# Patient Record
Sex: Female | Born: 1965
Health system: Southern US, Community
[De-identification: ages and names within clinical notes are randomized; demographics above are authoritative.]

## PROBLEM LIST (undated history)

## (undated) DIAGNOSIS — S83519A Sprain of anterior cruciate ligament of unspecified knee, initial encounter: Secondary | ICD-10-CM

## (undated) DIAGNOSIS — E785 Hyperlipidemia, unspecified: Secondary | ICD-10-CM

## (undated) DIAGNOSIS — F419 Anxiety disorder, unspecified: Secondary | ICD-10-CM

## (undated) DIAGNOSIS — Z98811 Dental restoration status: Secondary | ICD-10-CM

## (undated) HISTORY — PX: NASAL RECONSTRUCTION WITH SEPTAL REPAIR: SHX5665

## (undated) HISTORY — DX: Anxiety disorder, unspecified: F41.9

## (undated) HISTORY — PX: STRABISMUS SURGERY: SHX218

## (undated) HISTORY — PX: ABDOMINAL HYSTERECTOMY: SHX81

---

## 1998-04-13 ENCOUNTER — Emergency Department (HOSPITAL_COMMUNITY): Admission: EM | Admit: 1998-04-13 | Discharge: 1998-04-13 | Payer: Self-pay | Admitting: Emergency Medicine

## 1999-03-12 ENCOUNTER — Other Ambulatory Visit: Admission: RE | Admit: 1999-03-12 | Discharge: 1999-03-12 | Payer: Self-pay | Admitting: Urology

## 1999-07-07 ENCOUNTER — Ambulatory Visit (HOSPITAL_COMMUNITY): Admission: RE | Admit: 1999-07-07 | Discharge: 1999-07-07 | Payer: Self-pay | Admitting: Obstetrics and Gynecology

## 1999-07-07 ENCOUNTER — Encounter: Payer: Self-pay | Admitting: Obstetrics and Gynecology

## 1999-08-04 ENCOUNTER — Other Ambulatory Visit: Admission: RE | Admit: 1999-08-04 | Discharge: 1999-08-04 | Payer: Self-pay | Admitting: Obstetrics and Gynecology

## 2000-06-30 ENCOUNTER — Other Ambulatory Visit: Admission: RE | Admit: 2000-06-30 | Discharge: 2000-06-30 | Payer: Self-pay | Admitting: Obstetrics and Gynecology

## 2000-12-28 ENCOUNTER — Encounter (INDEPENDENT_AMBULATORY_CARE_PROVIDER_SITE_OTHER): Payer: Self-pay | Admitting: Specialist

## 2000-12-28 ENCOUNTER — Ambulatory Visit (HOSPITAL_COMMUNITY): Admission: RE | Admit: 2000-12-28 | Discharge: 2000-12-28 | Payer: Self-pay | Admitting: Urology

## 2001-07-07 ENCOUNTER — Other Ambulatory Visit: Admission: RE | Admit: 2001-07-07 | Discharge: 2001-07-07 | Payer: Self-pay | Admitting: Obstetrics and Gynecology

## 2002-06-01 ENCOUNTER — Ambulatory Visit (HOSPITAL_COMMUNITY): Admission: RE | Admit: 2002-06-01 | Discharge: 2002-06-01 | Payer: Self-pay | Admitting: Obstetrics and Gynecology

## 2002-06-01 ENCOUNTER — Encounter: Payer: Self-pay | Admitting: Obstetrics and Gynecology

## 2002-07-21 ENCOUNTER — Other Ambulatory Visit: Admission: RE | Admit: 2002-07-21 | Discharge: 2002-07-21 | Payer: Self-pay | Admitting: Obstetrics and Gynecology

## 2003-01-26 ENCOUNTER — Encounter: Admission: RE | Admit: 2003-01-26 | Discharge: 2003-03-02 | Payer: Self-pay | Admitting: Orthopedic Surgery

## 2003-02-11 ENCOUNTER — Encounter: Payer: Self-pay | Admitting: Orthopedic Surgery

## 2003-02-11 ENCOUNTER — Ambulatory Visit (HOSPITAL_COMMUNITY): Admission: RE | Admit: 2003-02-11 | Discharge: 2003-02-11 | Payer: Self-pay | Admitting: Orthopedic Surgery

## 2003-04-11 ENCOUNTER — Encounter: Payer: Self-pay | Admitting: Emergency Medicine

## 2003-04-11 ENCOUNTER — Emergency Department (HOSPITAL_COMMUNITY): Admission: EM | Admit: 2003-04-11 | Discharge: 2003-04-11 | Payer: Self-pay | Admitting: *Deleted

## 2003-06-25 ENCOUNTER — Encounter (INDEPENDENT_AMBULATORY_CARE_PROVIDER_SITE_OTHER): Payer: Self-pay | Admitting: Specialist

## 2003-06-25 ENCOUNTER — Inpatient Hospital Stay (HOSPITAL_COMMUNITY): Admission: RE | Admit: 2003-06-25 | Discharge: 2003-06-27 | Payer: Self-pay | Admitting: Obstetrics and Gynecology

## 2003-06-25 HISTORY — PX: TOTAL ABDOMINAL HYSTERECTOMY W/ BILATERAL SALPINGOOPHORECTOMY: SHX83

## 2003-08-28 ENCOUNTER — Encounter: Admission: RE | Admit: 2003-08-28 | Discharge: 2003-08-28 | Payer: Self-pay | Admitting: Obstetrics and Gynecology

## 2003-09-03 ENCOUNTER — Encounter (HOSPITAL_COMMUNITY): Admission: RE | Admit: 2003-09-03 | Discharge: 2003-09-03 | Payer: Self-pay | Admitting: Obstetrics and Gynecology

## 2004-01-13 ENCOUNTER — Emergency Department (HOSPITAL_COMMUNITY): Admission: EM | Admit: 2004-01-13 | Discharge: 2004-01-13 | Payer: Self-pay | Admitting: *Deleted

## 2004-10-13 ENCOUNTER — Emergency Department (HOSPITAL_COMMUNITY): Admission: EM | Admit: 2004-10-13 | Discharge: 2004-10-13 | Payer: Self-pay | Admitting: Emergency Medicine

## 2004-10-27 ENCOUNTER — Encounter: Admission: RE | Admit: 2004-10-27 | Discharge: 2004-10-27 | Payer: Self-pay | Admitting: Obstetrics and Gynecology

## 2004-11-04 ENCOUNTER — Other Ambulatory Visit: Admission: RE | Admit: 2004-11-04 | Discharge: 2004-11-04 | Payer: Self-pay | Admitting: Obstetrics and Gynecology

## 2005-10-28 ENCOUNTER — Encounter: Admission: RE | Admit: 2005-10-28 | Discharge: 2005-10-28 | Payer: Self-pay | Admitting: Occupational Medicine

## 2005-10-29 ENCOUNTER — Ambulatory Visit (HOSPITAL_COMMUNITY): Admission: RE | Admit: 2005-10-29 | Discharge: 2005-10-29 | Payer: Self-pay | Admitting: Obstetrics and Gynecology

## 2005-11-09 ENCOUNTER — Other Ambulatory Visit: Admission: RE | Admit: 2005-11-09 | Discharge: 2005-11-09 | Payer: Self-pay | Admitting: Obstetrics and Gynecology

## 2006-02-26 ENCOUNTER — Emergency Department (HOSPITAL_COMMUNITY): Admission: EM | Admit: 2006-02-26 | Discharge: 2006-02-26 | Payer: Self-pay | Admitting: Emergency Medicine

## 2006-09-08 ENCOUNTER — Encounter: Admission: RE | Admit: 2006-09-08 | Discharge: 2006-09-08 | Payer: Self-pay | Admitting: Obstetrics and Gynecology

## 2006-09-15 ENCOUNTER — Ambulatory Visit (HOSPITAL_COMMUNITY): Admission: RE | Admit: 2006-09-15 | Discharge: 2006-09-15 | Payer: Self-pay | Admitting: Obstetrics and Gynecology

## 2006-11-11 ENCOUNTER — Other Ambulatory Visit: Admission: RE | Admit: 2006-11-11 | Discharge: 2006-11-11 | Payer: Self-pay | Admitting: Obstetrics and Gynecology

## 2007-01-30 ENCOUNTER — Emergency Department (HOSPITAL_COMMUNITY): Admission: EM | Admit: 2007-01-30 | Discharge: 2007-01-30 | Payer: Self-pay | Admitting: Emergency Medicine

## 2007-05-13 ENCOUNTER — Ambulatory Visit (HOSPITAL_COMMUNITY): Admission: RE | Admit: 2007-05-13 | Discharge: 2007-05-13 | Payer: Self-pay | Admitting: Family Medicine

## 2007-06-03 ENCOUNTER — Ambulatory Visit (HOSPITAL_COMMUNITY): Admission: RE | Admit: 2007-06-03 | Discharge: 2007-06-03 | Payer: Self-pay | Admitting: Family Medicine

## 2007-06-06 ENCOUNTER — Emergency Department (HOSPITAL_COMMUNITY): Admission: EM | Admit: 2007-06-06 | Discharge: 2007-06-06 | Payer: Self-pay | Admitting: Family Medicine

## 2007-11-02 ENCOUNTER — Encounter: Admission: RE | Admit: 2007-11-02 | Discharge: 2007-11-02 | Payer: Self-pay | Admitting: Obstetrics and Gynecology

## 2007-11-11 ENCOUNTER — Encounter: Admission: RE | Admit: 2007-11-11 | Discharge: 2007-11-11 | Payer: Self-pay | Admitting: Obstetrics and Gynecology

## 2007-11-16 ENCOUNTER — Other Ambulatory Visit: Admission: RE | Admit: 2007-11-16 | Discharge: 2007-11-16 | Payer: Self-pay | Admitting: Obstetrics and Gynecology

## 2007-11-30 ENCOUNTER — Ambulatory Visit (HOSPITAL_COMMUNITY): Admission: RE | Admit: 2007-11-30 | Discharge: 2007-11-30 | Payer: Self-pay | Admitting: Obstetrics and Gynecology

## 2008-12-03 ENCOUNTER — Ambulatory Visit: Payer: Self-pay | Admitting: Obstetrics and Gynecology

## 2008-12-03 ENCOUNTER — Encounter: Payer: Self-pay | Admitting: Obstetrics and Gynecology

## 2008-12-03 ENCOUNTER — Other Ambulatory Visit: Admission: RE | Admit: 2008-12-03 | Discharge: 2008-12-03 | Payer: Self-pay | Admitting: Obstetrics and Gynecology

## 2008-12-03 ENCOUNTER — Encounter: Admission: RE | Admit: 2008-12-03 | Discharge: 2008-12-03 | Payer: Self-pay | Admitting: Obstetrics and Gynecology

## 2008-12-07 ENCOUNTER — Encounter: Admission: RE | Admit: 2008-12-07 | Discharge: 2008-12-07 | Payer: Self-pay | Admitting: Obstetrics and Gynecology

## 2009-04-02 ENCOUNTER — Encounter (INDEPENDENT_AMBULATORY_CARE_PROVIDER_SITE_OTHER): Payer: Self-pay | Admitting: Gastroenterology

## 2009-04-02 ENCOUNTER — Ambulatory Visit (HOSPITAL_COMMUNITY): Admission: RE | Admit: 2009-04-02 | Discharge: 2009-04-02 | Payer: Self-pay | Admitting: Gastroenterology

## 2009-06-02 ENCOUNTER — Emergency Department (HOSPITAL_COMMUNITY): Admission: EM | Admit: 2009-06-02 | Discharge: 2009-06-02 | Payer: Self-pay | Admitting: Family Medicine

## 2009-10-09 ENCOUNTER — Encounter: Admission: RE | Admit: 2009-10-09 | Discharge: 2009-10-09 | Payer: Self-pay | Admitting: Obstetrics and Gynecology

## 2009-10-09 ENCOUNTER — Emergency Department (HOSPITAL_COMMUNITY): Admission: EM | Admit: 2009-10-09 | Discharge: 2009-10-09 | Payer: Self-pay | Admitting: Family Medicine

## 2009-12-04 ENCOUNTER — Ambulatory Visit: Payer: Self-pay | Admitting: Obstetrics and Gynecology

## 2009-12-04 ENCOUNTER — Other Ambulatory Visit: Admission: RE | Admit: 2009-12-04 | Discharge: 2009-12-04 | Payer: Self-pay | Admitting: Obstetrics and Gynecology

## 2009-12-06 ENCOUNTER — Ambulatory Visit: Payer: Self-pay | Admitting: Obstetrics and Gynecology

## 2010-01-15 ENCOUNTER — Ambulatory Visit: Payer: Self-pay | Admitting: Obstetrics and Gynecology

## 2010-07-13 ENCOUNTER — Encounter: Payer: Self-pay | Admitting: Obstetrics and Gynecology

## 2010-07-14 ENCOUNTER — Encounter: Payer: Self-pay | Admitting: Obstetrics and Gynecology

## 2010-11-07 NOTE — Discharge Summary (Signed)
Nicole Oliver, Nicole Oliver                          ACCOUNT NO.:  [id number]   MEDICAL RECORD NO.:  [id number]                   PATIENT TYPE:  INP   LOCATION:  9136                                 FACILITY:  [facility]   PHYSICIAN:  Daniel L. Eda Paschal, M.D.           DATE OF BIRTH:  01-22-1966   DATE OF ADMISSION:  06/25/2003  DATE OF DISCHARGE:  06/27/2003                                 DISCHARGE SUMMARY   DISCHARGE DIAGNOSES:  1. Fibroids.  2. Menorrhagia.  3. Right adnexal mass.  4. Endometriosis.  5. Status post total abdominal hysterectomy with bilateral salpingo-     oophorectomy with Dr. Edyth Gunnels on June 25, 2003.   HISTORY:  This is a 37-years-of-age female gravida 0 who presented to the  office in November 2004 with a history of abdominal bloating pain, extremely  heavy period.  Examination revealed she had fibroids which had appeared to  be enlarged on previous exam and a complex right adnexal mass of 2.9 x 2.4 x  2.2 cm that was cystic and solid.  CA125 was noted to be normal.  Because of  her complex mass and her family history of ovarian cancer as pain and  menorrhagia with fibroids she entered the hospital for definitive surgery.  In addition, because of her family history of ovarian cancer she elected to  have a bilateral salpingo-oophorectomy rather than just an ovarian  cystectomy.  Her family history note was significant for that her mother,  sister, and maternal aunt have all had breast cancer and her mother had also  had ovarian cancer.   HOSPITAL COURSE:  On June 25, 2003 the patient underwent a total abdominal  hysterectomy and bilateral salpingo-oophorectomy and it was noted that the  patient's uterus was enlarged with multiple fibroids.  One of them extended  into the area of the adnexa which may have been confused for an adnexal mass  on ultrasound.  It was noted there was endometriosis on the peritoneum.  Postoperatively the patient remained afebrile,  voiding, in stable condition.  She was discharged to home on June 27, 2003 and given discharge  instructions.   ACCESSORY CLINICAL FINDINGS/LABORATORY DATA:  On admission on June 21, 2003 hemoglobin was 13.6.   DISPOSITION:  1. The patient is discharged to home.  2. Given Darvocet p.r.n. pain.  3. Given Climara patch.  4. She was to follow up in the office on June 29, 2003 for staple removal.     Susa Loffler, P.A.                    Daniel L. Eda Paschal, M.D.    Ardath Sax  D:  07/23/2003  T:  07/23/2003  Job:  [id number]

## 2010-11-07 NOTE — H&P (Signed)
Nicole Oliver, Nicole Oliver                          ACCOUNT NO.:  [id number]   MEDICAL RECORD NO.:  [id number]                   PATIENT TYPE:  INP   LOCATION:                                       [facility]  [facility]   PHYSICIAN:  Daniel L. Eda Paschal, M.D.           DATE OF BIRTH:  [birth year]   DATE OF ADMISSION:  DATE OF DISCHARGE:                                HISTORY & PHYSICAL   CHIEF COMPLAINT:  Symptomatic fibroids with right adnexal mass.   HISTORY OF PRESENT ILLNESS:  The patient is a 45 year old nulla gravida who  presented to the office in November 2004, with a history of  abdominal  bloating, pelvic pain and extremely heavy periods. On examination  her  fibroids, which had been present previously, appeared  to be enlarged, and  in addition she had a complex right adnexal mass of about 2.9 x 2.4 x 2.2  cm. It is both cystic and solid. A CA-125 was done which was normal.  However, because of this complex mass and her family history of ovarian  cancer as well as pain and menorrhagia from her fibroids, she now enters the  hospital  for surgery. The patient has decided she does not want to have any  children at this point and would like to proceed with definitive surgery  because of the above.   In addition because of her family history of ovarian cancer, she has elected  to have a bilateral salpingo-oophorectomy rather than just having the  ovarian cyst removed. She will therefore undergo total abdominal  hysterectomy and bilateral salpingo-oophorectomy. She appreciates the issues  and need for hormone replacement therapy postoperatively.   CURRENT MEDICATIONS:  Birth control pills and Lexapro.   ALLERGIES:  CODEINE AND SULFA.   PAST SURGICAL HISTORY:  She has had no surgery except for a nasal septum  reconstruction and eye surgery.   FAMILY HISTORY:  Her maternal grandmother has hypertension. Her mother,  sister and maternal aunt have all had breast cancer. Her mother has had  ovarian cancer.   SOCIAL HISTORY:  She used to smoke but has given it up. She does not drink.   REVIEW OF SYSTEMS:  HEENT:  Negative.  CARDIAC:  Negative. RESPIRATORY:  Negative. GI:  Negative. GU:  Negative. NEUROMUSCULAR:  Negative. ENDOCRINE:  Negative.   PHYSICAL EXAMINATION:  GENERAL:  The patient is a well developed, well  nourished female in no acute distress.  VITAL SIGNS:  Blood pressure 130/70, pulse 80 and regular, respirations 16  and unlabored, afebrile.  HEENT:  All within normal limits.  NECK:  Supple, trachea was midline, thyroid not enlarged.  LUNGS:  Clear to auscultation and percussion.  HEART:  No thrills, heaves or murmurs.  BREASTS:  No masses.  ABDOMEN:  Soft without rebound or guarding or masses.  PELVIC:  External is normal. BUS is normal. Vaginal is normal. Cervix is  clean.  Pap smear shows no atypia. The uterus is enlarged by fibroids to 10  to 11 week size. Adnexa reveals a right adnexal enlargement of 4+ cm. The  left adnexa is negative. Rectovaginal is confirmatory.   IMPRESSION:  1. Symptomatic fibroids.  2. Complex right ovarian mass.  3. Strong family history of both breast and ovarian cancer.   PLAN:  Total abdominal hysterectomy and bilateral salpingo-oophorectomy for  above.                                               Daniel L. Eda Paschal, M.D.    Tonette Bihari  D:  06/24/2003  T:  06/24/2003  Job:  [id number]

## 2010-11-07 NOTE — Op Note (Signed)
Nicole Oliver, Nicole Oliver                          ACCOUNT NO.:  [id number]   MEDICAL RECORD NO.:  [id number]                   PATIENT TYPE:  INP   LOCATION:  9136                                 FACILITY:  [facility]   PHYSICIAN:  Daniel L. Eda Paschal, M.D.           DATE OF BIRTH:  30-Jul-1965   DATE OF PROCEDURE:  06/25/2003  DATE OF DISCHARGE:                                 OPERATIVE REPORT   PREOPERATIVE DIAGNOSES:  1. Fibroids.  2. Menorrhagia.  3. Right adnexal mass.   POSTOPERATIVE DIAGNOSES:  1. Fibroids.  2. Menorrhagia.  3. Endometriosis.   PROCEDURE:  Total abdominal hysterectomy and bilateral salpingo-  oophorectomy.   SURGEON:  Daniel L. Eda Paschal, M.D.   FIRST ASSISTANT:  Timothy P. Fontaine, M.D.   FINDINGS:  At the time of surgery, the patient's uterus was enlarged by  multiple fibroids.  One of them extended into the area of the right adnexa  and may have been confused for an adnexal mass on ultrasound.  Ovaries and  fallopian tubes themselves were normal.  Pelvic peritoneum was free of  disease, except the vesicouterine fold.  The peritoneum had some fairly  significant endometriosis.  Ileocecal junction was identified, appendix was  normal.  Exploration of the upper abdomen was normal.   DESCRIPTION OF PROCEDURE:  After adequate general endotracheal anesthesia,  the patient was placed in the supine position, prepped and draped in usual  sterile manner.  Foley catheter was returned to the patient's bladder.  A  Pfannenstiel incision was made.  The fascia was opened transversely.  The  perineum was entered vertically. Subcutaneous bleeders were clamped and  bovied as encountered.  When the peritoneal cavity was opened, the above  findings were noted.  The first perineal washings were obtained because of  her mother's history of ovarian cancer.  Then the adnexa were inspected and  what was felt to be a preoperative adnexal mass was gone.  It either  disappeared  since the ultrasound or it was confused with a fibroid that  extended into the right adnexa.  The decision was still to remove both  ovaries because of her mother's history of ovarian cancer plus an extremely  strong history of breast cancer. The round ligaments were bovied and cut.  The ureters were identified.  The IP ligaments were then clamped, cut, and  doubly suture ligated with #1 chromic catgut.  Posterior peritoneum and  vesicouterine fold of peritoneum were sharply dissected free.  The uterine  arteries were clamped, cut, and doubly suture ligated with #1 chromic  catgut.  The parametrium was taken down successive bites with clamping,  cutting and suture ligating with #1 chromic catgut.  Cervicovaginal junction  was identified, entered with sharp dissection.  Uterus, ovaries and tubes  were sent to pathology for tissue diagnosis with special instructions to  pathologist to serial cut her ovaries.  Angle sutures were placed in the  angles of the vagina incorporating the parametrium and uterosacral ligaments  with good vault support and then the cuff was closed with figure-of-eights  of 0 Vicryl.  Two Sponge and needle counts were correct.  The peritoneal  cavity was irrigated with copious amount of Ringers lactate which was then  removed.  The peritoneum was closed with a running 0 Vicryl. Subfascial and  superfascial spaces were copiously irrigated with  sterile saline and then the fascia was closed with two running 0 Vicryl.  The skin was closed with staples.  Estimated blood loss for the entire  procedure was 100 mL with none replaced.  The patient tolerated the  procedure well and left the operating room in satisfactory condition  draining clear urine from her Foley catheter.                                               Daniel L. Eda Paschal, M.D.    Tonette Bihari  D:  06/25/2003  T:  06/25/2003  Job:  [id number]

## 2011-01-05 ENCOUNTER — Other Ambulatory Visit: Payer: Self-pay | Admitting: Obstetrics and Gynecology

## 2011-01-05 DIAGNOSIS — Z1231 Encounter for screening mammogram for malignant neoplasm of breast: Secondary | ICD-10-CM

## 2011-01-12 ENCOUNTER — Ambulatory Visit: Payer: Self-pay

## 2011-01-19 ENCOUNTER — Ambulatory Visit
Admission: RE | Admit: 2011-01-19 | Discharge: 2011-01-19 | Disposition: A | Discharge status: Home / Self Care | Payer: Commercial Managed Care - PPO | Source: Ambulatory Visit | Attending: Obstetrics and Gynecology | Admitting: Obstetrics and Gynecology

## 2011-01-19 DIAGNOSIS — Z1231 Encounter for screening mammogram for malignant neoplasm of breast: Secondary | ICD-10-CM

## 2011-01-21 ENCOUNTER — Encounter: Payer: Self-pay | Admitting: Gynecology

## 2011-01-28 ENCOUNTER — Encounter: Payer: Self-pay | Admitting: Obstetrics and Gynecology

## 2011-01-28 ENCOUNTER — Other Ambulatory Visit (HOSPITAL_COMMUNITY)
Admission: RE | Admit: 2011-01-28 | Discharge: 2011-01-28 | Disposition: A | Discharge status: Home / Self Care | Payer: 59 | Source: Ambulatory Visit | Attending: Obstetrics and Gynecology | Admitting: Obstetrics and Gynecology

## 2011-01-28 ENCOUNTER — Ambulatory Visit (INDEPENDENT_AMBULATORY_CARE_PROVIDER_SITE_OTHER): Payer: Commercial Managed Care - PPO | Admitting: Obstetrics and Gynecology

## 2011-01-28 VITALS — BP 120/74 | Ht 64.0 in | Wt 175.0 lb

## 2011-01-28 DIAGNOSIS — Z780 Asymptomatic menopausal state: Secondary | ICD-10-CM

## 2011-01-28 DIAGNOSIS — B3731 Acute candidiasis of vulva and vagina: Secondary | ICD-10-CM

## 2011-01-28 DIAGNOSIS — N951 Menopausal and female climacteric states: Secondary | ICD-10-CM

## 2011-01-28 DIAGNOSIS — Z01419 Encounter for gynecological examination (general) (routine) without abnormal findings: Secondary | ICD-10-CM | POA: Insufficient documentation

## 2011-01-28 DIAGNOSIS — B373 Candidiasis of vulva and vagina: Secondary | ICD-10-CM

## 2011-01-28 DIAGNOSIS — N898 Other specified noninflammatory disorders of vagina: Secondary | ICD-10-CM

## 2011-01-28 MED ORDER — FLUCONAZOLE 200 MG PO TABS: 200.0000 mg | ORAL_TABLET | Freq: Every day | ORAL | Status: DC

## 2011-01-28 MED ORDER — ESTRADIOL 0.1 MG/24HR TD PTTW: 1.0000 | MEDICATED_PATCH | TRANSDERMAL | Status: DC

## 2011-01-28 NOTE — Progress Notes (Signed)
The patient came to see me today for her annual gynecological exam. She remains on hormone replacement with excellent results. She is up-to-date on mammograms and bone densities. She does her lab work from her PCP. She is aware of a vaginal discharge.  Physical examination: HEENT within normal limits. Neck: Thyroid not large. No masses. Supraclavicular nodes: not enlarged. Breasts: Examined in both sitting midline position. No skin changes and no masses. Abdomen: Soft no guarding rebound or masses or hernia. Pelvic: External: Within normal limits. BUS: Within normal limits. Vaginal:within normal limits. Positive wet prep for yeast. Good estrogen effect. No evidence of cystocele rectocele or enterocele. Cervixand uterus absent.. Adnexa: No masses. Rectovaginal exam: Confirmatory and negative. Extremities: Within normal limits.  Assessment: 1. Menopausal symptoms 2. Yeast vaginitis  Plan: 1. To reduce risk of DVT we switched her to Vivelle dot patch 0.1 mg BIW  2. We treated her with Diflucan 200 mg tablets daily for 4 days.

## 2011-02-06 ENCOUNTER — Ambulatory Visit: Payer: PRIVATE HEALTH INSURANCE | Attending: Family Medicine | Admitting: Occupational Therapy

## 2011-02-06 DIAGNOSIS — M2550 Pain in unspecified joint: Secondary | ICD-10-CM | POA: Insufficient documentation

## 2011-02-06 DIAGNOSIS — R209 Unspecified disturbances of skin sensation: Secondary | ICD-10-CM | POA: Insufficient documentation

## 2011-02-06 DIAGNOSIS — IMO0001 Reserved for inherently not codable concepts without codable children: Secondary | ICD-10-CM | POA: Insufficient documentation

## 2011-02-06 DIAGNOSIS — M6281 Muscle weakness (generalized): Secondary | ICD-10-CM | POA: Insufficient documentation

## 2011-02-06 DIAGNOSIS — M25649 Stiffness of unspecified hand, not elsewhere classified: Secondary | ICD-10-CM | POA: Insufficient documentation

## 2011-02-06 DIAGNOSIS — G5600 Carpal tunnel syndrome, unspecified upper limb: Secondary | ICD-10-CM | POA: Insufficient documentation

## 2011-02-06 DIAGNOSIS — M25549 Pain in joints of unspecified hand: Secondary | ICD-10-CM | POA: Insufficient documentation

## 2011-02-09 ENCOUNTER — Encounter: Payer: Self-pay | Admitting: Occupational Therapy

## 2011-02-13 ENCOUNTER — Telehealth: Payer: Self-pay | Admitting: *Deleted

## 2011-02-13 ENCOUNTER — Ambulatory Visit: Payer: PRIVATE HEALTH INSURANCE | Attending: Family Medicine | Admitting: Occupational Therapy

## 2011-02-13 DIAGNOSIS — M25549 Pain in joints of unspecified hand: Secondary | ICD-10-CM | POA: Insufficient documentation

## 2011-02-13 DIAGNOSIS — M6281 Muscle weakness (generalized): Secondary | ICD-10-CM | POA: Insufficient documentation

## 2011-02-13 DIAGNOSIS — IMO0001 Reserved for inherently not codable concepts without codable children: Secondary | ICD-10-CM | POA: Insufficient documentation

## 2011-02-13 DIAGNOSIS — G5600 Carpal tunnel syndrome, unspecified upper limb: Secondary | ICD-10-CM | POA: Insufficient documentation

## 2011-02-13 DIAGNOSIS — M2550 Pain in unspecified joint: Secondary | ICD-10-CM | POA: Insufficient documentation

## 2011-02-13 DIAGNOSIS — M25649 Stiffness of unspecified hand, not elsewhere classified: Secondary | ICD-10-CM | POA: Insufficient documentation

## 2011-02-13 DIAGNOSIS — R209 Unspecified disturbances of skin sensation: Secondary | ICD-10-CM | POA: Insufficient documentation

## 2011-02-13 MED ORDER — ESTRADIOL 0.05 MG/24HR TD PTTW: 1.0000 | MEDICATED_PATCH | TRANSDERMAL | Status: DC

## 2011-02-13 NOTE — Telephone Encounter (Signed)
Patient said she would like to try the 0.05 strength.  Needs a 90 day rx if that's ok. [place]

## 2011-02-13 NOTE — Telephone Encounter (Signed)
Patient called to say that the Vivelle patch was too strong for her, and she has stopped it and gone back on the oral meds she was previously on.  She said if you would prefer for her to go back on a patch, she would need a lot lower dose. She would prefer to stay on pill, but will do what you advise.

## 2011-02-13 NOTE — Telephone Encounter (Signed)
I think it her age is except of would go back on the oral estrogen. The dose was estradiol 1.5 mg daily. There are lower doses of the patch however. And I would be willing to lower her dose if she would like to try the patch again.

## 2011-02-13 NOTE — Telephone Encounter (Signed)
Lm for patient Per DG ok to change.  Escribed rx.

## 2011-02-16 NOTE — Progress Notes (Signed)
  PER FAX FROM [facility]. IS SIG ON VIVELLE DOT RX SUPPOSED TO BE TWICE WEEKLY. I VERIFIED WITH AMY HAZELWOOD IT IS 2 TIMES WEEKLY. CALLED TO STEVE AT THE PHARMACY.

## 2011-02-25 ENCOUNTER — Encounter: Payer: Self-pay | Admitting: Occupational Therapy

## 2011-02-27 ENCOUNTER — Encounter: Payer: Self-pay | Admitting: Occupational Therapy

## 2011-03-04 ENCOUNTER — Encounter: Payer: Self-pay | Admitting: Occupational Therapy

## 2011-03-06 ENCOUNTER — Encounter: Payer: Self-pay | Admitting: Occupational Therapy

## 2011-03-11 ENCOUNTER — Encounter: Payer: Self-pay | Admitting: Occupational Therapy

## 2011-03-13 ENCOUNTER — Encounter: Payer: Self-pay | Admitting: Occupational Therapy

## 2011-03-27 LAB — POCT URINALYSIS DIP (DEVICE)
Glucose, UA: NEGATIVE
Operator id: 235561

## 2011-07-13 ENCOUNTER — Other Ambulatory Visit: Payer: Self-pay | Admitting: Obstetrics and Gynecology

## 2012-02-09 ENCOUNTER — Other Ambulatory Visit: Payer: Self-pay | Admitting: Obstetrics and Gynecology

## 2012-02-09 DIAGNOSIS — Z1231 Encounter for screening mammogram for malignant neoplasm of breast: Secondary | ICD-10-CM

## 2012-03-03 ENCOUNTER — Ambulatory Visit
Admission: RE | Admit: 2012-03-03 | Discharge: 2012-03-03 | Disposition: A | Discharge status: Home / Self Care | Payer: PRIVATE HEALTH INSURANCE | Source: Ambulatory Visit | Attending: Obstetrics and Gynecology | Admitting: Obstetrics and Gynecology

## 2012-03-03 DIAGNOSIS — Z1231 Encounter for screening mammogram for malignant neoplasm of breast: Secondary | ICD-10-CM

## 2012-03-08 ENCOUNTER — Encounter: Payer: Self-pay | Admitting: Obstetrics and Gynecology

## 2012-03-08 ENCOUNTER — Ambulatory Visit (INDEPENDENT_AMBULATORY_CARE_PROVIDER_SITE_OTHER): Payer: 59 | Admitting: Obstetrics and Gynecology

## 2012-03-08 VITALS — BP 138/78 | Ht 64.0 in | Wt 175.0 lb

## 2012-03-08 DIAGNOSIS — Z01419 Encounter for gynecological examination (general) (routine) without abnormal findings: Secondary | ICD-10-CM

## 2012-03-08 MED ORDER — ESTRADIOL 0.075 MG/24HR TD PTTW: 1.0000 | MEDICATED_PATCH | TRANSDERMAL | Status: DC

## 2012-03-08 MED ORDER — NITROFURANTOIN MACROCRYSTAL 50 MG PO CAPS: 50.0000 mg | ORAL_CAPSULE | ORAL | Status: DC | PRN

## 2012-03-08 MED ORDER — FLUCONAZOLE 200 MG PO TABS: 200.0000 mg | ORAL_TABLET | Freq: Every day | ORAL | Status: DC

## 2012-03-08 NOTE — Progress Notes (Signed)
Patient came to see me today for her annual GYN exam. In 2005 she had a total abdominal hysterectomy, bilateral salpingo-oophorectomy for endometriosis and fibroids. She has a very strong family history of early onset breast cancer( see Family history). Prior to her hysterectomy we had scheduled BRCA1 and BRCA2 testing. Patient declined for a variety of reasons and just want her ovaries out. She does not have any children so that was nonissue either. She is currently on a Vivelle dot patch. Initially she used to 0.1 mg patch but found she was very aggressive on it. She is now on a 0.05 mg patch and actually wears 2 patches at the same time for 2 weeks and that seems to work well for her. She does have some vaginal dryness and irritation after intercourse. She uses postcoital Macrodantin to prevent UTIs. She uses Diflucan occasionally for yeast vaginitis. She just had her mammogram at the breast Center. They advised her about periodic MRIs of her breasts. She does her lab through her PCP. She had a normal bone density in 2011.  HEENT: Within normal limits.Leonard Schwartz present. Neck: No masses. Supraclavicular lymph nodes: Not enlarged. Breasts: Examined in both sitting and lying position. Symmetrical without skin changes or masses. Abdomen: Soft no masses guarding or rebound. No hernias. Pelvic: External within normal limits. BUS within normal limits. Vaginal examination shows good estrogen effect, no cystocele enterocele or rectocele. Cervix and uterus absent. Adnexa within normal limits. Rectovaginal confirmatory. Extremities within normal limits.  Assessment: Menopausal symptoms. UTIs. Yeast vaginitis. Strong family history of early onset breast cancer.  Plan: We switched her to a Vivelle dot patch 0.075 mg that she will use twice a week. Discussed vaginal estrogen. Refilled Macrodantin and Diflucan. We discussed BRCA1 and BRCA2 testing. Without her ovaries or  children indications are certainly  less certain. Patient declined. Patient has never had an abnormal Pap smear. Last Pap smear was 2012. Discussed new guidelines. No Pap done.

## 2012-03-08 NOTE — Patient Instructions (Signed)
Continue yearly mammograms. Followup MRI of her breasts as per breast  center recommendations.

## 2012-03-09 LAB — URINALYSIS W MICROSCOPIC + REFLEX CULTURE
Bacteria, UA: NONE SEEN
Casts: NONE SEEN
Glucose, UA: NEGATIVE mg/dL
Hgb urine dipstick: NEGATIVE
Ketones, ur: NEGATIVE mg/dL
Leukocytes, UA: NEGATIVE
Nitrite: NEGATIVE
Specific Gravity, Urine: 1.015 (ref 1.005–1.030)

## 2012-03-11 ENCOUNTER — Encounter: Payer: Self-pay | Admitting: Obstetrics and Gynecology

## 2012-04-13 ENCOUNTER — Ambulatory Visit (INDEPENDENT_AMBULATORY_CARE_PROVIDER_SITE_OTHER): Payer: 59 | Admitting: Family Medicine

## 2012-04-13 VITALS — BP 116/64 | Ht 65.0 in | Wt 165.0 lb

## 2012-04-13 DIAGNOSIS — M79646 Pain in unspecified finger(s): Secondary | ICD-10-CM

## 2012-04-13 DIAGNOSIS — M79609 Pain in unspecified limb: Secondary | ICD-10-CM

## 2012-04-13 NOTE — Patient Instructions (Addendum)
Thank you for coming in today. I think your pain is from typing.  Try avoiding using your pinkey with typing.  2 aleve twice a day for pain as needed.  Try aspercream over the pinkey joint.  See me in 4 weeks so.

## 2012-04-14 NOTE — Assessment & Plan Note (Signed)
I believe this is occupational finger pain.  She does a great deal of typing and is likely causing irritation of the PIP joints of the fingers bilaterally. Her forearm rotation is likely related to extensor digiti minimi muscle or tendon pain which is extension of the same problem. Plan: Oral NSAIDs as needed, topical NSAIDs, compression garment to the right forearm (will order the left if she likes the right), trial of avoiding using her pinky with typing for 2 weeks  Additionally we'll obtain x-rays of both hands to evaluate for erosive arthritis or other issues.  Followup in 4 weeks.

## 2012-04-14 NOTE — Progress Notes (Signed)
Nicole Oliver is a 46 y.o. female who presents to [facility] today for bilateral fifth finger pain associated with bilateral ulnar forearm pain.  Right worse the left present for about 6 months. No neck pain radiating pain weakness or numbness.  No injury. Patient additionally notes a bump on the PIP joint of the fifth finger bilaterally.  This is mildly tender.  Ibuprofen is mildly helpful.  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  She does a great deal of typing.  She feels well otherwise without any radiating pain weakness or numbness.     PMH reviewed. Otherwise healthy History  Substance Use Topics  . Smoking status: Former Games developer  . Smokeless tobacco: Never Used  . Alcohol Use: No   ROS as above otherwise neg   Exam:  BP 116/64  Ht 5\' 5"  (1.651 m)  Wt 165 lb (74.844 kg)  BMI 27.46 kg/m2  LMP 06/23/2003 Gen: Well NAD MSK: Right hand: Prominence of the fifth PIP with mild radial deviation of the intermediate and distal phalanx.  Nontender normal strength. Normal sensation and capillary refill distally.  Left hand:  Prominence of the fifth PIP with mild radial deviation of the intermediate and distal phalanx.  Nontender normal strength. Normal sensation and capillary refill distally. Bilateral forearms: Normal-appearing nontender normal motion.   Neck: Nontender over spinal midline normal back motion negative Spurling's test bilaterally Shoulders bilaterally: Normal motion and strength.

## 2012-05-05 ENCOUNTER — Ambulatory Visit (HOSPITAL_COMMUNITY)
Admission: RE | Admit: 2012-05-05 | Discharge: 2012-05-05 | Disposition: A | Discharge status: Home / Self Care | Payer: PRIVATE HEALTH INSURANCE | Source: Ambulatory Visit | Attending: Sports Medicine | Admitting: Sports Medicine

## 2012-05-05 DIAGNOSIS — M79609 Pain in unspecified limb: Secondary | ICD-10-CM | POA: Insufficient documentation

## 2012-05-05 DIAGNOSIS — M79646 Pain in unspecified finger(s): Secondary | ICD-10-CM

## 2012-06-03 ENCOUNTER — Ambulatory Visit (INDEPENDENT_AMBULATORY_CARE_PROVIDER_SITE_OTHER): Payer: PRIVATE HEALTH INSURANCE | Admitting: Family Medicine

## 2012-06-03 ENCOUNTER — Encounter: Payer: Self-pay | Admitting: Family Medicine

## 2012-06-03 VITALS — BP 135/85 | HR 79 | Ht 65.0 in | Wt 165.0 lb

## 2012-06-03 DIAGNOSIS — M79609 Pain in unspecified limb: Secondary | ICD-10-CM

## 2012-06-03 DIAGNOSIS — M79646 Pain in unspecified finger(s): Secondary | ICD-10-CM

## 2012-06-03 NOTE — Progress Notes (Signed)
Nicole Oliver is a 46 y.o. female who presents to [facility] today for bilateral fifth PIP pain and lateral forearm pain.  Present for over one year now.  Patient underwent at the sports medicine clinic after failing conservative therapy with occupational therapy at occupational medicine clinic through [facility].  She has had a trial of compressive arm sleeves, bracing, topical NSAIDs, and oral NSAIDs, in addition to occupational therapy.  She continues to have pain in her bilateral PIP joints of the fifth fingers worse with typing better with rest.  She denies any injury neck pain radiating pain weakness or numbness.     PMH reviewed. Otherwise healthy History  Substance Use Topics  . Smoking status: Former Games developer  . Smokeless tobacco: Never Used  . Alcohol Use: No   ROS as above otherwise neg   Exam:  BP 135/85  Pulse 79  Ht 5\' 5"  (1.651 m)  Wt 165 lb (74.844 kg)  BMI 27.46 kg/m2  LMP 06/23/2003 Gen: Well NAD MSK: Right hand: Prominence of the fifth PIP with mild radial deviation of the intermediate and distal phalanx. Nontender normal strength. Normal sensation and capillary refill distally.  Left hand: Prominence of the fifth PIP with mild radial deviation of the intermediate and distal phalanx. Nontender normal strength. Normal sensation and capillary refill distally.  Bilateral forearms: Normal-appearing nontender normal motion.  Neck: Nontender over spinal midline normal back motion negative Spurling's test bilaterally  Shoulders bilaterally: Normal motion and strength.   Musculoskeletal ultrasound of the bilateral fifth digits: Volar bilaterally shows intact flexor tendons with normal-appearing PIP joints Dorsal bilaterally shows intact extensor tendons with normal-appearing PIP joints   Dg Hand Complete Left  05/05/2012  *RADIOLOGY REPORT*  Clinical Data: Left fifth PIP pain  LEFT HAND - COMPLETE 3+ VIEW  Comparison: Right hand radiographs 05/05/2012  Findings: Normal bony  mineralization and alignment.  No acute or healing fracture, joint space narrowing, osteophyte formation, or bony erosions are identified.  No focal soft tissue swelling or soft tissue calcification.  IMPRESSION: Negative.   Original Report Authenticated By: Britta Mccreedy, M.D.    Dg Hand Complete Right  05/05/2012  *RADIOLOGY REPORT*  Clinical Data: Left fifth PIP pain  RIGHT HAND - COMPLETE 3+ VIEW  Comparison: Left hand radiographs 05/05/2012  Findings:  Normal bony mineralization and alignment.  No acute or healing fracture, joint space narrowing, osteophyte formation, or bony erosions are identified.  No focal soft tissue swelling or soft tissue calcification.  IMPRESSION: Negative   Original Report Authenticated By: Britta Mccreedy, M.D.

## 2012-06-03 NOTE — Assessment & Plan Note (Signed)
Plan: Has failed conservative therapy.  I believe this is more occupational than anything else.  Plan: Like to refer to hand surgery for further evaluation and management. She may benefit from specialized splinting or possibly surgical fixation of her PIP joint.   F/u with [place] prn

## 2012-06-03 NOTE — Patient Instructions (Addendum)
Thank you for coming in today. I have run out of good ideas.  It is reasonable at this point to get you to the specialist.  In the mean time, continue the oral meds for pain.  Also try to type without using your pinkies.  Come back as needed.

## 2012-09-06 ENCOUNTER — Ambulatory Visit: Payer: 59 | Attending: Rheumatology | Admitting: Physical Therapy

## 2012-09-06 DIAGNOSIS — R293 Abnormal posture: Secondary | ICD-10-CM | POA: Insufficient documentation

## 2012-09-06 DIAGNOSIS — M25619 Stiffness of unspecified shoulder, not elsewhere classified: Secondary | ICD-10-CM | POA: Insufficient documentation

## 2012-09-06 DIAGNOSIS — M25519 Pain in unspecified shoulder: Secondary | ICD-10-CM | POA: Insufficient documentation

## 2012-09-12 ENCOUNTER — Ambulatory Visit: Payer: 59 | Admitting: Rehabilitation

## 2012-09-14 ENCOUNTER — Ambulatory Visit: Payer: 59 | Admitting: Rehabilitation

## 2012-09-19 ENCOUNTER — Ambulatory Visit: Payer: 59 | Admitting: Physical Therapy

## 2012-09-20 ENCOUNTER — Encounter: Payer: Self-pay | Admitting: Physical Therapy

## 2012-09-21 ENCOUNTER — Ambulatory Visit: Payer: 59 | Attending: Rheumatology | Admitting: Physical Therapy

## 2012-09-21 DIAGNOSIS — R293 Abnormal posture: Secondary | ICD-10-CM | POA: Insufficient documentation

## 2012-09-21 DIAGNOSIS — M25619 Stiffness of unspecified shoulder, not elsewhere classified: Secondary | ICD-10-CM | POA: Insufficient documentation

## 2012-09-21 DIAGNOSIS — M25519 Pain in unspecified shoulder: Secondary | ICD-10-CM | POA: Insufficient documentation

## 2012-09-26 ENCOUNTER — Ambulatory Visit: Payer: 59 | Admitting: Physical Therapy

## 2012-09-28 ENCOUNTER — Encounter: Payer: Self-pay | Admitting: Physical Therapy

## 2012-09-28 ENCOUNTER — Ambulatory Visit: Payer: 59 | Admitting: Physical Therapy

## 2012-10-10 ENCOUNTER — Encounter: Payer: Self-pay | Admitting: Physical Therapy

## 2012-10-10 ENCOUNTER — Ambulatory Visit: Payer: 59 | Admitting: Physical Therapy

## 2012-10-12 ENCOUNTER — Ambulatory Visit: Payer: 59 | Admitting: Physical Therapy

## 2012-10-17 ENCOUNTER — Ambulatory Visit: Payer: 59 | Admitting: Physical Therapy

## 2012-10-19 ENCOUNTER — Ambulatory Visit: Payer: 59 | Admitting: Physical Therapy

## 2012-10-24 ENCOUNTER — Encounter: Payer: Self-pay | Admitting: Physical Therapy

## 2013-03-02 ENCOUNTER — Other Ambulatory Visit: Payer: Self-pay

## 2013-03-02 DIAGNOSIS — Z1231 Encounter for screening mammogram for malignant neoplasm of breast: Secondary | ICD-10-CM

## 2013-03-06 ENCOUNTER — Ambulatory Visit
Admission: RE | Admit: 2013-03-06 | Discharge: 2013-03-06 | Disposition: A | Discharge status: Home / Self Care | Payer: 59 | Source: Ambulatory Visit

## 2013-03-06 DIAGNOSIS — Z1231 Encounter for screening mammogram for malignant neoplasm of breast: Secondary | ICD-10-CM

## 2013-03-09 ENCOUNTER — Encounter: Payer: Self-pay | Admitting: Gynecology

## 2013-03-09 ENCOUNTER — Ambulatory Visit (INDEPENDENT_AMBULATORY_CARE_PROVIDER_SITE_OTHER): Payer: 59 | Admitting: Gynecology

## 2013-03-09 VITALS — BP 120/78 | Ht 64.0 in | Wt 171.0 lb

## 2013-03-09 DIAGNOSIS — Z01419 Encounter for gynecological examination (general) (routine) without abnormal findings: Secondary | ICD-10-CM

## 2013-03-09 DIAGNOSIS — N898 Other specified noninflammatory disorders of vagina: Secondary | ICD-10-CM

## 2013-03-09 DIAGNOSIS — R5383 Other fatigue: Secondary | ICD-10-CM

## 2013-03-09 DIAGNOSIS — R5381 Other malaise: Secondary | ICD-10-CM

## 2013-03-09 DIAGNOSIS — Z79890 Hormone replacement therapy (postmenopausal): Secondary | ICD-10-CM

## 2013-03-09 DIAGNOSIS — L293 Anogenital pruritus, unspecified: Secondary | ICD-10-CM

## 2013-03-09 LAB — WET PREP FOR TRICH, YEAST, CLUE
Clue Cells Wet Prep HPF POC: NONE SEEN
Trich, Wet Prep: NONE SEEN
WBC, Wet Prep HPF POC: NONE SEEN

## 2013-03-09 LAB — COMPREHENSIVE METABOLIC PANEL
ALT: 17 U/L (ref 0–35)
Albumin: 4.4 g/dL (ref 3.5–5.2)
Alkaline Phosphatase: 35 U/L — ABNORMAL LOW (ref 39–117)
CO2: 26 mEq/L (ref 19–32)
Calcium: 9.9 mg/dL (ref 8.4–10.5)
Sodium: 136 mEq/L (ref 135–145)
Total Bilirubin: 0.4 mg/dL (ref 0.3–1.2)
Total Protein: 6.7 g/dL (ref 6.0–8.3)

## 2013-03-09 LAB — CBC WITH DIFFERENTIAL/PLATELET
Basophils Absolute: 0 10*3/uL (ref 0.0–0.1)
Basophils Relative: 1 % (ref 0–1)
Eosinophils Absolute: 0.2 10*3/uL (ref 0.0–0.7)
MCH: 31.1 pg (ref 26.0–34.0)
MCHC: 33.9 g/dL (ref 30.0–36.0)
Neutro Abs: 3 10*3/uL (ref 1.7–7.7)
Neutrophils Relative %: 47 % (ref 43–77)
Platelets: 274 10*3/uL (ref 150–400)
RDW: 13.9 % (ref 11.5–15.5)

## 2013-03-09 MED ORDER — ESTRADIOL 0.1 MG/24HR TD PTTW: 1.0000 | MEDICATED_PATCH | TRANSDERMAL | Status: DC

## 2013-03-09 MED ORDER — FLUCONAZOLE 150 MG PO TABS: 150.0000 mg | ORAL_TABLET | Freq: Once | ORAL | Status: DC

## 2013-03-09 NOTE — Patient Instructions (Signed)
Followup in one year for annual exam. Followup sooner if you change your mind about having the breast cancer gene blood test.

## 2013-03-09 NOTE — Progress Notes (Addendum)
Nicole Oliver 1965/12/13 [id number]        47 y.o.  G0P0 for annual exam.  Former patient of Dr. Eda Paschal. Several issues noted below.  Past medical history,surgical history, medications, allergies, family history and social history were all reviewed and documented in the EPIC chart.  ROS:  Performed and pertinent positives and negatives are included in the history, assessment and plan .  Exam: Kim assistant Filed Vitals:   03/09/13 0852  BP: 120/78  Height: 5\' 4"  (1.626 m)  Weight: 171 lb (77.565 kg)   General appearance  Normal Skin grossly normal Head/Neck normal with no cervical or supraclavicular adenopathy thyroid normal Lungs  clear Cardiac RR, without RMG Abdominal  soft, nontender, without masses, organomegaly or hernia Breasts  examined lying and sitting without masses, retractions, discharge or axillary adenopathy. Pelvic  Ext/BUS/vagina  normal with slight white discharge  Adnexa  Without masses or tenderness    Anus and perineum  normal   Rectovaginal  normal sphincter tone without palpated masses or tenderness.    Assessment/Plan:  47 y.o. G0P0 female for annual exam.   1. Status post TAH/BSO 2005 for leiomyoma and endometriosis. On ERT of Vivelle 0.075. Still having some hot flashes. Has tried stopping with unacceptable hot flashes night sweats and mood instability.  I reviewed the whole issue of HRT with her to include the WHI study with increased risk of stroke, heart attack, DVT and breast cancer. The ACOG and NAMS statements for lowest dose for the shortest period of time reviewed. Transdermal versus oral first-pass effect benefit discussed.  Benefits in a younger patient as far as bone health and cardiovascular protection also reviewed. Patient wants to continue. I did suggest trying the 0.1 mg dose to see how she does as far as her hot flashes and she agrees and I refilled her times a year. 2. Strong family history of breast cancer. Both mother and sister  developed breast cancer in their 30s and both passed away. Also has a maternal aunt in her 30s with breast cancer. Has been offered BRCA testing multiple times by Dr. Eda Paschal. I again reviewed the benefits of testing. Availability of prophylactic mastectomy for more intensive surveillance to include annual MRIs discussed. Patient clearly understands the issues and she declines testing and said she would do nothing different and she was gene positive. I've encouraged her to consider and to call me if she changes her mind. Continue with annual mammography with last mammogram this month. SBE monthly reviewed. 3. Vaginal itching. Patient has history of recurrent yeast infections. Uses Diflucan intermittently as needed. Wet prep today is negative. Discussed possible boric acid suppression. Patient is interested the would prefer continuing to use Diflucan as needed. Usually uses once a month or every other month. Diflucan 150 mg #5 refill times one provided to use as needed 4. Fatigue. Patient is having some tiredness issues. We'll check baseline CBC, has a metabolic panel and TSH. Followup with primary physician if continues. 5. Pap smear 2012. No Pap smear done today. No history of significant abnormal Pap smears. Status post hysterectomy for benign indications. Options to stop screening altogether or less frequent screening intervals reviewed. We'll readdress on an annual basis. 6. Health maintenance. Patient will continue to see her primary in followup of her hypercholesterolemia. Will followup for her CBC comprehensive metabolic panel TSH and urinalysis. Followup in one year, sooner as needed.  Note: This document was prepared with digital dictation and possible smart phrase technology. Any transcriptional errors  that result from this process are unintentional.   Dara Lords MD, 9:34 AM 03/09/2013

## 2013-03-10 LAB — URINALYSIS W MICROSCOPIC + REFLEX CULTURE
Bilirubin Urine: NEGATIVE
Crystals: NONE SEEN
Leukocytes, UA: NEGATIVE
Nitrite: NEGATIVE
Protein, ur: NEGATIVE mg/dL
Specific Gravity, Urine: 1.022 (ref 1.005–1.030)
Urobilinogen, UA: 0.2 mg/dL (ref 0.0–1.0)

## 2013-03-14 ENCOUNTER — Encounter: Payer: Self-pay | Admitting: Obstetrics and Gynecology

## 2013-04-27 ENCOUNTER — Other Ambulatory Visit: Payer: Self-pay

## 2013-07-24 ENCOUNTER — Telehealth: Payer: Self-pay

## 2013-07-24 ENCOUNTER — Other Ambulatory Visit: Payer: Self-pay | Admitting: Gynecology

## 2013-07-24 MED ORDER — ESTRADIOL 0.075 MG/24HR TD PTTW: 1.0000 | MEDICATED_PATCH | TRANSDERMAL | Status: DC

## 2013-07-24 NOTE — Telephone Encounter (Signed)
Okay to decrease through annual exam this fall

## 2013-07-24 NOTE — Telephone Encounter (Signed)
Rx sent 

## 2013-07-24 NOTE — Telephone Encounter (Signed)
Patient currently using Vivelle 0.1mg  patch. Time for a refill and she wants to decrease to the 0.075mg  dose. Ok?

## 2014-03-08 ENCOUNTER — Other Ambulatory Visit: Payer: Self-pay

## 2014-03-08 DIAGNOSIS — Z1231 Encounter for screening mammogram for malignant neoplasm of breast: Secondary | ICD-10-CM

## 2014-03-12 ENCOUNTER — Encounter: Payer: 59 | Admitting: Gynecology

## 2014-03-19 ENCOUNTER — Ambulatory Visit: Payer: Self-pay

## 2014-03-21 ENCOUNTER — Ambulatory Visit
Admission: RE | Admit: 2014-03-21 | Discharge: 2014-03-21 | Disposition: A | Discharge status: Home / Self Care | Payer: 59 | Source: Ambulatory Visit

## 2014-03-21 DIAGNOSIS — Z1231 Encounter for screening mammogram for malignant neoplasm of breast: Secondary | ICD-10-CM

## 2014-05-01 ENCOUNTER — Ambulatory Visit (INDEPENDENT_AMBULATORY_CARE_PROVIDER_SITE_OTHER): Payer: 59 | Admitting: Gynecology

## 2014-05-01 ENCOUNTER — Encounter: Payer: Self-pay | Admitting: Gynecology

## 2014-05-01 ENCOUNTER — Other Ambulatory Visit (HOSPITAL_COMMUNITY)
Admission: RE | Admit: 2014-05-01 | Discharge: 2014-05-01 | Disposition: A | Discharge status: Home / Self Care | Payer: 59 | Source: Ambulatory Visit | Attending: Gynecology | Admitting: Gynecology

## 2014-05-01 VITALS — BP 120/76 | Ht 64.0 in | Wt 170.0 lb

## 2014-05-01 DIAGNOSIS — Z01419 Encounter for gynecological examination (general) (routine) without abnormal findings: Secondary | ICD-10-CM | POA: Insufficient documentation

## 2014-05-01 DIAGNOSIS — Z79890 Hormone replacement therapy (postmenopausal): Secondary | ICD-10-CM

## 2014-05-01 LAB — COMPREHENSIVE METABOLIC PANEL
ALT: 16 U/L (ref 0–35)
AST: 17 U/L (ref 0–37)
Albumin: 5 g/dL (ref 3.5–5.2)
Alkaline Phosphatase: 30 U/L — ABNORMAL LOW (ref 39–117)
BUN: 15 mg/dL (ref 6–23)
CALCIUM: 10.3 mg/dL (ref 8.4–10.5)
CO2: 22 mEq/L (ref 19–32)
Chloride: 103 mEq/L (ref 96–112)
Creat: 0.63 mg/dL (ref 0.50–1.10)
Glucose, Bld: 95 mg/dL (ref 70–99)
Potassium: 4.6 mEq/L (ref 3.5–5.3)
Sodium: 139 mEq/L (ref 135–145)
TOTAL PROTEIN: 7.2 g/dL (ref 6.0–8.3)
Total Bilirubin: 0.5 mg/dL (ref 0.2–1.2)

## 2014-05-01 LAB — CBC WITH DIFFERENTIAL/PLATELET
BASOS ABS: 0.1 10*3/uL (ref 0.0–0.1)
Basophils Relative: 1 % (ref 0–1)
Eosinophils Absolute: 0.1 10*3/uL (ref 0.0–0.7)
Eosinophils Relative: 2 % (ref 0–5)
HEMATOCRIT: 39.8 % (ref 36.0–46.0)
Hemoglobin: 13.5 g/dL (ref 12.0–15.0)
LYMPHS ABS: 2.6 10*3/uL (ref 0.7–4.0)
LYMPHS PCT: 37 % (ref 12–46)
MCH: 31.8 pg (ref 26.0–34.0)
MCHC: 33.9 g/dL (ref 30.0–36.0)
MCV: 93.6 fL (ref 78.0–100.0)
MONO ABS: 0.6 10*3/uL (ref 0.1–1.0)
Monocytes Relative: 9 % (ref 3–12)
NEUTROS ABS: 3.5 10*3/uL (ref 1.7–7.7)
Neutrophils Relative %: 51 % (ref 43–77)
Platelets: 320 10*3/uL (ref 150–400)
RBC: 4.25 MIL/uL (ref 3.87–5.11)
RDW: 13.7 % (ref 11.5–15.5)
WBC: 6.9 10*3/uL (ref 4.0–10.5)

## 2014-05-01 LAB — TSH: TSH: 2.401 u[IU]/mL (ref 0.350–4.500)

## 2014-05-01 MED ORDER — FLUCONAZOLE 150 MG PO TABS: 150.0000 mg | ORAL_TABLET | Freq: Once | ORAL | Status: DC

## 2014-05-01 MED ORDER — ESTRADIOL 0.075 MG/24HR TD PTTW: 1.0000 | MEDICATED_PATCH | TRANSDERMAL | Status: DC

## 2014-05-01 NOTE — Addendum Note (Signed)
Addended by: Nelva Nay on: 05/01/2014 12:51 PM   Modules accepted: Orders, SmartSet

## 2014-05-01 NOTE — Patient Instructions (Signed)
Office will call you to arrange the MRI of the breast. If you do not hear from my office within one week call the office.  You may obtain a copy of any labs that were done today by logging onto MyChart as outlined in the instructions provided with your AVS (after visit summary). The office will not call with normal lab results but certainly if there are any significant abnormalities then we will contact you.   Health Maintenance, Female A healthy lifestyle and preventative care can promote health and wellness.  Maintain regular health, dental, and eye exams.  Eat a healthy diet. Foods like vegetables, fruits, whole grains, low-fat dairy products, and lean protein foods contain the nutrients you need without too many calories. Decrease your intake of foods high in solid fats, added sugars, and salt. Get information about a proper diet from your caregiver, if necessary.  Regular physical exercise is one of the most important things you can do for your health. Most adults should get at least 150 minutes of moderate-intensity exercise (any activity that increases your heart rate and causes you to sweat) each week. In addition, most adults need muscle-strengthening exercises on 2 or more days a week.   Maintain a healthy weight. The body mass index (BMI) is a screening tool to identify possible weight problems. It provides an estimate of body fat based on height and weight. Your caregiver can help determine your BMI, and can help you achieve or maintain a healthy weight. For adults 20 years and older:  A BMI below 18.5 is considered underweight.  A BMI of 18.5 to 24.9 is normal.  A BMI of 25 to 29.9 is considered overweight.  A BMI of 30 and above is considered obese.  Maintain normal blood lipids and cholesterol by exercising and minimizing your intake of saturated fat. Eat a balanced diet with plenty of fruits and vegetables. Blood tests for lipids and cholesterol should begin at age 35 and be  repeated every 5 years. If your lipid or cholesterol levels are high, you are over 50, or you are a high risk for heart disease, you may need your cholesterol levels checked more frequently.Ongoing high lipid and cholesterol levels should be treated with medicines if diet and exercise are not effective.  If you smoke, find out from your caregiver how to quit. If you do not use tobacco, do not start.  Lung cancer screening is recommended for adults aged 65 80 years who are at high risk for developing lung cancer because of a history of smoking. Yearly low-dose computed tomography (CT) is recommended for people who have at least a 30-pack-year history of smoking and are a current smoker or have quit within the past 15 years. A pack year of smoking is smoking an average of 1 pack of cigarettes a day for 1 year (for example: 1 pack a day for 30 years or 2 packs a day for 15 years). Yearly screening should continue until the smoker has stopped smoking for at least 15 years. Yearly screening should also be stopped for people who develop a health problem that would prevent them from having lung cancer treatment.  If you are pregnant, do not drink alcohol. If you are breastfeeding, be very cautious about drinking alcohol. If you are not pregnant and choose to drink alcohol, do not exceed 1 drink per day. One drink is considered to be 12 ounces (355 mL) of beer, 5 ounces (148 mL) of wine, or 1.5 ounces (44  mL) of liquor.  Avoid use of street drugs. Do not share needles with anyone. Ask for help if you need support or instructions about stopping the use of drugs.  High blood pressure causes heart disease and increases the risk of stroke. Blood pressure should be checked at least every 1 to 2 years. Ongoing high blood pressure should be treated with medicines, if weight loss and exercise are not effective.  If you are 12 to 50 years old, ask your caregiver if you should take aspirin to prevent strokes.  Diabetes  screening involves taking a blood sample to check your fasting blood sugar level. This should be done once every 3 years, after age 37, if you are within normal weight and without risk factors for diabetes. Testing should be considered at a younger age or be carried out more frequently if you are overweight and have at least 1 risk factor for diabetes.  Breast cancer screening is essential preventative care for women. You should practice "breast self-awareness." This means understanding the normal appearance and feel of your breasts and may include breast self-examination. Any changes detected, no matter how small, should be reported to a caregiver. Women in their 32s and 30s should have a clinical breast exam (CBE) by a caregiver as part of a regular health exam every 1 to 3 years. After age 109, women should have a CBE every year. Starting at age 8, women should consider having a mammogram (breast X-ray) every year. Women who have a family history of breast cancer should talk to their caregiver about genetic screening. Women at a high risk of breast cancer should talk to their caregiver about having an MRI and a mammogram every year.  Breast cancer gene (BRCA)-related cancer risk assessment is recommended for women who have family members with BRCA-related cancers. BRCA-related cancers include breast, ovarian, tubal, and peritoneal cancers. Having family members with these cancers may be associated with an increased risk for harmful changes (mutations) in the breast cancer genes BRCA1 and BRCA2. Results of the assessment will determine the need for genetic counseling and BRCA1 and BRCA2 testing.  The Pap test is a screening test for cervical cancer. Women should have a Pap test starting at age 71. Between ages 45 and 5, Pap tests should be repeated every 2 years. Beginning at age 40, you should have a Pap test every 3 years as long as the past 3 Pap tests have been normal. If you had a hysterectomy for a  problem that was not cancer or a condition that could lead to cancer, then you no longer need Pap tests. If you are between ages 72 and 64, and you have had normal Pap tests going back 10 years, you no longer need Pap tests. If you have had past treatment for cervical cancer or a condition that could lead to cancer, you need Pap tests and screening for cancer for at least 20 years after your treatment. If Pap tests have been discontinued, risk factors (such as a new sexual partner) need to be reassessed to determine if screening should be resumed. Some women have medical problems that increase the chance of getting cervical cancer. In these cases, your caregiver may recommend more frequent screening and Pap tests.  The human papillomavirus (HPV) test is an additional test that may be used for cervical cancer screening. The HPV test looks for the virus that can cause the cell changes on the cervix. The cells collected during the Pap test can be  tested for HPV. The HPV test could be used to screen women aged 87 years and older, and should be used in women of any age who have unclear Pap test results. After the age of 42, women should have HPV testing at the same frequency as a Pap test.  Colorectal cancer can be detected and often prevented. Most routine colorectal cancer screening begins at the age of 71 and continues through age 15. However, your caregiver may recommend screening at an earlier age if you have risk factors for colon cancer. On a yearly basis, your caregiver may provide home test kits to check for hidden blood in the stool. Use of a small camera at the end of a tube, to directly examine the colon (sigmoidoscopy or colonoscopy), can detect the earliest forms of colorectal cancer. Talk to your caregiver about this at age 41, when routine screening begins. Direct examination of the colon should be repeated every 5 to 10 years through age 44, unless early forms of pre-cancerous polyps or small growths  are found.  Hepatitis C blood testing is recommended for all people born from [birth year] through 1967 and any individual with known risks for hepatitis C.  Practice safe sex. Use condoms and avoid high-risk sexual practices to reduce the spread of sexually transmitted infections (STIs). Sexually active women aged 64 and younger should be checked for Chlamydia, which is a common sexually transmitted infection. Older women with new or multiple partners should also be tested for Chlamydia. Testing for other STIs is recommended if you are sexually active and at increased risk.  Osteoporosis is a disease in which the bones lose minerals and strength with aging. This can result in serious bone fractures. The risk of osteoporosis can be identified using a bone density scan. Women ages 52 and over and women at risk for fractures or osteoporosis should discuss screening with their caregivers. Ask your caregiver whether you should be taking a calcium supplement or vitamin D to reduce the rate of osteoporosis.  Menopause can be associated with physical symptoms and risks. Hormone replacement therapy is available to decrease symptoms and risks. You should talk to your caregiver about whether hormone replacement therapy is right for you.  Use sunscreen. Apply sunscreen liberally and repeatedly throughout the day. You should seek shade when your shadow is shorter than you. Protect yourself by wearing long sleeves, pants, a wide-brimmed hat, and sunglasses year round, whenever you are outdoors.  Notify your caregiver of new moles or changes in moles, especially if there is a change in shape or color. Also notify your caregiver if a mole is larger than the size of a pencil eraser.  Stay current with your immunizations. Document Released: 12/22/2010 Document Revised: 10/03/2012 Document Reviewed: 12/22/2010 [facility] Patient Information 2014 [place].

## 2014-05-01 NOTE — Progress Notes (Signed)
DANYELL SHADER Dec 06, 1965 [id number]        48 y.o.  G0P0 for annual exam.  Several issues noted below.  Past medical history,surgical history, problem list, medications, allergies, family history and social history were all reviewed and documented as reviewed in the EPIC chart.  ROS:  12 system ROS performed with pertinent positives and negatives included in the history, assessment and plan.   Additional significant findings :  none   Exam: Kim Counsellor Vitals:   05/01/14 1152  BP: 120/76  Height: 5\' 4"  (1.626 m)  Weight: 170 lb (77.111 kg)   General appearance:  Normal affect, orientation and appearance. Skin: Grossly normal HEENT: Without gross lesions.  No cervical or supraclavicular adenopathy. Thyroid normal.  Lungs:  Clear without wheezing, rales or rhonchi Cardiac: RR, without RMG Abdominal:  Soft, nontender, without masses, guarding, rebound, organomegaly or hernia Breasts:  Examined lying and sitting without masses, retractions, discharge or axillary adenopathy. Pelvic:  Ext/BUS/vagina normal. Pap of cuff done  Adnexa  Without masses or tenderness    Anus and perineum  Normal   Rectovaginal  Normal sphincter tone without palpated masses or tenderness.    Assessment/Plan:  48 y.o. G0P0 female for annual exam.   1. Status post TAH/BSO 2005 for leiomyoma and endometriosis. Currently on Vivelle 0.075 patch. Doing well and wants to continue.  I again reviewed the whole issue of HRT with her to include the WHI study with increased risk of stroke, heart attack, DVT and breast cancer. The ACOG and NAMS statements for lowest dose for the shortest period of time reviewed. Transdermal versus oral first-pass effect benefit discussed.  Patient strongly wants to continue as she notes that if she delays putting a new patch on she has significant hot flushes. She clearly understands the issues and risks.  I refilled her 1 year. 2. Family history of breast cancer. Very strong  family history with mother and sister and maternal aunt having premenopausal breast cancer and passing away from this. Has been offered on multiple occasions genetic counseling by both Dr. Cherylann Banas and myself. I again recommended and urged her to consider genetic counseling and she declines. I also reviewed benefit of MRI screening given her strong family history and she does agree to doing it this year and will go ahead and arrange for an MRI of the breast. If she changes her mind as far as genetic counseling she knows to call me and we'll help arrange this.  Mammography 02/2014 negative. SBE monthly reviewed. 3. Pap smear 2012. Pap of vaginal cuff done today. No history of significant abnormal Pap smears. Discussed stop screening as she is status post hysterectomy for benign indications versus less frequent screening intervals. Will readdress on an annual basis. 4. DEXA 2011 normal. Will repeat further into the menopause. Increase calcium vitamin D reviewed.  Check vitamin D level today. 5. History of recurrent yeast infections. Treats herself with Diflucan once monthly which works from a suppressive standpoint. Has done this for years per Dr. Cherylann Banas.  Diflucan 150 mg #5 with 1 refill provided. 6. Health maintenance. Primary physician follows her cholesterol. Will check baseline CBC comprehensive metabolic panel TSH vitamin D urinalysis. Follow up for MRI. Call if she changes her mind about genetic counseling. Follow up in one year for annual exam.     [name] MD, 12:19 PM 05/01/2014

## 2014-05-02 ENCOUNTER — Telehealth: Payer: Self-pay | Admitting: *Deleted

## 2014-05-02 DIAGNOSIS — Z803 Family history of malignant neoplasm of breast: Secondary | ICD-10-CM

## 2014-05-02 LAB — URINALYSIS W MICROSCOPIC + REFLEX CULTURE
BILIRUBIN URINE: NEGATIVE
Bacteria, UA: NONE SEEN
Casts: NONE SEEN
Crystals: NONE SEEN
GLUCOSE, UA: NEGATIVE mg/dL
HGB URINE DIPSTICK: NEGATIVE
KETONES UR: NEGATIVE mg/dL
Leukocytes, UA: NEGATIVE
Nitrite: NEGATIVE
PROTEIN: NEGATIVE mg/dL
Specific Gravity, Urine: 1.018 (ref 1.005–1.030)
Squamous Epithelial / LPF: NONE SEEN
Urobilinogen, UA: 0.2 mg/dL (ref 0.0–1.0)
pH: 5 (ref 5.0–8.0)

## 2014-05-02 LAB — CYTOLOGY - PAP

## 2014-05-02 LAB — VITAMIN D 25 HYDROXY (VIT D DEFICIENCY, FRACTURES): Vit D, 25-Hydroxy: 34 ng/mL (ref 30–89)

## 2014-05-02 NOTE — Telephone Encounter (Signed)
-----   Message from [name], MD sent at 05/01/2014 12:25 PM EST ----- Schedule bilateral breast MRI reference strong family history of breast cancer in multiple relatives. Declines genetic counseling.

## 2014-05-02 NOTE — Telephone Encounter (Signed)
Pt informed with the below note,  

## 2014-05-02 NOTE — Telephone Encounter (Signed)
Left message for pt to call, MRI on 05/08/14 @ 3:00 pm at [facility].

## 2014-05-08 ENCOUNTER — Ambulatory Visit (HOSPITAL_COMMUNITY)
Admission: RE | Admit: 2014-05-08 | Discharge: 2014-05-08 | Disposition: A | Discharge status: Home / Self Care | Payer: 59 | Source: Ambulatory Visit | Attending: Gynecology | Admitting: Gynecology

## 2014-05-08 DIAGNOSIS — Z803 Family history of malignant neoplasm of breast: Secondary | ICD-10-CM | POA: Diagnosis present

## 2014-05-08 MED ORDER — GADOBENATE DIMEGLUMINE 529 MG/ML IV SOLN
15.0000 mL | Freq: Once | INTRAVENOUS | Status: AC | PRN
  Administered 2014-05-08: 15 mL via INTRAVENOUS

## 2014-05-21 ENCOUNTER — Other Ambulatory Visit: Payer: Self-pay | Admitting: Obstetrics and Gynecology

## 2014-09-16 ENCOUNTER — Emergency Department (HOSPITAL_COMMUNITY)
Admission: EM | Admit: 2014-09-16 | Discharge: 2014-09-16 | Disposition: A | Discharge status: Home / Self Care | Payer: 59 | Attending: Emergency Medicine | Admitting: Emergency Medicine

## 2014-09-16 ENCOUNTER — Encounter (HOSPITAL_COMMUNITY): Payer: Self-pay | Admitting: Emergency Medicine

## 2014-09-16 ENCOUNTER — Emergency Department (HOSPITAL_COMMUNITY): Payer: 59

## 2014-09-16 DIAGNOSIS — W1839XA Other fall on same level, initial encounter: Secondary | ICD-10-CM | POA: Diagnosis not present

## 2014-09-16 DIAGNOSIS — Y9289 Other specified places as the place of occurrence of the external cause: Secondary | ICD-10-CM | POA: Insufficient documentation

## 2014-09-16 DIAGNOSIS — Z87891 Personal history of nicotine dependence: Secondary | ICD-10-CM | POA: Insufficient documentation

## 2014-09-16 DIAGNOSIS — Y9389 Activity, other specified: Secondary | ICD-10-CM | POA: Insufficient documentation

## 2014-09-16 DIAGNOSIS — M25462 Effusion, left knee: Secondary | ICD-10-CM

## 2014-09-16 DIAGNOSIS — Y998 Other external cause status: Secondary | ICD-10-CM | POA: Insufficient documentation

## 2014-09-16 DIAGNOSIS — S8992XA Unspecified injury of left lower leg, initial encounter: Secondary | ICD-10-CM | POA: Insufficient documentation

## 2014-09-16 DIAGNOSIS — F419 Anxiety disorder, unspecified: Secondary | ICD-10-CM | POA: Diagnosis not present

## 2014-09-16 DIAGNOSIS — Z8639 Personal history of other endocrine, nutritional and metabolic disease: Secondary | ICD-10-CM | POA: Diagnosis not present

## 2014-09-16 MED ORDER — HYDROCODONE-ACETAMINOPHEN 5-325 MG PO TABS: 1.0000 | ORAL_TABLET | ORAL | Status: DC | PRN

## 2014-09-16 MED ORDER — MELOXICAM 15 MG PO TABS: 15.0000 mg | ORAL_TABLET | Freq: Every day | ORAL | Status: DC

## 2014-09-16 MED ORDER — HYDROCODONE-ACETAMINOPHEN 5-325 MG PO TABS
1.0000 | ORAL_TABLET | Freq: Once | ORAL | Status: AC
  Administered 2014-09-16: 1 via ORAL
  Filled 2014-09-16: qty 1

## 2014-09-16 MED ORDER — ONDANSETRON HCL 4 MG PO TABS
4.0000 mg | ORAL_TABLET | Freq: Once | ORAL | Status: AC
  Administered 2014-09-16: 4 mg via ORAL
  Filled 2014-09-16: qty 1

## 2014-09-16 MED ORDER — KETOROLAC TROMETHAMINE 10 MG PO TABS
10.0000 mg | ORAL_TABLET | Freq: Once | ORAL | Status: AC
  Administered 2014-09-16: 10 mg via ORAL
  Filled 2014-09-16: qty 1

## 2014-09-16 NOTE — ED Provider Notes (Signed)
CSN: [id number]     Arrival date & time 09/16/14  1129 History  This chart was scribed for Nicole Kocher, PA-C, working with Fredia Sorrow, MD by Girtha Hake, ED Scribe. The patient was seen in APFT22/APFT22. The patient's care was started at 1:29 PM.     Chief Complaint  Patient presents with  . Knee Pain   Patient is a 49 y.o. female presenting with knee pain. The history is provided by the patient. No language interpreter was used.  Knee Pain Location:  Knee Time since incident:  3 hours Injury: yes   Mechanism of injury: fall   Fall:    Fall occurred:  From a stool   Entrapped after fall: no   Knee location:  L knee Pain details:    Severity:  Moderate   Onset quality:  Sudden   Duration:  3 hours   Timing:  Constant   Progression:  Unchanged Chronicity:  New Dislocation: no   Prior injury to area:  No Associated symptoms: no back pain and no neck pain    HPI Comments: Nicole Oliver is a 49 y.o. female who presents to the Emergency Department complaining of left knee pain beginning three hours ago. Patient reports that she fell from a stool and twisted her knee. Patient reports that the heard a "snap" in the knee when she fell. Patient states that she cannot bear weight on the knee. Patient denies taking any medications for the pani. She reports that she has a history of tendonitis in the knee. She denies a history of surgery to the knee. She does not take any blood thinning medications.  PCP is Dr. Marisue Humble.   Past Medical History  Diagnosis Date  . Anxiety   . Elevated cholesterol    Past Surgical History  Procedure Laterality Date  . Eye surgery    . Nasal septum surgery    . Oophorectomy      BSO  . Abdominal hysterectomy  2005    TAH BSO endometriosis/leiomyoma   Family History  Problem Relation Age of Onset  . Breast cancer Mother 59  . Ovarian cancer Mother   . Cancer Mother     Pancreatic  . Hypertension Father   . Hyperlipidemia Father   .  Breast cancer Sister 75  . Breast cancer Maternal Aunt 38  . Hypertension Maternal Grandmother   . Breast cancer Other 26   History  Substance Use Topics  . Smoking status: Former Research scientist (life sciences)  . Smokeless tobacco: Never Used  . Alcohol Use: No   OB History    Gravida Para Term Preterm AB TAB SAB Ectopic Multiple Living   0              Review of Systems  Constitutional: Negative for activity change.       All ROS Neg except as noted in HPI  HENT: Negative for nosebleeds.   Eyes: Negative for photophobia and discharge.  Respiratory: Negative for cough, shortness of breath and wheezing.   Cardiovascular: Negative for chest pain and palpitations.  Gastrointestinal: Negative for abdominal pain and blood in stool.  Genitourinary: Negative for dysuria, frequency and hematuria.  Musculoskeletal: Positive for arthralgias (Left knee). Negative for back pain and neck pain.  Skin: Negative.   Neurological: Negative for dizziness, seizures and speech difficulty.  Psychiatric/Behavioral: Negative for hallucinations and confusion. The patient is nervous/anxious.   All other systems reviewed and are negative.     Allergies  Sulfa antibiotics  Home Medications   Prior to Admission medications   Medication Sig Start Date End Date Taking? Authorizing Provider  ALPRAZolam (XANAX) 0.25 MG tablet Take 0.25 mg by mouth as needed.      Historical Provider, MD  Calcium Carbonate-Vitamin D (CALCIUM + D PO) Take by mouth.      Historical Provider, MD  estradiol (VIVELLE-DOT) 0.075 MG/24HR Place 1 patch onto the skin 2 (two) times a week. 05/01/14   [name], MD  FENOFIBRATE MICRONIZED PO Take by mouth.    Historical Provider, MD  fluconazole (DIFLUCAN) 150 MG tablet Take 1 tablet (150 mg total) by mouth once. As needed for yeast 05/01/14   [name], MD  Multiple Vitamin (MULTIVITAMIN) capsule Take 1 capsule by mouth daily.      Historical Provider, MD  nitrofurantoin (MACRODANTIN)  50 MG capsule TAKE 1 CAPSULE BY MOUTH AS DIRECTED AS NEEDED 05/21/14   [name], MD   Triage Vitals: BP 135/66 mmHg  Pulse 75  Temp(Src) 98.8 F (37.1 C) (Oral)  Resp 16  Ht 5\' 5"  (1.651 m)  Wt 170 lb (77.111 kg)  BMI 28.29 kg/m2  SpO2 97%  LMP 06/23/2003 Physical Exam  Constitutional: She is oriented to person, place, and time. She appears well-developed and well-nourished. No distress.  HENT:  Head: Normocephalic and atraumatic.  Eyes: Conjunctivae and EOM are normal.  Neck: Neck supple. No tracheal deviation present.  Cardiovascular: Normal rate, regular rhythm and normal heart sounds.   Pulmonary/Chest: Effort normal and breath sounds normal. No respiratory distress. She has no wheezes. She has no rales.  Musculoskeletal: She exhibits tenderness.  Full ROM of the left ankle. The left achilles tendon is intact. No palpable deformity of the tibial area. Moderate effusion of the left knee. No posterior mass on the left knee. Patella midline. No deformity of the anterior tibial tuberosity. No palpable deformity of the quadricept area. Moderate laxity of the joint. No pain to palpation of the left hip or thigh.  Neurological: She is alert and oriented to person, place, and time.  Skin: Skin is warm and dry.  Psychiatric: She has a normal mood and affect. Her behavior is normal.  Nursing note and vitals reviewed.   ED Course  Procedures (including critical care time) DIAGNOSTIC STUDIES: Oxygen Saturation is 97% on room air, normal by my interpretation.    COORDINATION OF CARE: 1:34 PM - X-ray reviewed. No fracture present. Informed patient of plant for care which includes knee immobilizer and referral to orthopedics.    Labs Review Labs Reviewed - No data to display  Imaging Review No results found.   EKG Interpretation None      MDM  No fracture. No dislocation. DJD present Effusion present. No hip or ankle problem.   Final diagnoses:  None    *I  have reviewed nursing notes, vital signs, and all appropriate lab and imaging results for this patient.**  **I personally performed the services described in this documentation, which was scribed in my presence. The recorded information has been reviewed and is accurate.   Nicole Kocher, PA-C 09/17/14 [place], MD 09/25/14 [phone]

## 2014-09-16 NOTE — Discharge Instructions (Signed)
There is fluid noted in the left knee joint area. Please see Dr. Aline Brochure for additional evaluation concerning the knee. Please use the knee immobilizer and crutches until seen by the orthopedic specialist. You do not have to sleep in this device.  Knee Effusion The medical term for having fluid in your knee is effusion. This is often due to an internal derangement of the knee. This means something is wrong inside the knee. Some of the causes of fluid in the knee may be torn cartilage, a torn ligament, or bleeding into the joint from an injury. Your knee is likely more difficult to bend and move. This is often because there is increased pain and pressure in the joint. The time it takes for recovery from a knee effusion depends on different factors, including:   Type of injury.  Your age.  Physical and medical conditions.  Rehabilitation Strategies. How long you will be away from your normal activities will depend on what kind of knee problem you have and how much damage is present. Your knee has two types of cartilage. Articular cartilage covers the bone ends and lets your knee bend and move smoothly. Two menisci, thick pads of cartilage that form a rim inside the joint, help absorb shock and stabilize your knee. Ligaments bind the bones together and support your knee joint. Muscles move the joint, help support your knee, and take stress off the joint itself. CAUSES  Often an effusion in the knee is caused by an injury to one of the menisci. This is often a tear in the cartilage. Recovery after a meniscus injury depends on how much meniscus is damaged and whether you have damaged other knee tissue. Small tears may heal on their own with conservative treatment. Conservative means rest, limited weight bearing activity and muscle strengthening exercises. Your recovery may take up to 6 weeks.  TREATMENT  Larger tears may require surgery. Meniscus injuries may be treated during arthroscopy. Arthroscopy is  a procedure in which your surgeon uses a small telescope like instrument to look in your knee. Your caregiver can make a more accurate diagnosis (learning what is wrong) by performing an arthroscopic procedure. If your injury is on the inner margin of the meniscus, your surgeon may trim the meniscus back to a smooth rim. In other cases your surgeon will try to repair a damaged meniscus with stitches (sutures). This may make rehabilitation take longer, but may provide better long term result by helping your knee keep its shock absorption capabilities. Ligaments which are completely torn usually require surgery for repair. HOME CARE INSTRUCTIONS  Use crutches as instructed.  If a brace is applied, use as directed.  Once you are home, an ice pack applied to your swollen knee may help with discomfort and help decrease swelling.  Keep your knee raised (elevated) when you are not up and around or on crutches.  Only take over-the-counter or prescription medicines for pain, discomfort, or fever as directed by your caregiver.  Your caregivers will help with instructions for rehabilitation of your knee. This often includes strengthening exercises.  You may resume a normal diet and activities as directed. SEEK MEDICAL CARE IF:   There is increased swelling in your knee.  You notice redness, swelling, or increasing pain in your knee.  An unexplained oral temperature above 102 F (38.9 C) develops. SEEK IMMEDIATE MEDICAL CARE IF:   You develop a rash.  You have difficulty breathing.  You have any allergic reactions from medications you  may have been given.  There is severe pain with any motion of the knee. MAKE SURE YOU:   Understand these instructions.  Will watch your condition.  Will get help right away if you are not doing well or get worse. Document Released: 08/29/2003 Document Revised: 08/31/2011 Document Reviewed: 11/02/2007 [facility] Patient Information 2015 [place], [place].  This information is not intended to replace advice given to you by your health care provider. Make sure you discuss any questions you have with your health care provider. Please apply ice. Please use mobile daily with food. They use Norco every 4 hours if needed for pain. This medication may cause drowsiness, please do not drink alcohol, drive a vehicle, operating machinery, or participate in activities that require concentration while taking this medication.

## 2014-09-16 NOTE — ED Notes (Signed)
Patient complaining of left knee pain. States fell and knee twisted. States cannot bear weight to knee.

## 2014-09-19 ENCOUNTER — Other Ambulatory Visit: Payer: Self-pay | Admitting: Orthopedic Surgery

## 2014-09-19 DIAGNOSIS — M25562 Pain in left knee: Secondary | ICD-10-CM

## 2014-09-20 ENCOUNTER — Ambulatory Visit
Admission: RE | Admit: 2014-09-20 | Discharge: 2014-09-20 | Disposition: A | Discharge status: Home / Self Care | Payer: 59 | Source: Ambulatory Visit | Attending: Orthopedic Surgery | Admitting: Orthopedic Surgery

## 2014-09-20 DIAGNOSIS — M25562 Pain in left knee: Secondary | ICD-10-CM

## 2014-09-25 ENCOUNTER — Ambulatory Visit (HOSPITAL_COMMUNITY)
Admission: RE | Admit: 2014-09-25 | Discharge: 2014-09-25 | Disposition: A | Discharge status: Home / Self Care | Payer: 59 | Source: Ambulatory Visit | Attending: Family Medicine | Admitting: Family Medicine

## 2014-09-25 ENCOUNTER — Other Ambulatory Visit (HOSPITAL_COMMUNITY): Payer: Self-pay | Admitting: Orthopedic Surgery

## 2014-09-25 DIAGNOSIS — M79662 Pain in left lower leg: Secondary | ICD-10-CM

## 2014-09-25 NOTE — Progress Notes (Signed)
*  Preliminary Results* Left lower extremity venous duplex completed. Left lower extremity is negative for deep vein thrombosis. There is no evidence of left Baker's cyst.  09/25/2014 4:19 PM  Maudry Mayhew, RVT, RDCS, RDMS

## 2014-10-04 ENCOUNTER — Ambulatory Visit: Payer: 59 | Attending: Orthopaedic Surgery | Admitting: Physical Therapy

## 2014-10-04 ENCOUNTER — Encounter: Payer: Self-pay | Admitting: Physical Therapy

## 2014-10-04 DIAGNOSIS — S83512D Sprain of anterior cruciate ligament of left knee, subsequent encounter: Secondary | ICD-10-CM | POA: Diagnosis not present

## 2014-10-04 DIAGNOSIS — X58XXXD Exposure to other specified factors, subsequent encounter: Secondary | ICD-10-CM | POA: Insufficient documentation

## 2014-10-04 DIAGNOSIS — M25662 Stiffness of left knee, not elsewhere classified: Secondary | ICD-10-CM | POA: Diagnosis not present

## 2014-10-04 DIAGNOSIS — R262 Difficulty in walking, not elsewhere classified: Secondary | ICD-10-CM | POA: Diagnosis not present

## 2014-10-04 DIAGNOSIS — M6281 Muscle weakness (generalized): Secondary | ICD-10-CM | POA: Diagnosis not present

## 2014-10-04 DIAGNOSIS — S83512A Sprain of anterior cruciate ligament of left knee, initial encounter: Secondary | ICD-10-CM

## 2014-10-04 NOTE — Patient Instructions (Signed)
   Copyright  VHI. All rights reserved.  HIP: Flexion / KNEE: Extension, Straight Leg Raise   Raise leg, keeping knee straight. Perform slowly. ___ reps per set, ___ sets per day, ___ days per week   Copyright  VHI. All rights reserved.  Heel Slide   Bend knee and pull heel toward buttocks. Hold __5__ seconds. Return. Repeat with other knee. Repeat ___10_ times. Do __8__ sessions per day.  http://gt2.exer.us/372   Copyright  VHI. All rights reserved.     Raise leg until knee is straight. __10_ reps per set, ___ sets per day, _7__ days per week  Copyright  VHI. All rights reserved.  Short Arc [employer] a large can or rolled towel under leg. Straighten knee and leg. Hold _5___ seconds. Repeat with other leg. Repeat __10__ times. Do __8__ sessions per day.  http://gt2.exer.us/365   Copyright  VHI. All rights reserved.  Quad Set   Slowly tighten muscles on thigh of straight leg while counting out loud to _10___. Repeat with other leg. Repeat ___10_ times. Do __8__ sessions per day.  http://gt2.exer.us/361   Copyright  VHI. All rights reserved.

## 2014-10-05 NOTE — Therapy (Signed)
[place], [place], [address] Phone: [phone]   Fax:  [phone]  Physical Therapy Evaluation  Patient Details  Name: Nicole Oliver MRN: [id number] Date of Birth: 1965-07-07 Referring Provider:  Garald Balding, MD  Encounter Date: 10/04/2014      PT End of Session - 10/05/14 0653    Visit Number 1   Number of Visits 16   Date for PT Re-Evaluation 11/29/14   PT Start Time 0930   PT Stop Time 1020   PT Time Calculation (min) 50 min   Activity Tolerance Patient limited by pain      Past Medical History  Diagnosis Date  . Anxiety   . Elevated cholesterol     Past Surgical History  Procedure Laterality Date  . Eye surgery    . Nasal septum surgery    . Oophorectomy      BSO  . Abdominal hysterectomy  2005    TAH BSO endometriosis/leiomyoma    There were no vitals filed for this visit.  Visit Diagnosis:  ACL tear, left, initial encounter - Plan: PT plan of care cert/re-cert  Joint stiffness of knee, left - Plan: PT plan of care cert/re-cert  Muscle weakness of lower extremity - Plan: PT plan of care cert/re-cert  Difficulty in walking - Plan: PT plan of care cert/re-cert      Subjective Assessment - 10/04/14 0938    Subjective On Easter AM fell at home injuring left knee.  Went to ED and followed up with ortho. Presents with knee brace and crutches.   MRI showed complete tear of ACL and bone contusions of tibial plateaus.  Intact mensicus and collateral ligaments.     Pertinent History No previous knee problems   Limitations Walking;House hold activities;Standing   How long can you sit comfortably? 30 min   How long can you stand comfortably? 15 min   How long can you walk comfortably? 15 min with crutches cant go to grocery store   Diagnostic tests MRI   Patient Stated Goals have my knee work like it's supposed to 100%   Currently in Pain? Yes   Pain Score 0-No pain   Pain  Location Knee  medial knee   Pain Orientation Left   Pain Descriptors / Indicators --  feels like a nail through knee cap   Pain Type Acute pain   Pain Onset 1 to 4 weeks ago   Pain Frequency Intermittent   Aggravating Factors  guarding; prolonged walking,  rotating leg outward   Pain Relieving Factors ice;  elevating on pillows            [facility] PT Assessment - 10/04/14 0944    Assessment   Medical Diagnosis ACL tear left   Onset Date 09/16/14   Next MD Visit 10/15/14   Prior Therapy none   Balance Screen   Has the patient fallen in the past 6 months Yes   How many times? 1   Has the patient had a decrease in activity level because of a fear of falling?  Yes   Is the patient reluctant to leave their home because of a fear of falling?  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[place], Community education officer; sharp shooting   Observation/Other Assessments   Focus on Therapeutic Outcomes (FOTO)  58%   Posture/Postural Control   Posture Comments Moderate peripatellar puffiness; left distal quad atrophy   ROM / Strength   AROM / PROM / Strength AROM;PROM;Strength   AROM   AROM Assessment Site Knee   Right/Left Knee Right;Left   Right Knee Extension 0   Right Knee Flexion 140   Left Knee Extension 12  supine 5   Left Knee Flexion 58  62   PROM   PROM Assessment Site Knee   Strength   Overall Strength Comments Patient able to do left SLR with min quad lag   Strength Assessment Site Hip;Knee   Right/Left Hip Right;Left   Right Hip ABduction 5/5   Left Hip  ABduction 4+/5   Right/Left Knee Right;Left   Right Knee Flexion 5/5   Right Knee Extension 5/5   Left Knee Flexion 2/5   Left Knee Extension 2/5   Palpation   Palpation tender medial knee joint line   Special Tests    Special Tests Knee Special Tests   Knee Special tests  --  Mild discomfort with quad set   Ambulation/Gait   Ambulation/Gait --  Patient presents with bilateral crutches, wearing kneebrace   Stairs --  Verbally reviewed one at a time method with patient                           PT Education - 10/05/14 [phone]    Education provided Yes   Education Details knee level 1 basic exercises   Person(s) Educated Patient   Methods Explanation;Demonstration;Handout   Comprehension Verbalized understanding;Returned demonstration          PT Short Term Goals - 10/05/14 0654    PT SHORT TERM GOAL #1   Title Patient will have a good understanding of pain management and edema management self care techniques   Time 4   Period Weeks   Status New   PT SHORT TERM GOAL #2   Title Left knee flexion improved to 90 degrees need for greater ease getting in/out of the car.     Time 4   Period Weeks   Status New   PT SHORT TERM GOAL #3   Title Patient will be able to ambulate > 800 feet needed for grocery shopping.   Time 4   Period Weeks   Status New   PT SHORT TERM GOAL #4   Title Left knee extension improved to 6 degrees needed for greater ease with ambulation for return to work.   Time 4   Period Weeks   Status New           PT Long Term Goals - 10/05/14 6644    PT LONG TERM GOAL #1   Title Patient will be independent in a progressive HEP needed for further ROM and strengthening of left LE.   Time 8   Period Weeks   Status New   PT LONG TERM GOAL #2   Title Left knee AROM improved to 2-128 degrees needed for greater ease ascending and descending steps to enter/exit home.   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   Time 8   Period Weeks   Status New   PT LONG TERM GOAL #4   Title Patient will be able to ambulate 1000 feet needed for community ambulation.   Time 8   Period Weeks   Status New   PT LONG TERM GOAL #5   Title FOTO functional outcome score improved to 36% indicating improved function with less pain.   Time 8   Period Weeks   Status New               Plan - 10/05/14 0815    Clinical Impression Statement The patient is a 49 year old active female who reports that on Easter morning (2 1/2 weeks ago), she was standing on a stool at home and fell injuring her left knee.  She had an x-ray in the ED and then a MRI at the orthopedist's which revealed a complete left ACL tear and tibial plateau bone contusions but intact PCL and collateral ligaments.  She presents with a long knee Don-Joy brace and bilateral crutches.  Her left knee AROM is very limited:  12-58 degrees.  Strength in available ROM is 2/5 secondary to pain although she is able to do a SLR with minimal quad lag.  Mild peri-patellar edema, pain medial knee joint line.  Therapist recommended adjusting crutch arm length and instructed to avoid leaning on axillas with crtuches.  She goes up and down steps one at a time with difficulty.    She states she is unable to stand in the shower to bathe and has not attempted to go to the grocery store.  She is out of work from her desk job in the [employer] because she is so limited in her overall mobility.  She previously enjoyed outdoor activities including kayaking, Community education officer and sharp shooting.  She is concerned about the need for surgery.     Pt will benefit from skilled therapeutic intervention in order to improve on the following deficits Abnormal gait;Decreased activity tolerance;Decreased range of motion;Decreased strength;Difficulty walking;Pain   Rehab  Potential Good   PT Frequency 2x / week   PT Duration 8 weeks   PT Treatment/Interventions ADLs/Self Care Home Management;Cryotherapy;Banker;Therapeutic activities;Therapeutic exercise;Neuromuscular re-education;Patient/family education;Manual techniques   PT Next Visit Plan Review knee level 1 basic exercises:  quad set, SLR, heel slides, hip abduction and progress gradually as tolerated by pain; ?Nu-Step for ROM; cryotherapy         Problem List Patient Active Problem List   Diagnosis Date Noted  . Finger pain 04/13/2012    [name] 10/05/2014, 11:50 AM  [facility] [address] [place], [place], [address] Phone: [phone]   Fax:  [place], PT 10/05/2014 11:51 AM Phone: [phone] Fax: [phone]

## 2014-10-09 ENCOUNTER — Ambulatory Visit: Payer: 59

## 2014-10-09 DIAGNOSIS — S83512D Sprain of anterior cruciate ligament of left knee, subsequent encounter: Secondary | ICD-10-CM | POA: Diagnosis not present

## 2014-10-09 DIAGNOSIS — M25662 Stiffness of left knee, not elsewhere classified: Secondary | ICD-10-CM

## 2014-10-09 DIAGNOSIS — M6281 Muscle weakness (generalized): Secondary | ICD-10-CM

## 2014-10-09 NOTE — Patient Instructions (Signed)
Encouraged pt to continue exercise, that range has improved and she will do well in long run

## 2014-10-09 NOTE — Therapy (Signed)
[place], [place], [address] Phone: [phone]   Fax:  [phone]  Physical Therapy Treatment  Patient Details  Name: JAMI BOGDANSKI MRN: [id number] Date of Birth: Oct 19, 1965 Referring Provider:  Garald Balding, MD  Encounter Date: 10/09/2014      PT End of Session - 10/09/14 1225    Visit Number 2   Number of Visits 16   Date for PT Re-Evaluation 11/29/14   PT Start Time 5176   PT Stop Time 1240   PT Time Calculation (min) 55 min   Activity Tolerance Patient tolerated treatment well   Behavior During Therapy [facility] for tasks assessed/performed      Past Medical History  Diagnosis Date  . Anxiety   . Elevated cholesterol     Past Surgical History  Procedure Laterality Date  . Eye surgery    . Nasal septum surgery    . Oophorectomy      BSO  . Abdominal hysterectomy  2005    TAH BSO endometriosis/leiomyoma    There were no vitals filed for this visit.  Visit Diagnosis:  Joint stiffness of knee, left  Muscle weakness of lower extremity      Subjective Assessment - 10/09/14 1151    Subjective Can bear more weight.   Currently in Pain? No/denies   Multiple Pain Sites No            [facility] PT Assessment - 10/09/14 1215    AROM   Left Knee Extension 12   Left Knee Flexion 93  105 degrees assisted                     [facility] Adult PT Treatment/Exercise - 10/09/14 1156    Exercises   Exercises Knee/Hip;Ankle   Knee/Hip Exercises: Supine   Quad Sets AROM;Left;1 set;15 reps   Short [employer] AROM;Left;1 set;15 reps   Straight Leg Raises AROM;Left;1 set;15 reps   Knee/Hip Exercises: Sidelying   Hip ABduction AROM;Left;1 set;15 reps   Knee/Hip Exercises: Prone   Hamstring Curl 1 set;15 reps;2 seconds   Straight Leg Raises AROM;Left;1 set;15 reps   Modalities   Modalities Cryotherapy   Cryotherapy   Number Minutes Cryotherapy 15 Minutes   Cryotherapy  Location Knee   Type of Cryotherapy --  Game ready   Manual Therapy   Manual Therapy Joint mobilization;Manual Traction;Passive ROM   Joint Mobilization AP glides 2-3 for flexion range   Passive ROM flexion LT knee   Manual Traction with knee range   Ankle Exercises: Supine   Other Supine Ankle Exercises ankle pumps x 25                  PT Short Term Goals - 10/09/14 1227    PT SHORT TERM GOAL #2   Title Left knee flexion improved to 90 degrees need for greater ease getting in/out of the car.     Status Achieved           PT Long Term Goals - 10/05/14 1607    PT LONG TERM GOAL #1   Title Patient will be independent in a progressive HEP needed for further ROM and strengthening of left LE.   Time 8   Period Weeks   Status New   PT LONG TERM GOAL #2   Title Left knee AROM improved to 2-128 degrees needed for greater ease ascending and descending steps to enter/exit home.   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   Time 8   Period Weeks   Status New   PT LONG TERM GOAL #4   Title Patient will be able to ambulate 1000 feet needed for community ambulation.   Time 8   Period Weeks   Status New   PT LONG TERM GOAL #5   Title FOTO functional outcome score improved to 36% indicating improved function with less pain.   Time 8   Period Weeks   Status New               Plan - 10/09/14 1226    Clinical Impression Statement Range improved and she is doing her HEP . She was encouraged to stay positive as she appears to be progressing   PT Next Visit Plan Progress range and strength as tolerated   Consulted and Agree with Plan of Care Patient        Problem List Patient Active Problem List   Diagnosis Date Noted  . Finger pain 04/13/2012    Darrel Hoover PT 10/09/2014, 12:28 PM  [place] [facility] [address] [place], [place], [address] Phone: [phone]   Fax:  [phone]

## 2014-10-16 ENCOUNTER — Ambulatory Visit: Payer: 59

## 2014-10-16 DIAGNOSIS — M25662 Stiffness of left knee, not elsewhere classified: Secondary | ICD-10-CM

## 2014-10-16 DIAGNOSIS — R262 Difficulty in walking, not elsewhere classified: Secondary | ICD-10-CM

## 2014-10-16 DIAGNOSIS — S83512D Sprain of anterior cruciate ligament of left knee, subsequent encounter: Secondary | ICD-10-CM | POA: Diagnosis not present

## 2014-10-16 DIAGNOSIS — M6281 Muscle weakness (generalized): Secondary | ICD-10-CM

## 2014-10-16 NOTE — Therapy (Signed)
[place], [place], [address] Phone: [phone]   Fax:  [phone]  Physical Therapy Treatment  Patient Details  Name: Nicole Oliver MRN: [id number] Date of Birth: 18-Sep-1965 Referring Provider:  Gaynelle Arabian, MD  Encounter Date: 10/16/2014      PT End of Session - 10/16/14 0927    Visit Number 3   Number of Visits 16   Date for PT Re-Evaluation 11/29/14   PT Start Time 0845   PT Stop Time 0928   PT Time Calculation (min) 43 min   Activity Tolerance Patient tolerated treatment well   Behavior During Therapy [facility] for tasks assessed/performed      Past Medical History  Diagnosis Date  . Anxiety   . Elevated cholesterol     Past Surgical History  Procedure Laterality Date  . Eye surgery    . Nasal septum surgery    . Oophorectomy      BSO  . Abdominal hysterectomy  2005    TAH BSO endometriosis/leiomyoma    There were no vitals filed for this visit.  Visit Diagnosis:  Joint stiffness of knee, left  Muscle weakness of lower extremity  Difficulty in walking      Subjective Assessment - 10/16/14 0855    Subjective She reports some days pain better but stiffness more on some days   Currently in Pain? No/denies   Multiple Pain Sites No            [facility] PT Assessment - 10/16/14 0908    AROM   Left Knee Flexion 95   PROM   Right/Left Knee --  110 degrees flexion                      [facility] Adult PT Treatment/Exercise - 10/16/14 0857    Knee/Hip Exercises: Supine   Quad Sets AROM;Left;1 set;20 reps   Short Arc [employer] Strengthening;Left;2 sets;15 reps   Short Arc [employer] Limitations 3 pounds   Straight Leg Raises AROM;Left;1 set;15 reps   Knee/Hip Exercises: Prone   Hamstring Curl --  25 reps  95 degrees   Straight Leg Raises AROM;Left;1 set;20 reps   Manual Therapy   Manual Therapy Other (comment)   Joint Mobilization AP glides 2-3 for flexion range,  patell mobs all planes   Passive ROM flexion LT knee     Best range 110   Other Manual Therapy kineseotape in 2 fans for edema.                 PT Education - 10/16/14 0926    Education provided Yes   Education Details removal of kinieseotape and function of tape   Person(s) Educated Patient   Methods Explanation;Verbal cues   Comprehension Verbalized understanding          PT Short Term Goals - 10/09/14 1227    PT SHORT TERM GOAL #2   Title Left knee flexion improved to 90 degrees need for greater ease getting in/out of the car.     Status Achieved           PT Long Term Goals - 10/05/14 8366    PT LONG TERM GOAL #1   Title Patient will be independent in a progressive HEP needed for further ROM and strengthening of left LE.   Time 8   Period Weeks   Status New   PT LONG TERM GOAL #2   Title Left knee AROM  improved to 2-128 degrees needed for greater ease ascending and descending steps to enter/exit home.   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   Time 8   Period Weeks   Status New   PT LONG TERM GOAL #4   Title Patient will be able to ambulate 1000 feet needed for community ambulation.   Time 8   Period Weeks   Status New   PT LONG TERM GOAL #5   Title FOTO functional outcome score improved to 36% indicating improved function with less pain.   Time 8   Period Weeks   Status New               Plan - 10/16/14 5456    Clinical Impression Statement she continues to improve with range and muscle contraction. no pain on leaving today   PT Next Visit Plan Progress range and strength as tolerated   Consulted and Agree with Plan of Care Patient        Problem List Patient Active Problem List   Diagnosis Date Noted  . Finger pain 04/13/2012    Darrel Hoover PT 10/16/2014, 9:28  AM  [facility] [address] [place], [place], [address] Phone: [phone]   Fax:  [phone]

## 2014-10-17 ENCOUNTER — Ambulatory Visit: Payer: 59 | Admitting: Physical Therapy

## 2014-10-18 ENCOUNTER — Ambulatory Visit: Payer: 59 | Admitting: Physical Therapy

## 2014-10-18 DIAGNOSIS — R262 Difficulty in walking, not elsewhere classified: Secondary | ICD-10-CM

## 2014-10-18 DIAGNOSIS — S83512A Sprain of anterior cruciate ligament of left knee, initial encounter: Secondary | ICD-10-CM

## 2014-10-18 DIAGNOSIS — S83512D Sprain of anterior cruciate ligament of left knee, subsequent encounter: Secondary | ICD-10-CM | POA: Diagnosis not present

## 2014-10-18 DIAGNOSIS — M6281 Muscle weakness (generalized): Secondary | ICD-10-CM

## 2014-10-18 DIAGNOSIS — M25662 Stiffness of left knee, not elsewhere classified: Secondary | ICD-10-CM

## 2014-10-18 NOTE — Therapy (Signed)
[place], [place], [address] Phone: [phone]   Fax:  [phone]  Physical Therapy Treatment  Patient Details  Name: Nicole Oliver MRN: [id number] Date of Birth: February 02, 1966 Referring Provider:  Gaynelle Arabian, MD  Encounter Date: 10/18/2014      PT End of Session - 10/18/14 1231    Visit Number 4   Number of Visits 16   Date for PT Re-Evaluation 11/29/14   PT Start Time 6962   PT Stop Time 1243   PT Time Calculation (min) 60 min      Past Medical History  Diagnosis Date  . Anxiety   . Elevated cholesterol     Past Surgical History  Procedure Laterality Date  . Eye surgery    . Nasal septum surgery    . Oophorectomy      BSO  . Abdominal hysterectomy  2005    TAH BSO endometriosis/leiomyoma    There were no vitals filed for this visit.  Visit Diagnosis:  Joint stiffness of knee, left  Muscle weakness of lower extremity  Difficulty in walking  ACL tear, left, initial encounter      Subjective Assessment - 10/18/14 1145    Subjective 3/10 pain. It feels like ithis process is never going to end.   Currently in Pain? Yes   Pain Score 3    Pain Location Knee   Pain Descriptors / Indicators Pressure   Aggravating Factors  prolonged weight bearing   Pain Relieving Factors ice, elevation                         [facility] Adult PT Treatment/Exercise - 10/18/14 1202    Ambulation/Gait   Ambulation/Gait Yes   Ambulation/Gait Assistance 6: Modified independent (Device/Increase time)   Ambulation Distance (Feet) 50 Feet   Assistive device Crutches  unilateral   Gait Pattern Step-through pattern   Ambulation Surface Level;Indoor   Knee/Hip Exercises: Aerobic   Stationary Bike Nustpel L1 x 5 min LE only   Knee/Hip Exercises: Standing   Knee Flexion AROM;1 set;10 reps   Knee/Hip Exercises: Seated   Long Arc Quad AROM;1 set;10 reps   Knee/Hip Exercises: Supine   Quad Sets AROM;Left;1 set;20 reps   Short Arc [employer] Strengthening;Left;3 sets;15 reps   Short Arc Quad Sets Limitations 3#   Straight Leg Raises 1 set;20 reps   Knee/Hip Exercises: Sidelying   Hip ABduction 20 reps   Modalities   Modalities Cryotherapy   Cryotherapy   Number Minutes Cryotherapy 15 Minutes   Cryotherapy Location Knee   Type of Cryotherapy Other (comment)  vaso medium pressure 32 degrees                  PT Short Term Goals - 10/18/14 1228    PT SHORT TERM GOAL #1   Title Patient will have a good understanding of pain management and edema management self care techniques   Time 4   Period Weeks   Status Achieved   PT SHORT TERM GOAL #2   Title Left knee flexion improved to 90 degrees need for greater ease getting in/out of the car.     Time 4   Period Weeks   Status Achieved   PT SHORT TERM GOAL #3   Title Patient will be able to ambulate > 800 feet needed for grocery shopping.   Time 4   Period Weeks   Status On-going  PT SHORT TERM GOAL #4   Title Left knee extension improved to 6 degrees needed for greater ease with ambulation for return to work.   Time 4   Period Weeks   Status On-going           PT Long Term Goals - 10/18/14 1231    PT LONG TERM GOAL #1   Title Patient will be independent in a progressive HEP needed for further ROM and strengthening of left LE.   Time 8   Period Weeks   Status On-going   PT LONG TERM GOAL #2   Title Left knee AROM improved to 2-128 degrees needed for greater ease ascending and descending steps to enter/exit home.   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   Time 8   Period Weeks   Status On-going   PT LONG TERM GOAL #4   Title Patient will be able to ambulate 1000 feet needed for community ambulation.   Time 8   Period Weeks    Status On-going   PT LONG TERM GOAL #5   Title FOTO functional outcome score improved to 36% indicating improved function with less pain.   Time 8   Period Weeks   Status On-going               Plan - 10/18/14 1227    Clinical Impression Statement Pt able to ambulate with 1 crutch and hinge brace in clinic with step through pattern and no increased pain. Began Nustep today for ROM/strength of left knee.    PT Next Visit Plan Progress range and strength as tolerated, gait. Measure ROM        Problem List Patient Active Problem List   Diagnosis Date Noted  . Finger pain 04/13/2012    Dorene Ar, PTA 10/18/2014, 5:36 PM  [place], [place], [address] Phone: [phone]   Fax:  [phone]

## 2014-10-23 ENCOUNTER — Ambulatory Visit: Payer: 59 | Attending: Orthopaedic Surgery | Admitting: Physical Therapy

## 2014-10-23 DIAGNOSIS — M6281 Muscle weakness (generalized): Secondary | ICD-10-CM | POA: Insufficient documentation

## 2014-10-23 DIAGNOSIS — R262 Difficulty in walking, not elsewhere classified: Secondary | ICD-10-CM | POA: Diagnosis not present

## 2014-10-23 DIAGNOSIS — M25662 Stiffness of left knee, not elsewhere classified: Secondary | ICD-10-CM | POA: Insufficient documentation

## 2014-10-23 NOTE — Therapy (Signed)
[place], [place], [address] Phone: [phone]   Fax:  [phone]  Physical Therapy Treatment  Patient Details  Name: Nicole Oliver MRN: [id number] Date of Birth: [birth year] Referring Provider:  Gaynelle Arabian, MD  Encounter Date: 10/23/2014      PT End of Session - 10/23/14 1343    Visit Number 5   Number of Visits 16   Date for PT Re-Evaluation 11/29/14   PT Start Time 1150   PT Stop Time 0923   PT Time Calculation (min) 65 min   Activity Tolerance Patient limited by pain;Patient tolerated treatment well      Past Medical History  Diagnosis Date  . Anxiety   . Elevated cholesterol     Past Surgical History  Procedure Laterality Date  . Eye surgery    . Nasal septum surgery    . Oophorectomy      BSO  . Abdominal hysterectomy  2005    TAH BSO endometriosis/leiomyoma    There were no vitals filed for this visit.  Visit Diagnosis:  Joint stiffness of knee, left  Muscle weakness of lower extremity  Difficulty in walking      Subjective Assessment - 10/23/14 1316    Subjective Stiff and sore  , elevates and uses ice, exercises 3 times a day 20 minutes each.   Currently in Pain? Yes   Pain Score 4    Pain Location Knee   Pain Orientation Left   Pain Descriptors / Indicators --  stiff, knee cap moves out of position, knee locks   Aggravating Factors  weight bearing, ROM   Pain Relieving Factors ice, elevation   Effect of Pain on Daily Activities not yet in community except dinning   Multiple Pain Sites No                         [facility] Adult PT Treatment/Exercise - 10/23/14 1150    Ambulation/Gait   Ambulation/Gait Yes   Ambulation/Gait Assistance --  1 crutch, decreased weightbearing noted, Lt   Assistive device --  1 crutch   Gait Pattern Step-through pattern  step lengths uneven, cued for longer stride RT   Gait Comments Not quite ready for 1 crutch.    Knee/Hip Exercises: Supine   Quad Sets Strengthening  cues technique, small towel needed due to decreased extensio   Quad Sets Limitations VMO sluggish   Heel Slides 10 reps  painful   Other Supine Knee Exercises isometric hamstring 10 reps 5 seconds each   Cryotherapy   Number Minutes Cryotherapy 15 Minutes   Cryotherapy Location Knee   Type of Cryotherapy --  vasopneumatic , moderate pressure,    Manual Therapy   Manual Therapy --  retrograde at groin to knee, then taping for edema,    Ankle Exercises: Seated   Heel Slides 10 reps  foot on pillow case AA with Rt leg, painful                PT Education - 10/23/14 1326    Education provided Yes   Education Details how the lymph system, knee anatomy, ACL, Meniscus.   Person(s) Educated Patient   Methods Explanation;Demonstration   Comprehension Verbalized understanding          PT Short Term Goals - 10/23/14 1346    PT SHORT TERM GOAL #1   Title Patient will have a good understanding of pain management and edema  management self care techniques   Status Achieved   PT SHORT TERM GOAL #2   Title Left knee flexion improved to 90 degrees need for greater ease getting in/out of the car.     Time 4   Period Weeks   Status Achieved   PT SHORT TERM GOAL #3   Title Patient will be able to ambulate > 800 feet needed for grocery shopping.   Baseline not back to grocery shopping   Time 4   Period Weeks   Status On-going   PT SHORT TERM GOAL #4   Title Left knee extension improved to 6 degrees needed for greater ease with ambulation for return to work.   Time 4   Period Weeks   Status On-going           PT Long Term Goals - 10/18/14 1231    PT LONG TERM GOAL #1   Title Patient will be independent in a progressive HEP needed for further ROM and strengthening of left LE.   Time 8   Period Weeks   Status On-going   PT LONG TERM GOAL #2   Title Left knee AROM improved to 2-128 degrees needed for greater ease  ascending and descending steps to enter/exit home.   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   Time 8   Period Weeks   Status On-going   PT LONG TERM GOAL #4   Title Patient will be able to ambulate 1000 feet needed for community ambulation.   Time 8   Period Weeks   Status On-going   PT LONG TERM GOAL #5   Title FOTO functional outcome score improved to 36% indicating improved function with less pain.   Time 8   Period Weeks   Status On-going               Plan - 10/23/14 1344    Clinical Impression Statement Pain limiting, edema limiting.  Adherent with home exercise, 90 degrees flexion, AROM, extension limited but not measured today, estimated -20 degrees.   PT Next Visit Plan Progress range and strength as tolerated, gait. Measure ROM        Problem List Patient Active Problem List   Diagnosis Date Noted  . Finger pain 04/13/2012    Hally Colella 10/23/2014, 1:49 PM  [facility] [address] [place], [place], [address] Phone: [phone]   Fax:  [phone]   Melvenia Needles, PTA 10/23/2014 1:49 PM Phone: [phone] Fax: [phone]

## 2014-10-23 NOTE — Patient Instructions (Signed)
remove tape if irritating,\continue retrograde, ice, elevation.

## 2014-10-25 ENCOUNTER — Ambulatory Visit: Payer: 59 | Admitting: Physical Therapy

## 2014-10-25 DIAGNOSIS — M25662 Stiffness of left knee, not elsewhere classified: Secondary | ICD-10-CM

## 2014-10-25 DIAGNOSIS — R262 Difficulty in walking, not elsewhere classified: Secondary | ICD-10-CM

## 2014-10-25 DIAGNOSIS — M6281 Muscle weakness (generalized): Secondary | ICD-10-CM

## 2014-10-25 NOTE — Therapy (Signed)
[place], [place], [address] Phone: [phone]   Fax:  [phone]  Patient Details  Name: Nicole Oliver MRN: [id number] Date of Birth: [birth year] Referring Provider:  Gaynelle Arabian, MD  Encounter Date: 10/25/2014   Nicole Oliver 10/25/2014, 2:20 PM  [facility] [address] [place], [place], [address] Phone: [phone]   Fax:  [phone]

## 2014-10-25 NOTE — Patient Instructions (Signed)
Straight Leg Raise   Tighten stomach and slowly raise locked right leg __12_ inches from floor. Repeat __10 to 30__ times per set. Do 1-3 __ sets per session. Do 1-3__ sessions per day.  http://orth.exer.us/1103   Copyright  VHI. All rights reserved.

## 2014-10-30 ENCOUNTER — Ambulatory Visit: Payer: 59 | Admitting: Physical Therapy

## 2014-10-30 DIAGNOSIS — M25662 Stiffness of left knee, not elsewhere classified: Secondary | ICD-10-CM

## 2014-10-30 DIAGNOSIS — M6281 Muscle weakness (generalized): Secondary | ICD-10-CM

## 2014-10-30 DIAGNOSIS — S83512A Sprain of anterior cruciate ligament of left knee, initial encounter: Secondary | ICD-10-CM

## 2014-10-30 DIAGNOSIS — R262 Difficulty in walking, not elsewhere classified: Secondary | ICD-10-CM

## 2014-10-30 NOTE — Therapy (Signed)
[place], [place], [address] Phone: [phone]   Fax:  [phone]  Physical Therapy Treatment  Patient Details  Name: ARIDAY BRINKER MRN: [id number] Date of Birth: [birth year] Referring Provider:  Gaynelle Arabian, MD  Encounter Date: 10/30/2014      PT End of Session - 10/30/14 1536    Activity Tolerance Patient tolerated treatment well;Patient limited by pain      Past Medical History  Diagnosis Date  . Anxiety   . Elevated cholesterol     Past Surgical History  Procedure Laterality Date  . Eye surgery    . Nasal septum surgery    . Oophorectomy      BSO  . Abdominal hysterectomy  2005    TAH BSO endometriosis/leiomyoma    There were no vitals filed for this visit.  Visit Diagnosis:  Joint stiffness of knee, left  Muscle weakness of lower extremity  ACL tear, left, initial encounter  Difficulty in walking      Subjective Assessment - 10/30/14 0938    Subjective Has good days and then may do too much it hurts.  Tries to use 1 crutch at times.  wears brace for all walking sleeping.  Off only for bathing.  Able to stop sleeping in immobilizer.  Less stiff in am without sleeping in immobilizer.   Pain Score 4   up to 8/10   Pain Location Knee   Pain Orientation Left;Anterior;Posterior;Medial   Pain Descriptors / Indicators Sharp  Feels like a nail going striaight  into knee   Pain Radiating Towards thigh   Aggravating Factors  walking, turning over, sometimes for no reason   Pain Relieving Factors tape ice   Multiple Pain Sites No                         [facility] Adult PT Treatment/Exercise - 10/30/14 1001    Knee/Hip Exercises: Supine   Quad Sets 10 reps   Quad Sets Limitations not painful today   Short Arc Quad Sets Left;3 sets;10 reps   Short Arc Quad Sets Limitations --  1 set 0 LBS , 2 sets 3 LBS   Heel Slides 10 reps   Heel Slides Limitations 104 degrees  AROM  If squeezed range improved to 118   Cryotherapy   Number Minutes Cryotherapy 10 Minutes   Cryotherapy Location Knee   Type of Cryotherapy --  cold pack   Manual Therapy   Manual Therapy --  taping replaced,  modified for meniscus distal knee 100%, Y    theraband green, knee flexion 2 sets 10 reps              PT Short Term Goals - 10/30/14 1538    PT SHORT TERM GOAL #3   Title Patient will be able to ambulate > 800 feet needed for grocery shopping.   Time 4   Status On-going   PT SHORT TERM GOAL #4   Title Left knee extension improved to 6 degrees needed for greater ease with ambulation for return to work.   Time 4   Period Weeks   Status Achieved           PT Long Term Goals - 10/30/14 1539    PT LONG TERM GOAL #1   Title Patient will be independent in a progressive HEP needed for further ROM and strengthening of left LE.   Time 8   Period Weeks  Status On-going   PT LONG TERM GOAL #2   Title Left knee AROM improved to 2-128 degrees needed for greater ease ascending and descending steps to enter/exit home.   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   Time 8   Period Weeks   Status On-going   PT LONG TERM GOAL #4   Title Patient will be able to ambulate 1000 feet needed for community ambulation.   Time 8   Period Weeks   Status On-going   PT LONG TERM GOAL #5   Title FOTO functional outcome score improved to 36% indicating improved function with less pain.   Time 8   Period Weeks   Status Unable to assess               Plan - 10/30/14 1536    Clinical Impression Statement Motion improves with patient squeezing knee.  Not sure why .  Patient still has a painful weak knee.  Edema improving.     PT Next Visit Plan Progress range and strength as tolerated, gait.  assess tape for  distal knee   Consulted and Agree with Plan of Care Patient        Problem List Patient Active Problem List   Diagnosis Date Noted  . Finger pain 04/13/2012    Izick Gasbarro 10/30/2014, 3:41 PM  [facility] [address] [place], [place], [address] Phone: [phone]   Fax:  [phone]   Melvenia Needles, PTA 10/30/2014 3:41 PM Phone: [phone] Fax: [phone]

## 2014-11-01 ENCOUNTER — Ambulatory Visit: Payer: 59 | Admitting: Physical Therapy

## 2014-11-01 DIAGNOSIS — M6281 Muscle weakness (generalized): Secondary | ICD-10-CM

## 2014-11-01 DIAGNOSIS — M25662 Stiffness of left knee, not elsewhere classified: Secondary | ICD-10-CM | POA: Diagnosis not present

## 2014-11-01 DIAGNOSIS — S83512A Sprain of anterior cruciate ligament of left knee, initial encounter: Secondary | ICD-10-CM

## 2014-11-01 DIAGNOSIS — R262 Difficulty in walking, not elsewhere classified: Secondary | ICD-10-CM

## 2014-11-01 NOTE — Therapy (Signed)
[place], [place], [address] Phone: [phone]   Fax:  [phone]  Physical Therapy Treatment  Patient Details  Name: Nicole Oliver MRN: [id number] Date of Birth: [birth year] Referring Provider:  Gaynelle Arabian, MD  Encounter Date: 11/01/2014      PT End of Session - 11/01/14 1150    Visit Number 8   Number of Visits 16   Date for PT Re-Evaluation 11/29/14   PT Start Time 1100   PT Stop Time 1212   PT Time Calculation (min) 72 min   Activity Tolerance Patient limited by pain      Past Medical History  Diagnosis Date  . Anxiety   . Elevated cholesterol     Past Surgical History  Procedure Laterality Date  . Eye surgery    . Nasal septum surgery    . Oophorectomy      BSO  . Abdominal hysterectomy  2005    TAH BSO endometriosis/leiomyoma    There were no vitals filed for this visit.  Visit Diagnosis:  Joint stiffness of knee, left  Muscle weakness of lower extremity  ACL tear, left, initial encounter  Difficulty in walking      Subjective Assessment - 11/01/14 1109    Subjective Continues to have sharp shooting pains several times a day.  Painful with walking so uses 2 crutches at present but tries 1 crutch at home.  I still feel like my knee will buckle. Still icing and elevating.  Sees MD 6/6.     Currently in Pain? Yes   Pain Score 2    Pain Location Knee   Pain Orientation Left   Pain Descriptors / Indicators Sharp   Pain Type Acute pain   Aggravating Factors  walking, over exercise   Pain Relieving Factors ice, elevation                         [facility] Adult PT Treatment/Exercise - 11/01/14 1112    Knee/Hip Exercises: Aerobic   Stationary Bike Nu-Step L3 10 min   Knee/Hip Exercises: Standing   Heel Raises 20 reps   Terminal Knee Extension Strengthening;Left;15 reps;Theraband   Theraband Level (Terminal Knee Extension) Level 3 (Green)   SLS with  Vectors WB on left right side step 15x   Other Standing Knee Exercises weight shifting side to side 1 min; box weight shifting 1 minutes   Knee/Hip Exercises: Seated   Other Seated Knee Exercises red band hip abd/ER 20x   Knee/Hip Exercises: Sidelying   Clams red band 25x   Cryotherapy   Number Minutes Cryotherapy 15 Minutes   Cryotherapy Location Knee   Type of Cryotherapy --  vasocompression moderate 32 degrees   Ankle Exercises: Machines for Strengthening   Cybex Leg Press 20# B 20x. Left LE only                  PT Short Term Goals - 11/01/14 1157    PT SHORT TERM GOAL #1   Title Patient will have a good understanding of pain management and edema management self care techniques   Status Achieved   PT SHORT TERM GOAL #2   Title Left knee flexion improved to 90 degrees need for greater ease getting in/out of the car.     PT SHORT TERM GOAL #3   Title Patient will be able to ambulate > 800 feet needed for grocery shopping.   Period Weeks  PT SHORT TERM GOAL #4   Title Left knee extension improved to 6 degrees needed for greater ease with ambulation for return to work.   Status Achieved           PT Long Term Goals - 11/01/14 1157    PT LONG TERM GOAL #1   Title Patient will be independent in a progressive HEP needed for further ROM and strengthening of left LE.   Time 8   Period Weeks   Status On-going   PT LONG TERM GOAL #2   Title Left knee AROM improved to 2-128 degrees needed for greater ease ascending and descending steps to enter/exit home.   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   Time 8   Period Weeks   Status On-going   PT LONG TERM GOAL #4   Title Patient will be able to ambulate 1000 feet needed for community ambulation.   Time 8   Period Weeks   Status On-going   PT LONG TERM  GOAL #5   Title FOTO functional outcome score improved to 36% indicating improved function with less pain.   Time 8   Period Weeks   Status On-going               Plan - 11/01/14 1150    Clinical Impression Statement Patient continues to be fearful of pain.  Progressed to low level closed chain exercises on leg press and standing.  Patient uses 2 hand support throughout all standing exercises secondary to lack of full weight bearing on left LE.  Patient reports some knee discomfort after leg press but this seemed to resolve with working a different muscle group following.  Mild edema improved following vasocompression.   PT Next Visit Plan Continue with low level closed chain exercises;  cryotherapy;  patient to see the doctor in 3 weeks        Problem List Patient Active Problem List   Diagnosis Date Noted  . Finger pain 04/13/2012    [name] 11/01/2014, 11:59 AM  [facility] [address] [place], [place], [address] Phone: [phone]   Fax:  [phone]   Ruben Im, PT 11/01/2014 11:59 AM Phone: [phone] Fax: [phone]

## 2014-11-06 ENCOUNTER — Ambulatory Visit: Payer: 59 | Admitting: Physical Therapy

## 2014-11-06 DIAGNOSIS — M25662 Stiffness of left knee, not elsewhere classified: Secondary | ICD-10-CM

## 2014-11-06 DIAGNOSIS — M6281 Muscle weakness (generalized): Secondary | ICD-10-CM

## 2014-11-06 DIAGNOSIS — R262 Difficulty in walking, not elsewhere classified: Secondary | ICD-10-CM

## 2014-11-06 NOTE — Patient Instructions (Signed)
Call  MD about leg numbness.

## 2014-11-06 NOTE — Therapy (Signed)
[place], [place], [address] Phone: [phone]   Fax:  [phone]  Physical Therapy Treatment  Patient Details  Name: Nicole Oliver MRN: [id number] Date of Birth: 09-16-1965 Referring Provider:  Gaynelle Arabian, MD  Encounter Date: 11/06/2014      PT End of Session - 11/06/14 1351    Visit Number 9   Number of Visits 16   Date for PT Re-Evaluation 11/29/14   PT Start Time 0930   PT Stop Time 1030   PT Time Calculation (min) 60 min   Activity Tolerance Patient tolerated treatment well;Patient limited by pain   Behavior During Therapy [facility] for tasks assessed/performed      Past Medical History  Diagnosis Date  . Anxiety   . Elevated cholesterol     Past Surgical History  Procedure Laterality Date  . Eye surgery    . Nasal septum surgery    . Oophorectomy      BSO  . Abdominal hysterectomy  2005    TAH BSO endometriosis/leiomyoma    There were no vitals filed for this visit.  Visit Diagnosis:  Joint stiffness of knee, left  Muscle weakness of lower extremity  Difficulty in walking      Subjective Assessment - 11/06/14 1349    Subjective      Past 3 days leg numb most of the evening, feels weak, feels like she has to shake it back to life.  Brace is not tight.  she does not think it is related to swelling.  She does not have back pain.   Pain Descriptors / Indicators --  numb, tingling, since the last 3 days, most of the day yesterday.  IT feels like my foot is not there     Better with rubbing and elevation.  It is worse with increased time on her feet.                    [facility] Adult PT Treatment/Exercise - 11/06/14 0954    Ambulation/Gait   Ambulation/Gait Yes   Ambulation Distance (Feet) --  around building, on level 200+ feet.  cues initially   Assistive device Crutches   Gait Pattern --  step through   Ambulation Surface --  level.   Gait Comments raised  ctutches, hand grips 1  level  posture and technique improved   Knee/Hip Exercises: Standing   Heel Raises 10 reps  both   Hip ADduction AROM;Both;1 set  added Standing hip SLR 5 ways. to home exercise program ..    Hip ADduction Limitations --   Other Standing Knee Exercises Hip 3 way 10 reps each.  these were difficult   Knee/Hip Exercises: Seated   Heel Slides 2 sets;10 reps  foot on towel   Cryotherapy   Number Minutes Cryotherapy 15 Minutes   Cryotherapy Location --  Knee   Type of Cryotherapy --  vasopneumatic, moderate pressure, 32 degrees.elevated   Manual Therapy   Other Manual Therapy patched loose tape     Pre gait weight shifts side to side, diagonals hand hold assist. Rests needed.Brace on for all exercise.           PT Education - 11/06/14 1351    Education provided Yes   Education Details gait training on level   Person(s) Educated Patient   Methods Explanation;Demonstration;Verbal cues   Comprehension Verbalized understanding;Returned demonstration          PT Short Term Goals -  11/01/14 1157    PT SHORT TERM GOAL #1   Title Patient will have a good understanding of pain management and edema management self care techniques   Status Achieved   PT SHORT TERM GOAL #2   Title Left knee flexion improved to 90 degrees need for greater ease getting in/out of the car.     PT SHORT TERM GOAL #3   Title Patient will be able to ambulate > 800 feet needed for grocery shopping.   Period Weeks   PT SHORT TERM GOAL #4   Title Left knee extension improved to 6 degrees needed for greater ease with ambulation for return to work.   Status Achieved           PT Long Term Goals - 11/01/14 1157    PT LONG TERM GOAL #1   Title Patient will be independent in a progressive HEP needed for further ROM and strengthening of left LE.   Time 8   Period Weeks   Status On-going   PT LONG TERM GOAL #2   Title Left knee AROM improved to 2-128 degrees needed for greater  ease ascending and descending steps to enter/exit home.   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   Time 8   Period Weeks   Status On-going   PT LONG TERM GOAL #4   Title Patient will be able to ambulate 1000 feet needed for community ambulation.   Time 8   Period Weeks   Status On-going   PT LONG TERM GOAL #5   Title FOTO functional outcome score improved to 36% indicating improved function with less pain.   Time 8   Period Weeks   Status On-going               Plan - 11/06/14 1352    Clinical Impression Statement Numbness a concern.  PT Ruben Im notified and she suggested patient call MD.  Progress toward home exercise goals with closed chain exercises.        Problem List Patient Active Problem List   Diagnosis Date Noted  . Finger pain 04/13/2012    HARRIS,KAREN 11/06/2014, 2:01 PM  [facility] [address] [place], [place], [address] Phone: [phone]   Fax:  [phone]   Melvenia Needles, PTA 11/06/2014 2:01 PM Phone: [phone] Fax: [phone]

## 2014-11-08 ENCOUNTER — Ambulatory Visit: Payer: 59 | Admitting: Physical Therapy

## 2014-11-08 DIAGNOSIS — M6281 Muscle weakness (generalized): Secondary | ICD-10-CM

## 2014-11-08 DIAGNOSIS — S83512A Sprain of anterior cruciate ligament of left knee, initial encounter: Secondary | ICD-10-CM

## 2014-11-08 DIAGNOSIS — M25662 Stiffness of left knee, not elsewhere classified: Secondary | ICD-10-CM

## 2014-11-08 DIAGNOSIS — R262 Difficulty in walking, not elsewhere classified: Secondary | ICD-10-CM

## 2014-11-08 NOTE — Therapy (Signed)
[place], [place], [address] Phone: [phone]   Fax:  [phone]  Physical Therapy Treatment  Patient Details  Name: RICHETTA CUBILLOS MRN: [id number] Date of Birth: 05/04/1966 Referring Provider:  Gaynelle Arabian, MD  Encounter Date: 11/08/2014      PT End of Session - 11/08/14 1021    Visit Number 10   Number of Visits 16   Date for PT Re-Evaluation 11/29/14   PT Start Time 0935   PT Stop Time 1030   PT Time Calculation (min) 55 min   Activity Tolerance Patient limited by pain      Past Medical History  Diagnosis Date  . Anxiety   . Elevated cholesterol     Past Surgical History  Procedure Laterality Date  . Eye surgery    . Nasal septum surgery    . Oophorectomy      BSO  . Abdominal hysterectomy  2005    TAH BSO endometriosis/leiomyoma    There were no vitals filed for this visit.  Visit Diagnosis:  Joint stiffness of knee, left  Muscle weakness of lower extremity  Difficulty in walking  ACL tear, left, initial encounter      Subjective Assessment - 11/08/14 0945    Subjective At the grocery store yesterday with crutches 1 hour.  States she did not call the doctor regarding the  numbness because she thinks it is coming from her back.  She reports she has been sleeping on the couch and they may be bothering it.  She has been stretching and that helps.     Currently in Pain? Yes   Pain Score 2    Pain Location Knee   Pain Orientation Left   Pain Type Acute pain   Pain Onset More than a month ago   Pain Frequency Intermittent   Aggravating Factors  prolonged walking and standing   Pain Relieving Factors ice, rest                         [facility] Adult PT Treatment/Exercise - 11/08/14 0948    Exercises   Exercises Knee/Hip;Ankle   Knee/Hip Exercises: Aerobic   Stationary Bike Nu-Step L4 8   Knee/Hip Exercises: Standing   Heel Raises 20 reps   Terminal  Knee Extension Strengthening;Left;15 reps;Theraband   Theraband Level (Terminal Knee Extension) Level 3 (Green)   Lateral Step Up Left;10 reps;Hand Hold: 2;Step Height: 2"   Forward Step Up Left;10 reps;Hand Hold: 1;Step Height: 2"   Knee/Hip Exercises: Seated   Other Seated Knee Exercises sit to stand no hands from mat table with 1 pillow 13x   Cryotherapy   Number Minutes Cryotherapy 15 Minutes   Cryotherapy Location Knee   Type of Cryotherapy --  vasocompression moderate 32 degrees   Ankle Exercises: Machines for Strengthening   Cybex Leg Press 20# B 20x. Left LE only 20# 10x                  PT Short Term Goals - 11/08/14 1027    PT SHORT TERM GOAL #1   Title Patient will have a good understanding of pain management and edema management self care techniques   Status Achieved   PT SHORT TERM GOAL #2   Title Left knee flexion improved to 90 degrees need for greater ease getting in/out of the car.     Status Achieved   PT SHORT TERM GOAL #3  Title Patient will be able to ambulate > 800 feet needed for grocery shopping.   Status Achieved   PT SHORT TERM GOAL #4   Title Left knee extension improved to 6 degrees needed for greater ease with ambulation for return to work.   Status Achieved           PT Long Term Goals - 11/08/14 1028    PT LONG TERM GOAL #1   Title Patient will be independent in a progressive HEP needed for further ROM and strengthening of left LE.   Time 8   Period Weeks   Status On-going   PT LONG TERM GOAL #2   Title Left knee AROM improved to 2-128 degrees needed for greater ease ascending and descending steps to enter/exit home.   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   Time 8   Period Weeks   Status On-going   PT LONG TERM GOAL #4   Title Patient will be able to ambulate  1000 feet needed for community ambulation.   Time 8   Period Weeks   Status On-going   PT LONG TERM GOAL #5   Title FOTO functional outcome score improved to 36% indicating improved function with less pain.   Time 8   Period Weeks   Status On-going               Plan - 11/08/14 1022    Clinical Impression Statement All short term goals met.  Patient puts forth good effort with progressive closed chain strengthening despite intermittent increases in knee pain.  Therapist closely monitoring pain and modifying exercises accordingly.  Patient still dependent on bilateral crutches secondary to instability and pain.  Continue for 2 more weeks before MD follow up.     PT Next Visit Plan Continue with low to moderate level closed chain exercises;  cryotherapy;  recheck knee AROM next visit        Problem List Patient Active Problem List   Diagnosis Date Noted  . Finger pain 04/13/2012    [name] 11/08/2014, 12:47 PM  [facility] [address] [place], [place], [address] Phone: [phone]   Fax:  [phone]   Ruben Im, [place] 11/08/2014 12:48 PM Phone: [phone] Fax: [phone]

## 2014-11-13 ENCOUNTER — Ambulatory Visit: Payer: 59 | Admitting: Physical Therapy

## 2014-11-13 DIAGNOSIS — R262 Difficulty in walking, not elsewhere classified: Secondary | ICD-10-CM

## 2014-11-13 DIAGNOSIS — M25662 Stiffness of left knee, not elsewhere classified: Secondary | ICD-10-CM | POA: Diagnosis not present

## 2014-11-13 DIAGNOSIS — M6281 Muscle weakness (generalized): Secondary | ICD-10-CM

## 2014-11-13 NOTE — Therapy (Signed)
[place], [place], [address] Phone: [phone]   Fax:  [phone]  Physical Therapy Treatment  Patient Details  Name: Nicole Oliver MRN: [id number] Date of Birth: [birth year] Referring Provider:  Gaynelle Arabian, MD  Encounter Date: 11/13/2014      PT End of Session - 11/13/14 1823    Visit Number 11   Number of Visits 16   Date for PT Re-Evaluation 11/29/14   PT Start Time 6629   PT Stop Time 1515   PT Time Calculation (min) 60 min   Activity Tolerance Patient tolerated treatment well;Patient limited by pain   Behavior During Therapy [facility] for tasks assessed/performed      Past Medical History  Diagnosis Date  . Anxiety   . Elevated cholesterol     Past Surgical History  Procedure Laterality Date  . Eye surgery    . Nasal septum surgery    . Oophorectomy      BSO  . Abdominal hysterectomy  2005    TAH BSO endometriosis/leiomyoma    There were no vitals filed for this visit.  Visit Diagnosis:  Joint stiffness of knee, left  Muscle weakness of lower extremity  Difficulty in walking      Subjective Assessment - 11/13/14 1422    Subjective No pain today it feels tight.  Some ways she steps a certain way and a sharp pain goes though it.. 3-4/10 at end of the day.  Sharp apin 7-8/10 lasts 10 seconds. Uses 1 crutche.  cold helps pain   Currently in Pain? Yes  see above   Pain Radiating Towards Lateral kneecap   Pain Relieving Factors ice   Multiple Pain Sites No                         [facility] Adult PT Treatment/Exercise - 11/13/14 0001    Knee/Hip Exercises: Aerobic   Stationary Bike 7.5 minutes L5   Knee/Hip Exercises: Standing   Heel Raises 20 reps  unable to do 1 leg.   Lateral Step Up 20 reps   Forward Step Up --  20 reps.  Cues to use hip muscles more.   SLS unable   Knee/Hip Exercises: Supine   Quad Sets 10 reps   Quad Sets Limitations lateral tracking  noted.     Cryotherapy   Number Minutes Cryotherapy 15 Minutes   Cryotherapy Location Knee   Type of Cryotherapy --  vasopneumatic   Manual Therapy   Manual Therapy --  taping for lateral tracking prevention.  Leg felt stronger   Other Manual Therapy taping for patellar tracking    Ankle Exercises: Machines for Strengthening   Cybex Leg Press --  20 LBS. 2 legs, 10 reps, 1 leg 20 reps +  cued                  PT Short Term Goals - 11/08/14 1027    PT SHORT TERM GOAL #1   Title Patient will have a good understanding of pain management and edema management self care techniques   Status Achieved   PT SHORT TERM GOAL #2   Title Left knee flexion improved to 90 degrees need for greater ease getting in/out of the car.     Status Achieved   PT SHORT TERM GOAL #3   Title Patient will be able to ambulate > 800 feet needed for grocery shopping.   Status Achieved  PT SHORT TERM GOAL #4   Title Left knee extension improved to 6 degrees needed for greater ease with ambulation for return to work.   Status Achieved           PT Long Term Goals - 11/08/14 1028    PT LONG TERM GOAL #1   Title Patient will be independent in a progressive HEP needed for further ROM and strengthening of left LE.   Time 8   Period Weeks   Status On-going   PT LONG TERM GOAL #2   Title Left knee AROM improved to 2-128 degrees needed for greater ease ascending and descending steps to enter/exit home.   Time 8   Period Weeks   Status On-going   PT LONG TERM GOAL #3   Title Left quad and HS strength grossly 4-/5 needed for standing, short distance community ambulation for return to work in [facility] [employer].   Time 8   Period Weeks   Status On-going   PT LONG TERM GOAL #4   Title Patient will be able to ambulate 1000 feet needed for community ambulation.   Time 8   Period Weeks   Status On-going   PT LONG TERM GOAL #5   Title FOTO functional outcome score improved to 36% indicating improved  function with less pain.   Time 8   Period Weeks   Status On-going               Plan - 11/13/14 1824    Clinical Impression Statement lateral tracking patella taped with improved weightbearing through leg.  1-2/10 pain post exercise prior to cold.  Grith mid patella 15 3/4 "     PT Next Visit Plan tape prior to exercise?   Consulted and Agree with Plan of Care Patient        Problem List Patient Active Problem List   Diagnosis Date Noted  . Finger pain 04/13/2012    Nicole Oliver 11/13/2014, 6:26 PM  [facility] [address] [place], [place], [address] Phone: [phone]   Fax:  [phone] Nicole Oliver, PTA 11/13/2014 6:26 PM Phone: [phone] Fax: [phone]

## 2014-11-13 NOTE — Patient Instructions (Signed)
Remove tape if irritating, otherwise remove tomorrow evening.

## 2014-11-15 ENCOUNTER — Ambulatory Visit: Payer: 59 | Admitting: Physical Therapy

## 2014-11-15 DIAGNOSIS — M25662 Stiffness of left knee, not elsewhere classified: Secondary | ICD-10-CM | POA: Diagnosis not present

## 2014-11-15 DIAGNOSIS — R262 Difficulty in walking, not elsewhere classified: Secondary | ICD-10-CM

## 2014-11-15 DIAGNOSIS — M6281 Muscle weakness (generalized): Secondary | ICD-10-CM

## 2014-11-15 NOTE — Therapy (Signed)
[place], [place], [address] Phone: [phone]   Fax:  [phone]  Physical Therapy Treatment  Patient Details  Name: Nicole Oliver MRN: [id number] Date of Birth: 08-06-1965 Referring Provider:  Gaynelle Arabian, MD  Encounter Date: 11/15/2014      PT End of Session - 11/15/14 1744    Visit Number 12   Number of Visits 16   Date for PT Re-Evaluation 11/29/14   PT Start Time 6962   PT Stop Time 1648   PT Time Calculation (min) 61 min   Activity Tolerance Patient tolerated treatment well;Patient limited by pain   Behavior During Therapy [facility] for tasks assessed/performed      Past Medical History  Diagnosis Date  . Anxiety   . Elevated cholesterol     Past Surgical History  Procedure Laterality Date  . Eye surgery    . Nasal septum surgery    . Oophorectomy      BSO  . Abdominal hysterectomy  2005    TAH BSO endometriosis/leiomyoma    There were no vitals filed for this visit.  Visit Diagnosis:  Joint stiffness of knee, left  Muscle weakness of lower extremity  Difficulty in walking      Subjective Assessment - 11/15/14 1551    Subjective 2-3/10 constant dull ach.  Tape helped knee feel stable.   Currently in Pain? Yes   Pain Location Knee   Pain Orientation Left;Lateral   Pain Descriptors / Indicators Dull;Aching  tight   Aggravating Factors  longer walking   Pain Relieving Factors ice rest   Multiple Pain Sites No                         [facility] Adult PT Treatment/Exercise - 11/15/14 1605    Ambulation/Gait   Ambulation/Gait Yes   Ambulation Distance (Feet) 300 Feet   Assistive device --  1 crutch   Gait Pattern Step-through pattern   Ambulation Surface Level;Indoor   Gait Comments able to press 120 LBS throught leg using scale.  Good technique with 1 crutch.   Knee/Hip Exercises: Aerobic   Stationary Bike 6 minutes. L6/ Bike 2 minutes L1   Knee/Hip  Exercises: Standing   Forward Step Up --  4 inches 10 reps, increased weight bearing noted.   Knee/Hip Exercises: Supine   Quad Sets 10 reps  with quad sets.   Cryotherapy   Number Minutes Cryotherapy 15 Minutes   Cryotherapy Location Knee   Type of Cryotherapy --  Vasopneumatic, moderate pressure 32 degrees.  Leg elevated   Manual Therapy   Other Manual Therapy taping for patellar tracking   also taught patient how to tape her knee                PT Education - 11/15/14 1743    Education provided Yes   Education Details gait training on level, 1 crutch.  How to tape for lateral tracking prevention.   Person(s) Educated Patient   Methods Explanation;Demonstration;Verbal cues   Comprehension Verbalized understanding;Returned demonstration          PT Short Term Goals - 11/08/14 1027    PT SHORT TERM GOAL #1   Title Patient will have a good understanding of pain management and edema management self care techniques   Status Achieved   PT SHORT TERM GOAL #2   Title Left knee flexion improved to 90 degrees need for greater ease getting in/out  of the car.     Status Achieved   PT SHORT TERM GOAL #3   Title Patient will be able to ambulate > 800 feet needed for grocery shopping.   Status Achieved   PT SHORT TERM GOAL #4   Title Left knee extension improved to 6 degrees needed for greater ease with ambulation for return to work.   Status Achieved           PT Long Term Goals - 11/08/14 1028    PT LONG TERM GOAL #1   Title Patient will be independent in a progressive HEP needed for further ROM and strengthening of left LE.   Time 8   Period Weeks   Status On-going   PT LONG TERM GOAL #2   Title Left knee AROM improved to 2-128 degrees needed for greater ease ascending and descending steps to enter/exit home.   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   Time 8   Period Weeks   Status On-going   PT LONG TERM GOAL #4   Title Patient will be able to ambulate 1000 feet needed for community ambulation.   Time 8   Period Weeks   Status On-going   PT LONG TERM GOAL #5   Title FOTO functional outcome score improved to 36% indicating improved function with less pain.   Time 8   Period Weeks   Status On-going               Plan - 11/15/14 1745    Clinical Impression Statement Confidence improving with weight bearing.  Able to wean from 1 crutch to 1 crutch in clinic.  1-2/10 pain post session.  Unchanged.   PT Next Visit Plan tape, try a cane for gait.  continue strengthening. closed chain   Consulted and Agree with Plan of Care Patient        Problem List Patient Active Problem List   Diagnosis Date Noted  . Finger pain 04/13/2012    Dariusz Brase 11/15/2014, 5:48 PM  [facility] [address] [place], [place], [address] Phone: [phone]   Fax:  [phone] Melvenia Needles, PTA 11/15/2014 5:48 PM Phone: [phone] Fax: [phone]

## 2014-11-20 ENCOUNTER — Ambulatory Visit: Payer: 59 | Admitting: Physical Therapy

## 2014-11-22 ENCOUNTER — Ambulatory Visit: Payer: 59 | Attending: Orthopaedic Surgery | Admitting: Physical Therapy

## 2014-11-22 ENCOUNTER — Ambulatory Visit: Payer: 59 | Admitting: Physical Therapy

## 2014-11-22 DIAGNOSIS — S83512A Sprain of anterior cruciate ligament of left knee, initial encounter: Secondary | ICD-10-CM | POA: Insufficient documentation

## 2014-11-22 DIAGNOSIS — X58XXXA Exposure to other specified factors, initial encounter: Secondary | ICD-10-CM | POA: Insufficient documentation

## 2014-11-22 DIAGNOSIS — M6281 Muscle weakness (generalized): Secondary | ICD-10-CM | POA: Diagnosis present

## 2014-11-22 DIAGNOSIS — R262 Difficulty in walking, not elsewhere classified: Secondary | ICD-10-CM | POA: Diagnosis present

## 2014-11-22 DIAGNOSIS — M25662 Stiffness of left knee, not elsewhere classified: Secondary | ICD-10-CM | POA: Diagnosis not present

## 2014-11-22 NOTE — Therapy (Signed)
[place], [place], [address] Phone: [phone]   Fax:  [phone]  Physical Therapy Treatment/Recertification  Patient Details  Name: Nicole Oliver MRN: [id number] Date of Birth: 07/31/1965 Referring Provider:  Gaynelle Arabian, MD  Encounter Date: 11/22/2014      PT End of Session - 11/22/14 1400    Visit Number 13   Number of Visits 21   Date for PT Re-Evaluation 12/20/14   Authorization Type [facility]   PT Start Time 1330   PT Stop Time 1430   PT Time Calculation (min) 60 min   Activity Tolerance Patient limited by pain      Past Medical History  Diagnosis Date  . Anxiety   . Elevated cholesterol     Past Surgical History  Procedure Laterality Date  . Eye surgery    . Nasal septum surgery    . Oophorectomy      BSO  . Abdominal hysterectomy  2005    TAH BSO endometriosis/leiomyoma    There were no vitals filed for this visit.  Visit Diagnosis:  Joint stiffness of knee, left - Plan: PT plan of care cert/re-cert  Muscle weakness of lower extremity - Plan: PT plan of care cert/re-cert  Difficulty in walking - Plan: PT plan of care cert/re-cert  ACL tear, left, initial encounter - Plan: PT plan of care cert/re-cert      Subjective Assessment - 11/22/14 1333    Subjective States she did well last visit and was able to walk with one crutch and step up on a 4 inch step but then noted an increase in pain in the next few days, "it was killing me."  States her knee feels unstable walking down a ramp.  Sees the doctor on Monday.     How long can you walk comfortably? 30 min with crutches   Currently in Pain? Yes   Pain Score 3    Pain Location Knee   Pain Orientation Left   Pain Descriptors / Indicators Aching  sharp, shooting while at times while walking   Pain Type Acute pain   Aggravating Factors  longer walking   Pain Relieving Factors ice and elevation, massaging it             [facility] PT Assessment - 11/22/14 1336    Observation/Other Assessments   Focus on Therapeutic Outcomes (FOTO)  54%   AROM   Left Knee Extension 0   Left Knee Flexion 128   Strength   Strength Assessment Site --  Patient unable to single leg stand for fear of giveway   Left Hip Flexion 5/5   Left Hip ABduction 5/5   Left Knee Flexion 3+/5   Left Knee Extension 3+/5                     [facility] Adult PT Treatment/Exercise - 11/22/14 1348    Knee/Hip Exercises: Aerobic   Stationary Bike L5 8 min LEs only   Knee/Hip Exercises: Standing   Heel Raises 20 reps   Forward Step Up Left;15 reps;Hand Hold: 1;Step Height: 4"   Other Standing Knee Exercises Hip 4 ways WB on left 12x   Other Standing Knee Exercises sit to stand 15x no UE assist   Knee/Hip Exercises: Supine   Other Supine Knee Exercises isometric hamstring 10 reps 5 seconds each  on ball   Knee/Hip Exercises: Prone   Other Prone Exercises planks 5x 5 sec holds  Cryotherapy   Number Minutes Cryotherapy 15 Minutes   Cryotherapy Location Knee   Type of Cryotherapy --  vasocompression mod compress 32 degrees                  PT Short Term Goals - 11/22/14 1342    PT SHORT TERM GOAL #1   Title Patient will have a good understanding of pain management and edema management self care techniques   Status Achieved   PT SHORT TERM GOAL #2   Title Left knee flexion improved to 90 degrees need for greater ease getting in/out of the car.     Status Achieved   PT SHORT TERM GOAL #3   Title Patient will be able to ambulate > 800 feet needed for grocery shopping.   Status Achieved   PT SHORT TERM GOAL #4   Title Left knee extension improved to 6 degrees needed for greater ease with ambulation for return to work.   Status Achieved           PT Long Term Goals - 11/22/14 1342    PT LONG TERM GOAL #1   Title Patient will be independent in a progressive HEP needed for further ROM and strengthening of left  LE.   Time 8   Period Weeks   Status On-going   PT LONG TERM GOAL #2   Title Left knee AROM improved to 2-128 degrees needed for greater ease ascending and descending steps to enter/exit home.   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   Period Weeks   Status On-going   PT LONG TERM GOAL #4   Title Patient will be able to ambulate 1000 feet needed for community ambulation.   Status Achieved   PT LONG TERM GOAL #5   Title FOTO functional outcome score improved to 36% indicating improved function with less pain.   Baseline Improved slightly from 58% to 54%   Time 8   Period Weeks   Status On-going               Plan - 11/22/14 1349    Clinical Impression Statement Although patient has made progress with PT with knee AROM, now 0-128 degrees, she continues to complain of dull achiness and at times with walking she has sharp, shooting knee pain and frequent sensations of knee give-way.  She is therefore dependent on 2 crutches for community ambulation.  Around the house she does use only 1 crutch.  She continues to be unable to work, walk more than 30 minutes and is unable to do more physical activities like kayaking.  Patient has not improved as much as expected.  Recommend follow up with the doctor regarding continued deficits.  We will be happy to continue PT services if no other medical intervention is indicated.     PT Next Visit Plan see how MD appt went;  continue as needed with closed chain strengthening        Problem List Patient Active Problem List   Diagnosis Date Noted  . Finger pain 04/13/2012    Nicole Oliver 11/22/2014, 3:43 PM  [facility] [address] [place], [place], [address] Phone: [phone]   Fax:  [phone]   Nicole Oliver, PT 11/22/2014 3:43 PM Phone:  [phone] Fax: [phone]

## 2014-11-27 ENCOUNTER — Ambulatory Visit: Payer: 59 | Admitting: Physical Therapy

## 2014-11-27 DIAGNOSIS — M25662 Stiffness of left knee, not elsewhere classified: Secondary | ICD-10-CM

## 2014-11-27 DIAGNOSIS — M6281 Muscle weakness (generalized): Secondary | ICD-10-CM

## 2014-11-27 NOTE — Therapy (Signed)
[place], [place], [address] Phone: [phone]   Fax:  [phone]  Physical Therapy Treatment  Patient Details  Name: Nicole Oliver MRN: [id number] Date of Birth: [birth year] Referring Provider:  Gaynelle Arabian, MD  Encounter Date: 11/27/2014      PT End of Session - 11/27/14 1749    Visit Number 14   Number of Visits 21   Date for PT Re-Evaluation 12/20/14   PT Start Time 1500   PT Stop Time 1600   PT Time Calculation (min) 60 min   Activity Tolerance Patient tolerated treatment well;Patient limited by pain   Behavior During Therapy [facility] for tasks assessed/performed      Past Medical History  Diagnosis Date  . Anxiety   . Elevated cholesterol     Past Surgical History  Procedure Laterality Date  . Eye surgery    . Nasal septum surgery    . Oophorectomy      BSO  . Abdominal hysterectomy  2005    TAH BSO endometriosis/leiomyoma    There were no vitals filed for this visit.  Visit Diagnosis:  Joint stiffness of knee, left  Muscle weakness of lower extremity      Subjective Assessment - 11/27/14 1506    Subjective Swa MD.  She needs surgery due to laxiety.  He wants her to work on her strength.   Currently in Pain? Yes   Pain Score 5    Pain Location Knee   Pain Orientation Left   Pain Descriptors / Indicators Aching  stiff   Pain Radiating Towards Whole knee   Aggravating Factors  MD visit, Return to work at the office., , longer walking,    Pain Relieving Factors rest, ice , elevation, soft tissue work                         [facility] Adult PT Treatment/Exercise - 11/27/14 1518    Knee/Hip Exercises: Aerobic   Stationary Bike Able to turn on after 2 minutes, 5 minutes total   Knee/Hip Exercises: Standing   Heel Raises 20 reps  needed cues to lift high ve push body forward to decrease pa   Forward Step Up 2 sets;Hand Hold: 1;Step Height: 2"  cues to use  whole leg   Other Standing Knee Exercises Hip 4 ways WB on left 10x   Knee/Hip Exercises: Seated   Heel Slides Limitations --  green band knee flexion 2 sets 10   Other Seated Knee Exercises cybex knee flexion 2 plates, both 20 reps, 1 plate 1 [id number]. 1 set X 5 reps   Knee/Hip Exercises: Supine   Quad Sets 10 reps;2 sets   Cryotherapy   Number Minutes Cryotherapy 15 Minutes   Cryotherapy Location Knee   Type of Cryotherapy --  Vasopneumatic as previous   Ankle Exercises: Seated   Heel Raises --   Ankle Exercises: Machines for Strengthening   Cybex Leg Press --  2.5 plates 20 reps, 1 leg, 20 LBS 10 reps hard                  PT Short Term Goals - 11/22/14 1342    PT SHORT TERM GOAL #1   Title Patient will have a good understanding of pain management and edema management self care techniques   Status Achieved   PT SHORT TERM GOAL #2   Title Left knee flexion improved to 90 degrees need  for greater ease getting in/out of the car.     Status Achieved   PT SHORT TERM GOAL #3   Title Patient will be able to ambulate > 800 feet needed for grocery shopping.   Status Achieved   PT SHORT TERM GOAL #4   Title Left knee extension improved to 6 degrees needed for greater ease with ambulation for return to work.   Status Achieved           PT Long Term Goals - 11/22/14 1342    PT LONG TERM GOAL #1   Title Patient will be independent in a progressive HEP needed for further ROM and strengthening of left LE.   Time 8   Period Weeks   Status On-going   PT LONG TERM GOAL #2   Title Left knee AROM improved to 2-128 degrees needed for greater ease ascending and descending steps to enter/exit home.   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   Period Weeks   Status On-going   PT LONG TERM GOAL #4   Title Patient will be able to ambulate 1000  feet needed for community ambulation.   Status Achieved   PT LONG TERM GOAL #5   Title FOTO functional outcome score improved to 36% indicating improved function with less pain.   Baseline Improved slightly from 58% to 54%   Time 8   Period Weeks   Status On-going               Plan - 11/27/14 1749    Clinical Impression Statement strengthening focus per patient at MD's request.  No new goals met.  Pain post exercise 6/10 prior to cold.    PT Next Visit Plan more closed chain.  try cable cross pull, more heel lifts.        Problem List Patient Active Problem List   Diagnosis Date Noted  . Finger pain 04/13/2012    HARRIS,KAREN 11/27/2014, 5:52 PM  [facility] [address] [place], [place], [address] Phone: [phone]   Fax:  [phone]    Melvenia Needles, PTA 11/27/2014 5:52 PM Phone: [phone] Fax: [phone]

## 2014-12-04 ENCOUNTER — Ambulatory Visit: Payer: 59 | Admitting: Physical Therapy

## 2014-12-04 DIAGNOSIS — R262 Difficulty in walking, not elsewhere classified: Secondary | ICD-10-CM

## 2014-12-04 DIAGNOSIS — M25662 Stiffness of left knee, not elsewhere classified: Secondary | ICD-10-CM | POA: Diagnosis not present

## 2014-12-04 DIAGNOSIS — M6281 Muscle weakness (generalized): Secondary | ICD-10-CM

## 2014-12-04 NOTE — Patient Instructions (Signed)
Do not walk without brace.

## 2014-12-04 NOTE — Therapy (Signed)
[place], [place], [address] Phone: [phone]   Fax:  [phone]  Physical Therapy Treatment  Patient Details  Name: Nicole Oliver MRN: [id number] Date of Birth: [birth year] Referring Provider:  Gaynelle Arabian, MD  Encounter Date: 12/04/2014      PT End of Session - 12/04/14 1708    Visit Number 15   Number of Visits 21   Date for PT Re-Evaluation 12/20/14   PT Start Time 1503   PT Stop Time 1604   PT Time Calculation (min) 61 min   Activity Tolerance Patient tolerated treatment well;Patient limited by pain   Behavior During Therapy [facility] for tasks assessed/performed      Past Medical History  Diagnosis Date  . Anxiety   . Elevated cholesterol     Past Surgical History  Procedure Laterality Date  . Eye surgery    . Nasal septum surgery    . Oophorectomy      BSO  . Abdominal hysterectomy  2005    TAH BSO endometriosis/leiomyoma    There were no vitals filed for this visit.  Visit Diagnosis:  Joint stiffness of knee, left  Muscle weakness of lower extremity  Difficulty in walking      Subjective Assessment - 12/04/14 1510    Subjective Working and leg swells.  Does her exercises  in the pm, feels her knee is getting stiff   Currently in Pain? No/denies   Pain Score 8   Up to 8/10   Pain Orientation Left   Pain Descriptors / Indicators Sharp;Aching  feels like a nail   Pain Radiating Towards whole knee   Aggravating Factors  walking, moving in bed.   Pain Relieving Factors rest ice, elevation   Multiple Pain Sites No                         [facility] Adult PT Treatment/Exercise - 12/04/14 1505    Knee/Hip Exercises: Machines for Strengthening   Cybex Knee Flexion 2 plates 2 sets both   Knee/Hip Exercises: Standing   Heel Raises 20 reps  increased weight bearing lt   Forward Step Up 1 set;10 reps  4 inches, 2 hands   SLS with Vectors Weight bearing with  good leg Hip abduction 2 sets of 10.   Other Standing Knee Exercises sit to stand 10 reps 2 different heights   Knee/Hip Exercises: Seated   Long Arc Quad --   Other Seated Knee Exercises --   Knee/Hip Exercises: Supine   Quad Sets 10 reps   Short Arc Quad Sets 3 sets  2 LBS, 4 LBS,7.5 LBS   Heel Slides 10 reps   Heel Slides Limitations 118 degrees active   Bridges --   Straight Leg Raises 10 reps  0 LBS, 2 LBS, 10 reps each lifted from ball. guided Rt   Other Supine Knee Exercises --   Knee/Hip Exercises: Sidelying   Clams 10 reps   Other Sidelying Knee Exercises Hip IR with cues   Cryotherapy   Number Minutes Cryotherapy 15 Minutes   Cryotherapy Location Knee   Type of Cryotherapy --  vasopneumatic, moderate pressure 32 degrees.   Manual Therapy   Other Manual Therapy taping for lateral tracking                  PT Short Term Goals - 11/22/14 1342    PT SHORT TERM GOAL #1   Title  Patient will have a good understanding of pain management and edema management self care techniques   Status Achieved   PT SHORT TERM GOAL #2   Title Left knee flexion improved to 90 degrees need for greater ease getting in/out of the car.     Status Achieved   PT SHORT TERM GOAL #3   Title Patient will be able to ambulate > 800 feet needed for grocery shopping.   Status Achieved   PT SHORT TERM GOAL #4   Title Left knee extension improved to 6 degrees needed for greater ease with ambulation for return to work.   Status Achieved           PT Long Term Goals - 11/22/14 1342    PT LONG TERM GOAL #1   Title Patient will be independent in a progressive HEP needed for further ROM and strengthening of left LE.   Time 8   Period Weeks   Status On-going   PT LONG TERM GOAL #2   Title Left knee AROM improved to 2-128 degrees needed for greater ease ascending and descending steps to enter/exit home.   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   Period Weeks   Status On-going   PT LONG TERM GOAL #4   Title Patient will be able to ambulate 1000 feet needed for community ambulation.   Status Achieved   PT LONG TERM GOAL #5   Title FOTO functional outcome score improved to 36% indicating improved function with less pain.   Baseline Improved slightly from 58% to 54%   Time 8   Period Weeks   Status On-going               Plan - 12/04/14 1711    Clinical Impression Statement --  pain3-4/10 after exercise prior to cold        Problem List Patient Active Problem List   Diagnosis Date Noted  . Finger pain 04/13/2012    Lottie Sigman 12/04/2014, 5:13 PM  [facility] [address] [place], [place], [address] Phone: [phone]   Fax:  [phone]  Melvenia Needles, PTA 12/04/2014 5:13 PM Phone: [phone] Fax: [phone]

## 2014-12-06 ENCOUNTER — Ambulatory Visit: Payer: 59 | Admitting: Physical Therapy

## 2014-12-06 DIAGNOSIS — M6281 Muscle weakness (generalized): Secondary | ICD-10-CM

## 2014-12-06 DIAGNOSIS — M25662 Stiffness of left knee, not elsewhere classified: Secondary | ICD-10-CM

## 2014-12-06 NOTE — Therapy (Signed)
[place], [place], [address] Phone: [phone]   Fax:  [phone]  Physical Therapy Treatment  Patient Details  Name: Nicole Oliver MRN: [id number] Date of Birth: 05/21/1966 Referring Provider:  Gaynelle Arabian, MD  Encounter Date: 12/06/2014      PT End of Session - 12/06/14 1729    Visit Number 16   Number of Visits 21   Date for PT Re-Evaluation 12/20/14   PT Start Time 1502   PT Stop Time 1602   PT Time Calculation (min) 60 min   Activity Tolerance Patient tolerated treatment well   Behavior During Therapy [facility] for tasks assessed/performed      Past Medical History  Diagnosis Date  . Anxiety   . Elevated cholesterol     Past Surgical History  Procedure Laterality Date  . Eye surgery    . Nasal septum surgery    . Oophorectomy      BSO  . Abdominal hysterectomy  2005    TAH BSO endometriosis/leiomyoma    There were no vitals filed for this visit.  Visit Diagnosis:  Muscle weakness of lower extremity  Joint stiffness of knee, left      Subjective Assessment - 12/06/14 1512    Subjective --  has pain going down ramp                         [facility] Adult PT Treatment/Exercise - 12/06/14 1510    Knee/Hip Exercises: Aerobic   Stationary Bike Nustep   8 minutes,   Knee/Hip Exercises: Machines for Strengthening   Cybex Knee Flexion 2 plates 2 sets both  2 flex Lt eccentric.     Knee/Hip Exercises: Standing   Heel Raises 20 reps  both   Knee Flexion 3 sets;10 reps  2 sets with 3 LBS   Forward Step Up 1 set;Hand Hold: 2;Step Height: 6"   Knee/Hip Exercises: Prone   Hamstring Curl 3 sets  3 LBS.  Pad At knee for comfort   Cryotherapy   Number Minutes Cryotherapy 15 Minutes   Cryotherapy Location Knee   Type of Cryotherapy --  vasopneumatic, 32 degrees, moderate pressure                  PT Short Term Goals - 11/22/14 1342    PT SHORT TERM  GOAL #1   Title Patient will have a good understanding of pain management and edema management self care techniques   Status Achieved   PT SHORT TERM GOAL #2   Title Left knee flexion improved to 90 degrees need for greater ease getting in/out of the car.     Status Achieved   PT SHORT TERM GOAL #3   Title Patient will be able to ambulate > 800 feet needed for grocery shopping.   Status Achieved   PT SHORT TERM GOAL #4   Title Left knee extension improved to 6 degrees needed for greater ease with ambulation for return to work.   Status Achieved           PT Long Term Goals - 11/22/14 1342    PT LONG TERM GOAL #1   Title Patient will be independent in a progressive HEP needed for further ROM and strengthening of left LE.   Time 8   Period Weeks   Status On-going   PT LONG TERM GOAL #2   Title Left knee AROM improved to 2-128 degrees  needed for greater ease ascending and descending steps to enter/exit home.   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   Period Weeks   Status On-going   PT LONG TERM GOAL #4   Title Patient will be able to ambulate 1000 feet needed for community ambulation.   Status Achieved   PT LONG TERM GOAL #5   Title FOTO functional outcome score improved to 36% indicating improved function with less pain.   Baseline Improved slightly from 58% to 54%   Time 8   Period Weeks   Status On-going               Plan - 12/06/14 1730    Clinical Impression Statement Patient focus on strengthening today , no new goals met.  Focus Hamstrings     Pain post exercise prior to cold was unchanged.    Problem List Patient Active Problem List   Diagnosis Date Noted  . Finger pain 04/13/2012    Xavyer Steenson 12/06/2014, 5:33 PM  [facility] [address] [place], [place], [address] Phone: [phone]   Fax:  [phone]  Melvenia Needles, PTA 12/06/2014 5:33 PM Phone: [phone] Fax: [phone]

## 2014-12-11 ENCOUNTER — Ambulatory Visit: Payer: 59 | Admitting: Physical Therapy

## 2014-12-11 DIAGNOSIS — S83512A Sprain of anterior cruciate ligament of left knee, initial encounter: Secondary | ICD-10-CM

## 2014-12-11 DIAGNOSIS — M6281 Muscle weakness (generalized): Secondary | ICD-10-CM

## 2014-12-11 DIAGNOSIS — M25662 Stiffness of left knee, not elsewhere classified: Secondary | ICD-10-CM | POA: Diagnosis not present

## 2014-12-11 DIAGNOSIS — R262 Difficulty in walking, not elsewhere classified: Secondary | ICD-10-CM

## 2014-12-11 NOTE — Patient Instructions (Signed)
Discussed areas of focus of HEP over the next 2 weeks until her ACL repair including bike for ROM, strengthening as tolerated by pain.   Also discussed typical post ACL repair rehab progression

## 2014-12-11 NOTE — Therapy (Signed)
[place], [place], [address] Phone: [phone]   Fax:  [phone]  Physical Therapy Treatment/Discharge Summary  Patient Details  Name: Nicole Oliver MRN: [id number] Date of Birth: Jan 28, 1966 Referring Provider:  Gaynelle Arabian, MD  Encounter Date: 12/11/2014      PT End of Session - 12/11/14 1734    Visit Number 17   Number of Visits 21   Date for PT Re-Evaluation 12/20/14   Authorization Type [facility]   PT Start Time 1545   PT Stop Time 1633   PT Time Calculation (min) 48 min   Activity Tolerance Patient tolerated treatment well      Past Medical History  Diagnosis Date  . Anxiety   . Elevated cholesterol     Past Surgical History  Procedure Laterality Date  . Eye surgery    . Nasal septum surgery    . Oophorectomy      BSO  . Abdominal hysterectomy  2005    TAH BSO endometriosis/leiomyoma    There were no vitals filed for this visit.  Visit Diagnosis:  Muscle weakness of lower extremity  Joint stiffness of knee, left  Difficulty in walking  ACL tear, left, initial encounter      Subjective Assessment - 12/11/14 1549    Subjective Patient reports continued sharp pains that cause her leg to give-way, therefore she continues to use her crutches.  She has returned to work.  She purchased a recumbent bike from [employer] and is waiting for it to be delivered.  Patient states she is to have surgery in mid July.     Pain Score 3    Pain Orientation Left   Pain Descriptors / Indicators Aching;Throbbing   Pain Type Acute pain   Pain Onset More than a month ago   Pain Frequency Constant   Aggravating Factors  walking a lot   Pain Relieving Factors rest, elevation, ice            [facility] PT Assessment - 12/11/14 1604    Observation/Other Assessments   Focus on Therapeutic Outcomes (FOTO)  57%   AROM   Left Knee Extension 2   Left Knee Flexion 120   Strength   Left Hip  ABduction 5/5   Left Knee Flexion 4-/5   Left Knee Extension 3+/5                     [facility] Adult PT Treatment/Exercise - 12/11/14 1603    Knee/Hip Exercises: Aerobic   Stationary Bike Nu-Step L4 15 min   Knee/Hip Exercises: Standing   Forward Step Up Left;1 set;20 reps;Hand Hold: 2;Step Height: 6"   Knee/Hip Exercises: Supine   Quad Sets 10 reps   Straight Leg Raises 10 reps                PT Education - 12/11/14 1733    Education provided Yes   Education Details HEP for next 2 weeks with focus on ROM and strengthening within pain tolerance   Person(s) Educated Patient   Methods Explanation;Demonstration   Comprehension Verbalized understanding          PT Short Term Goals - 12/11/14 1555    PT SHORT TERM GOAL #1   Title Patient will have a good understanding of pain management and edema management self care techniques   Status Achieved   PT SHORT TERM GOAL #2   Title Left knee flexion improved to 90 degrees  need for greater ease getting in/out of the car.     Status Achieved   PT SHORT TERM GOAL #3   Title Patient will be able to ambulate > 800 feet needed for grocery shopping.   Status Achieved   PT SHORT TERM GOAL #4   Title Left knee extension improved to 6 degrees needed for greater ease with ambulation for return to work.   Status Achieved           PT Long Term Goals - 12/11/14 1555    PT LONG TERM GOAL #1   Title Patient will be independent in a progressive HEP needed for further ROM and strengthening of left LE.   Status Achieved   PT LONG TERM GOAL #2   Title Left knee AROM improved to 2-128 degrees needed for greater ease ascending and descending steps to enter/exit home.   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   Status Partially Met   PT LONG TERM GOAL #4   Title Patient will be able to  ambulate 1000 feet needed for community ambulation.   Status Achieved   PT LONG TERM GOAL #5   Title FOTO functional outcome score improved to 36% indicating improved function with less pain.   Status Partially Met               Plan - 12/11/14 1734    Clinical Impression Statement The patient states she should be having her ACL repair in the next 2-3 weeks.  She reports she has ordered a recumbent bike for ROM at home and she feels she is now able to do a basic strengthening HEP including step up exercises.  She continues to have sharp shooting pains with walking and the feeling of instability in her knee therefore she continues to use her crutches.  She feels she is ready to be discharged from PT at this time to an independent program, knowing that she will need post op rehab later in July.  Partial goals met.  Patient does have more limited knee AROM today than usual secondary to end of the day swelling after being at work.  Discharge from PT at this time.        Problem List Patient Active Problem List   Diagnosis Date Noted  . Finger pain 04/13/2012    Nicole Oliver 12/11/2014, 5:40 PM  [facility] [address] [place], [place], [address] Phone: [phone]   Fax:  [phone]   PHYSICAL THERAPY DISCHARGE SUMMARY  Visits from Start of Care: 17  Current functional level related to goals / functional outcomes: Majority of goals met.   Remaining deficits: Patient continues to have pain and instability and will have surgery in 2-3 weeks for ACL repair.  Patient is independent in HEP for ROM/strengthening with current level but will need post-op rehab with status change.    Education / Equipment: HEP Plan: Patient agrees to discharge.  Patient goals were partially met. Patient is being discharged due to meeting the stated rehab goals.  ?????   Ruben Im, PT 12/11/2014 5:42 PM Phone: [phone] Fax:  [phone]

## 2014-12-13 ENCOUNTER — Ambulatory Visit: Payer: 59 | Admitting: Physical Therapy

## 2014-12-18 ENCOUNTER — Ambulatory Visit: Payer: 59 | Admitting: Physical Therapy

## 2014-12-20 ENCOUNTER — Ambulatory Visit: Payer: 59 | Admitting: Physical Therapy

## 2014-12-21 DIAGNOSIS — S83519A Sprain of anterior cruciate ligament of unspecified knee, initial encounter: Secondary | ICD-10-CM

## 2014-12-21 HISTORY — DX: Sprain of anterior cruciate ligament of unspecified knee, initial encounter: S83.519A

## 2014-12-27 ENCOUNTER — Ambulatory Visit: Payer: 59 | Admitting: Physical Therapy

## 2015-01-04 NOTE — H&P (Addendum)
Nicole Fears, MD   Biagio Borg, PA-C [address], [place], [place]  [address]                             [phone]   ORTHOPAEDIC HISTORY & PHYSICAL  Nicole Oliver MRN:  [id number] DOB/SEX:  1965/12/17/female  CHIEF COMPLAINT:  Painful left knee  HISTORY: Nicole Oliver is three  months post injury to her left knee.  An MRI scan notes that she has a complete tear of the anterior cruciate ligament associated with bone contusions of both tibial plateaus.  She had a moderate hemarthrosis at the time of the MRI scan.  She had some posterolateral soft tissue edema without disruption of the lateral collateral ligament complex.  Both menisci and collateral ligaments were intact.  She has been using two crutches and a long hinged brace and going to therapy.  She does work in Programmer, applications at [employer] and has been able to work from home without any problems.  She still has the sensation of her knee giving way with certainly activities and with occasional "sharp pain.    PAST MEDICAL HISTORY: Patient Active Problem List   Diagnosis Date Noted  . Finger pain 04/13/2012   Past Medical History  Diagnosis Date  . Anxiety   . ACL tear 12/2014    left  . Dental crown present   . Hyperlipidemia    Past Surgical History  Procedure Laterality Date  . Nasal reconstruction with septal repair    . Total abdominal hysterectomy w/ bilateral salpingoophorectomy  06/25/2003  . Strabismus surgery       MEDICATIONS:   Prescriptions prior to admission  Medication Sig Dispense Refill Last Dose  . ALPRAZolam (XANAX) 0.25 MG tablet Take 0.25 mg by mouth at bedtime.    01/09/2015 at Unknown time  . cholecalciferol (VITAMIN D) 1000 UNITS tablet Take 4,000 Units by mouth daily.   Past Week at Unknown time  . estradiol (VIVELLE-DOT) 0.075 MG/24HR Place 1 patch onto the skin 2 (two) times a week. 8 patch 12 Past Week at Unknown time  . fenofibrate 160 MG tablet Take 160 mg by mouth daily.   01/09/2015  at Unknown time  . ibuprofen (ADVIL,MOTRIN) 200 MG tablet Take 600 mg by mouth every 6 (six) hours as needed.   01/09/2015 at Unknown time    ALLERGIES:   Allergies  Allergen Reactions  . Sulfa Antibiotics Nausea Only    REVIEW OF SYSTEMS:  A comprehensive review of systems was negative.   FAMILY HISTORY:   Family History  Problem Relation Age of Onset  . Breast cancer Mother 31  . Ovarian cancer Mother   . Cancer Mother     Pancreatic  . Hypertension Father   . Hyperlipidemia Father   . Breast cancer Sister 46  . Breast cancer Maternal Aunt 38  . Hypertension Maternal Grandmother   . Breast cancer Other 26    SOCIAL HISTORY:   History  Substance Use Topics  . Smoking status: Former Smoker    Quit date: 06/21/2002  . Smokeless tobacco: Never Used  . Alcohol Use: No      EXAMINATION: Vital signs in last 24 hours: Temp:  [97.8 F (36.6 C)] 97.8 F (36.6 C) (07/21 0629) Pulse Rate:  [71-83] 73 (07/21 0700) Resp:  [13-25] 13 (07/21 0700) BP: (96-109)/(56-62) 96/56 mmHg (07/21 0700) SpO2:  [97 %-100 %] 100 % (07/21  0700) Weight:  [75.751 kg (167 lb)] 75.751 kg (167 lb) (07/21 0629)  Head is normocephalic.   Eyes:  Pupils equal, round and reactive to light and accommodation.  Extraocular intact. ENT: Ears, nose, and throat were benign.   Neck: supple, no bruits were noted.   Chest: good expansion.   Lungs: essentially clear.   Cardiac: regular rhythm and rate, normal S1, S2.  No murmurs appreciated. Pulses :  2+ bilateral and symmetric in lower extremities. Abdomen is scaphoid, soft, nontender, no masses palpable, normal bowel sounds                  present. CNS:  He is oriented x3 and cranial nerves II-XII grossly intact. Breast, rectal, and genital exams: not performed and not indicated for an orthopedic evaluation. Musculoskeletal: she has range of motion 0-120 degrees.  Trace effusion.  She is tender over the pes and the proximal medial tibial plateau.  She  does have a positive drawer.  Positive Lachman.  Some tenderness over the medial joint line.  She is neurovascularly intact distally.    Imaging Review The MRI scan today has been reviewed.  She has intact menisci.  She has an acute, complete tear of the anterior cruciate ligament with surrounding hemorrhage.  The cartilage is intact.  Moderate complex hemarthrosis.  Baker's cyst is fluid filled, which is small.  There is some fluid within the popliteal hiatus.  There are bone contusions of both tibia plateaus posteriorly.    ASSESSMENT: left complete ACL tear  Past Medical History  Diagnosis Date  . Anxiety   . ACL tear 12/2014    left  . Dental crown present   . Hyperlipidemia     PLAN: Plan for left ACL reconstruction using allograft patellar tendon  The procedure,  risks, and benefits of surgery were presented and reviewed. The risks including but not limited to infection, blood clots, vascular and nerve injury, stiffness,  among others were discussed. The patient acknowledged the explanation, agreed to proceed.   Mike Craze Pelzer, [place] [phone]  01/10/2015 7:29 AM

## 2015-01-07 ENCOUNTER — Encounter (HOSPITAL_BASED_OUTPATIENT_CLINIC_OR_DEPARTMENT_OTHER): Payer: Self-pay | Admitting: *Deleted

## 2015-01-10 ENCOUNTER — Encounter (HOSPITAL_BASED_OUTPATIENT_CLINIC_OR_DEPARTMENT_OTHER)
Admission: RE | Disposition: A | Discharge status: Home / Self Care | Payer: Self-pay | Source: Ambulatory Visit | Attending: Orthopaedic Surgery

## 2015-01-10 ENCOUNTER — Ambulatory Visit (HOSPITAL_BASED_OUTPATIENT_CLINIC_OR_DEPARTMENT_OTHER)
Admission: RE | Admit: 2015-01-10 | Discharge: 2015-01-10 | Disposition: A | Discharge status: Home / Self Care | Payer: 59 | Source: Ambulatory Visit | Attending: Orthopaedic Surgery | Admitting: Orthopaedic Surgery

## 2015-01-10 ENCOUNTER — Ambulatory Visit (HOSPITAL_BASED_OUTPATIENT_CLINIC_OR_DEPARTMENT_OTHER): Payer: 59 | Admitting: Anesthesiology

## 2015-01-10 ENCOUNTER — Encounter (HOSPITAL_BASED_OUTPATIENT_CLINIC_OR_DEPARTMENT_OTHER): Payer: Self-pay | Admitting: *Deleted

## 2015-01-10 DIAGNOSIS — Z87891 Personal history of nicotine dependence: Secondary | ICD-10-CM | POA: Insufficient documentation

## 2015-01-10 DIAGNOSIS — Y929 Unspecified place or not applicable: Secondary | ICD-10-CM | POA: Insufficient documentation

## 2015-01-10 DIAGNOSIS — Z882 Allergy status to sulfonamides status: Secondary | ICD-10-CM | POA: Insufficient documentation

## 2015-01-10 DIAGNOSIS — M25562 Pain in left knee: Secondary | ICD-10-CM | POA: Diagnosis present

## 2015-01-10 DIAGNOSIS — E785 Hyperlipidemia, unspecified: Secondary | ICD-10-CM | POA: Insufficient documentation

## 2015-01-10 DIAGNOSIS — S83519A Sprain of anterior cruciate ligament of unspecified knee, initial encounter: Secondary | ICD-10-CM | POA: Diagnosis present

## 2015-01-10 DIAGNOSIS — Z79899 Other long term (current) drug therapy: Secondary | ICD-10-CM | POA: Diagnosis not present

## 2015-01-10 DIAGNOSIS — S83512A Sprain of anterior cruciate ligament of left knee, initial encounter: Secondary | ICD-10-CM | POA: Diagnosis not present

## 2015-01-10 DIAGNOSIS — X58XXXA Exposure to other specified factors, initial encounter: Secondary | ICD-10-CM | POA: Diagnosis not present

## 2015-01-10 DIAGNOSIS — F419 Anxiety disorder, unspecified: Secondary | ICD-10-CM | POA: Diagnosis not present

## 2015-01-10 HISTORY — DX: Sprain of anterior cruciate ligament of unspecified knee, initial encounter: S83.519A

## 2015-01-10 HISTORY — DX: Dental restoration status: Z98.811

## 2015-01-10 HISTORY — PX: KNEE ARTHROSCOPY WITH ANTERIOR CRUCIATE LIGAMENT (ACL) REPAIR: SHX5644

## 2015-01-10 HISTORY — DX: Hyperlipidemia, unspecified: E78.5

## 2015-01-10 LAB — POCT HEMOGLOBIN-HEMACUE: HEMOGLOBIN: 12.9 g/dL (ref 12.0–15.0)

## 2015-01-10 SURGERY — KNEE ARTHROSCOPY WITH ANTERIOR CRUCIATE LIGAMENT (ACL) REPAIR
Anesthesia: General | Site: Knee | Laterality: Left

## 2015-01-10 MED ORDER — BUPIVACAINE-EPINEPHRINE (PF) 0.5% -1:200000 IJ SOLN
INTRAMUSCULAR | Status: AC
  Filled 2015-01-10: qty 30

## 2015-01-10 MED ORDER — MIDAZOLAM HCL 5 MG/5ML IJ SOLN
INTRAMUSCULAR | Status: DC | PRN
  Administered 2015-01-10: 2 mg via INTRAVENOUS

## 2015-01-10 MED ORDER — MIDAZOLAM HCL 2 MG/2ML IJ SOLN
1.0000 mg | INTRAMUSCULAR | Status: DC | PRN
  Administered 2015-01-10: 2 mg via INTRAVENOUS

## 2015-01-10 MED ORDER — MIDAZOLAM HCL 2 MG/2ML IJ SOLN
INTRAMUSCULAR | Status: AC
  Filled 2015-01-10: qty 2

## 2015-01-10 MED ORDER — FENTANYL CITRATE (PF) 100 MCG/2ML IJ SOLN
INTRAMUSCULAR | Status: DC | PRN
  Administered 2015-01-10: 25 ug via INTRAVENOUS
  Administered 2015-01-10: 50 ug via INTRAVENOUS
  Administered 2015-01-10: 25 ug via INTRAVENOUS

## 2015-01-10 MED ORDER — ACETAMINOPHEN 10 MG/ML IV SOLN
INTRAVENOUS | Status: AC
  Filled 2015-01-10: qty 100

## 2015-01-10 MED ORDER — HYDROMORPHONE HCL 1 MG/ML IJ SOLN
0.2500 mg | INTRAMUSCULAR | Status: DC | PRN
  Administered 2015-01-10 (×2): 0.5 mg via INTRAVENOUS

## 2015-01-10 MED ORDER — KETOROLAC TROMETHAMINE 30 MG/ML IJ SOLN
INTRAMUSCULAR | Status: AC
  Filled 2015-01-10: qty 1

## 2015-01-10 MED ORDER — CEFAZOLIN SODIUM-DEXTROSE 2-3 GM-% IV SOLR
2.0000 g | INTRAVENOUS | Status: AC
  Administered 2015-01-10: 2 g via INTRAVENOUS

## 2015-01-10 MED ORDER — SCOPOLAMINE 1 MG/3DAYS TD PT72: 1.0000 | MEDICATED_PATCH | Freq: Once | TRANSDERMAL | Status: DC | PRN

## 2015-01-10 MED ORDER — PROMETHAZINE HCL 25 MG/ML IJ SOLN: 6.2500 mg | INTRAMUSCULAR | Status: DC | PRN

## 2015-01-10 MED ORDER — KETOROLAC TROMETHAMINE 30 MG/ML IJ SOLN
30.0000 mg | Freq: Once | INTRAMUSCULAR | Status: AC | PRN
  Administered 2015-01-10: 30 mg via INTRAVENOUS

## 2015-01-10 MED ORDER — LIDOCAINE HCL (CARDIAC) 20 MG/ML IV SOLN
INTRAVENOUS | Status: DC | PRN
  Administered 2015-01-10: 30 mg via INTRAVENOUS

## 2015-01-10 MED ORDER — DEXAMETHASONE SODIUM PHOSPHATE 4 MG/ML IJ SOLN
INTRAMUSCULAR | Status: DC | PRN
  Administered 2015-01-10: 10 mg via INTRAVENOUS

## 2015-01-10 MED ORDER — CEFAZOLIN SODIUM-DEXTROSE 2-3 GM-% IV SOLR
INTRAVENOUS | Status: AC
  Filled 2015-01-10: qty 50

## 2015-01-10 MED ORDER — ROPIVACAINE HCL 5 MG/ML IJ SOLN
INTRAMUSCULAR | Status: DC | PRN
  Administered 2015-01-10: 25 mL via PERINEURAL

## 2015-01-10 MED ORDER — LACTATED RINGERS IV SOLN
INTRAVENOUS | Status: DC
  Administered 2015-01-10 (×2): via INTRAVENOUS

## 2015-01-10 MED ORDER — OXYCODONE HCL 5 MG PO TABS
5.0000 mg | ORAL_TABLET | Freq: Once | ORAL | Status: AC
Start: 2015-01-10 — End: 2015-01-10
  Administered 2015-01-10: 5 mg via ORAL

## 2015-01-10 MED ORDER — ONDANSETRON HCL 4 MG/2ML IJ SOLN
INTRAMUSCULAR | Status: DC | PRN
  Administered 2015-01-10: 4 mg via INTRAVENOUS

## 2015-01-10 MED ORDER — IBUPROFEN 200 MG PO TABS: 600.0000 mg | ORAL_TABLET | Freq: Three times a day (TID) | ORAL | Status: DC | PRN

## 2015-01-10 MED ORDER — FENTANYL CITRATE (PF) 100 MCG/2ML IJ SOLN
INTRAMUSCULAR | Status: AC
  Filled 2015-01-10: qty 6

## 2015-01-10 MED ORDER — LIDOCAINE-EPINEPHRINE (PF) 1.5 %-1:200000 IJ SOLN
INTRAMUSCULAR | Status: DC | PRN
Start: 2015-01-10 — End: 2015-01-10
  Administered 2015-01-10: 10 mL via PERINEURAL

## 2015-01-10 MED ORDER — HYDROMORPHONE HCL 1 MG/ML IJ SOLN
INTRAMUSCULAR | Status: AC
  Filled 2015-01-10: qty 1

## 2015-01-10 MED ORDER — OXYCODONE HCL 5 MG PO TABS
ORAL_TABLET | ORAL | Status: AC
  Filled 2015-01-10: qty 1

## 2015-01-10 MED ORDER — GLYCOPYRROLATE 0.2 MG/ML IJ SOLN: 0.2000 mg | Freq: Once | INTRAMUSCULAR | Status: DC | PRN

## 2015-01-10 MED ORDER — FENTANYL CITRATE (PF) 100 MCG/2ML IJ SOLN
50.0000 ug | INTRAMUSCULAR | Status: DC | PRN
  Administered 2015-01-10: 100 ug via INTRAVENOUS

## 2015-01-10 MED ORDER — PROPOFOL 500 MG/50ML IV EMUL
INTRAVENOUS | Status: AC
  Filled 2015-01-10: qty 50

## 2015-01-10 MED ORDER — ACETAMINOPHEN 10 MG/ML IV SOLN
1000.0000 mg | Freq: Once | INTRAVENOUS | Status: AC
Start: 2015-01-10 — End: 2015-01-10
  Administered 2015-01-10: 1000 mg via INTRAVENOUS

## 2015-01-10 MED ORDER — CHLORHEXIDINE GLUCONATE 4 % EX LIQD: 60.0000 mL | Freq: Once | CUTANEOUS | Status: DC

## 2015-01-10 MED ORDER — FENTANYL CITRATE (PF) 100 MCG/2ML IJ SOLN
INTRAMUSCULAR | Status: AC
  Filled 2015-01-10: qty 2

## 2015-01-10 MED ORDER — SODIUM CHLORIDE 0.9 % IV SOLN: INTRAVENOUS | Status: DC

## 2015-01-10 MED ORDER — PROPOFOL 10 MG/ML IV BOLUS
INTRAVENOUS | Status: DC | PRN
  Administered 2015-01-10: 200 mg via INTRAVENOUS

## 2015-01-10 MED ORDER — PROPOFOL 10 MG/ML IV BOLUS
INTRAVENOUS | Status: AC
  Filled 2015-01-10: qty 80

## 2015-01-10 MED ORDER — BUPIVACAINE-EPINEPHRINE 0.5% -1:200000 IJ SOLN
INTRAMUSCULAR | Status: DC | PRN
  Administered 2015-01-10: 14 mL

## 2015-01-10 SURGICAL SUPPLY — 74 items
BANDAGE ELASTIC 6 VELCRO ST LF (GAUZE/BANDAGES/DRESSINGS) IMPLANT
BANDAGE ESMARK 6X9 LF (GAUZE/BANDAGES/DRESSINGS) IMPLANT
BENZOIN TINCTURE PRP APPL 2/3 (GAUZE/BANDAGES/DRESSINGS) ×2 IMPLANT
BIT DRILL 67X1.5XWRPS STRL (BIT) IMPLANT
BIT DRL 67X1.5XWRPS STRL (BIT)
BLADE 4.2CUDA (BLADE) ×2 IMPLANT
BLADE AVERAGE 25X9 (BLADE) IMPLANT
BLADE CUDA 5.5 (BLADE) IMPLANT
BLADE GREAT WHITE 4.2 (BLADE) IMPLANT
BLADE OSCIL/SAGITTAL W/10 ST (BLADE) IMPLANT
BLADE SURG 15 STRL LF DISP TIS (BLADE) ×1 IMPLANT
BLADE SURG 15 STRL SS (BLADE) ×1
BNDG ESMARK 6X9 LF (GAUZE/BANDAGES/DRESSINGS)
BNDG GAUZE ELAST 4 BULKY (GAUZE/BANDAGES/DRESSINGS) ×2 IMPLANT
BUR EGG 3PK/BX (BURR) ×2 IMPLANT
BUR PEAR (BURR) IMPLANT
BUR VERTEX HOODED 4.5 (BURR) ×2 IMPLANT
COVER BACK TABLE 60X90IN (DRAPES) ×2 IMPLANT
DRAPE ARTHROSCOPY W/POUCH 114 (DRAPES) ×2 IMPLANT
DRAPE INCISE IOBAN 66X45 STRL (DRAPES) ×2 IMPLANT
DRILL BIT WIRE PASS (BIT)
DRSG EMULSION OIL 3X3 NADH (GAUZE/BANDAGES/DRESSINGS) ×4 IMPLANT
DURAPREP 26ML APPLICATOR (WOUND CARE) ×2 IMPLANT
ELECT REM PT RETURN 9FT ADLT (ELECTROSURGICAL)
ELECTRODE REM PT RTRN 9FT ADLT (ELECTROSURGICAL) IMPLANT
GAUZE SPONGE 4X4 12PLY STRL (GAUZE/BANDAGES/DRESSINGS) ×2 IMPLANT
GLOVE BIO SURGEON STRL SZ 6.5 (GLOVE) ×4 IMPLANT
GLOVE BIOGEL PI IND STRL 8 (GLOVE) ×1 IMPLANT
GLOVE BIOGEL PI IND STRL 8.5 (GLOVE) ×1 IMPLANT
GLOVE BIOGEL PI INDICATOR 8 (GLOVE) ×1
GLOVE BIOGEL PI INDICATOR 8.5 (GLOVE) ×1
GLOVE ECLIPSE 8.0 STRL XLNG CF (GLOVE) ×4 IMPLANT
GOWN STRL REUS W/ TWL LRG LVL3 (GOWN DISPOSABLE) ×2 IMPLANT
GOWN STRL REUS W/ TWL XL LVL3 (GOWN DISPOSABLE) ×1 IMPLANT
GOWN STRL REUS W/TWL LRG LVL3 (GOWN DISPOSABLE) ×2
GOWN STRL REUS W/TWL XL LVL3 (GOWN DISPOSABLE) ×1
HOLDER KNEE FOAM BLUE (MISCELLANEOUS) ×2 IMPLANT
IMMOBILIZER KNEE 22 UNIV (SOFTGOODS) ×2 IMPLANT
IMMOBILIZER KNEE 24 THIGH 36 (MISCELLANEOUS) IMPLANT
IMMOBILIZER KNEE 24 UNIV (MISCELLANEOUS)
IV NS IRRIG 3000ML ARTHROMATIC (IV SOLUTION) ×4 IMPLANT
KIT TRANSTIBIAL (DISPOSABLE) ×2 IMPLANT
KNEE WRAP E Z 3 GEL PACK (MISCELLANEOUS) IMPLANT
KNIFE GRAFT ACL 10MM 5952 (MISCELLANEOUS) IMPLANT
MANIFOLD NEPTUNE II (INSTRUMENTS) IMPLANT
PACK ARTHROSCOPY DSU (CUSTOM PROCEDURE TRAY) ×2 IMPLANT
PACK BASIN DAY SURGERY FS (CUSTOM PROCEDURE TRAY) ×2 IMPLANT
PASSER SUT SWANSON 36MM LOOP (INSTRUMENTS) IMPLANT
PATELLA LIGAMENT BISECTED FR (Tissue) ×2 IMPLANT
SCREW INTERFERENCE 8X25MM (Screw) ×2 IMPLANT
SCREW SHEATHED INTERF 8X25MM (Screw) ×2 IMPLANT
SCREW SHEATHED INTERF 9X25MM (Screw) ×2 IMPLANT
SET ARTHROSCOPY TUBING (MISCELLANEOUS) ×1
SET ARTHROSCOPY TUBING LN (MISCELLANEOUS) ×1 IMPLANT
SPONGE LAP 4X18 X RAY DECT (DISPOSABLE) ×2 IMPLANT
STAPLER VISISTAT 35W (STAPLE) IMPLANT
STRIP CLOSURE SKIN 1/2X4 (GAUZE/BANDAGES/DRESSINGS) IMPLANT
SUCTION FRAZIER TIP 10 FR DISP (SUCTIONS) ×2 IMPLANT
SUT BONE WAX W31G (SUTURE) ×2 IMPLANT
SUT ETHILON 4 0 PS 2 18 (SUTURE) ×2 IMPLANT
SUT FIBERWIRE #2 38 T-5 BLUE (SUTURE) ×8
SUT PROLENE 3 0 PS 2 (SUTURE) IMPLANT
SUT VIC AB 0 CT1 27 (SUTURE) ×1
SUT VIC AB 0 CT1 27XBRD ANBCTR (SUTURE) ×1 IMPLANT
SUT VIC AB 1 CT1 27 (SUTURE)
SUT VIC AB 1 CT1 27XBRD ANBCTR (SUTURE) IMPLANT
SUT VIC AB 2-0 SH 27 (SUTURE)
SUT VIC AB 2-0 SH 27XBRD (SUTURE) IMPLANT
SUT VIC AB 3-0 FS2 27 (SUTURE) ×2 IMPLANT
SUTURE FIBERWR #2 38 T-5 BLUE (SUTURE) ×4 IMPLANT
TOWEL OR 17X24 6PK STRL BLUE (TOWEL DISPOSABLE) ×2 IMPLANT
WAND 3.0 CAPSURE 30 DEG W/CORD (SURGICAL WAND) IMPLANT
WAND STAR VAC 90 (SURGICAL WAND) IMPLANT
WATER STERILE IRR 1000ML POUR (IV SOLUTION) ×2 IMPLANT

## 2015-01-10 NOTE — Anesthesia Preprocedure Evaluation (Addendum)
Anesthesia Evaluation  Patient identified by MRN, date of birth, ID band Patient awake    Reviewed: Allergy & Precautions, NPO status , Patient's Chart, lab work & pertinent test results  Airway Mallampati: II  TM Distance: >3 FB Neck ROM: Full    Dental no notable dental hx.    Pulmonary neg pulmonary ROS, former smoker,  breath sounds clear to auscultation  Pulmonary exam normal       Cardiovascular negative cardio ROS Normal cardiovascular examRhythm:Regular Rate:Normal     Neuro/Psych Anxiety negative neurological ROS     GI/Hepatic negative GI ROS, Neg liver ROS,   Endo/Other  negative endocrine ROS  Renal/GU negative Renal ROS  negative genitourinary   Musculoskeletal negative musculoskeletal ROS (+)   Abdominal   Peds negative pediatric ROS (+)  Hematology negative hematology ROS (+)   Anesthesia Other Findings   Reproductive/Obstetrics negative OB ROS                            Anesthesia Physical Anesthesia Plan  ASA: II  Anesthesia Plan: General   Post-op Pain Management: GA combined w/ Regional for post-op pain   Induction: Intravenous  Airway Management Planned: LMA and Oral ETT  Additional Equipment:   Intra-op Plan:   Post-operative Plan: Extubation in OR  Informed Consent: I have reviewed the patients History and Physical, chart, labs and discussed the procedure including the risks, benefits and alternatives for the proposed anesthesia with the patient or authorized representative who has indicated his/her understanding and acceptance.   Dental advisory given  Plan Discussed with: CRNA and Surgeon  Anesthesia Plan Comments:         Anesthesia Quick Evaluation

## 2015-01-10 NOTE — Anesthesia Procedure Notes (Addendum)
Anesthesia Regional Block:  Femoral nerve block  Pre-Anesthetic Checklist: ,, timeout performed, Correct Patient, Correct Site, Correct Laterality, Correct Procedure, Correct Position, site marked, Risks and benefits discussed,  Surgical consent,  Pre-op evaluation,  At surgeon's request and post-op pain management  Laterality: Left  Prep: chloraprep       Needles:  Injection technique: Single-shot  Needle Type: Echogenic Stimulator Needle     Needle Length: 9cm 9 cm Needle Gauge: 21 and 21 G    Additional Needles:  Procedures: ultrasound guided (picture in chart) Femoral nerve block Narrative:  Injection made incrementally with aspirations every 5 mL.  Performed by: Personally   Additional Notes: Patient tolerated the procedure well without complications   Procedure Name: LMA Insertion Date/Time: 01/10/2015 7:39 AM Performed by: Toula Moos L Pre-anesthesia Checklist: Patient identified, Emergency Drugs available, Suction available, Patient being monitored and Timeout performed Patient Re-evaluated:Patient Re-evaluated prior to inductionOxygen Delivery Method: Circle System Utilized Preoxygenation: Pre-oxygenation with 100% oxygen Intubation Type: IV induction Ventilation: Mask ventilation without difficulty LMA: LMA inserted LMA Size: 4.0 Number of attempts: 1 Airway Equipment and Method: Bite block Placement Confirmation: positive ETCO2 Tube secured with: Tape Dental Injury: Teeth and Oropharynx as per pre-operative assessment

## 2015-01-10 NOTE — Discharge Instructions (Signed)
° ° °  Regional Anesthesia Blocks ° °1. Numbness or the inability to move the "blocked" extremity may last from 3-48 hours after placement. The length of time depends on the medication injected and your individual response to the medication. If the numbness is not going away after 48 hours, call your surgeon. ° °2. The extremity that is blocked will need to be protected until the numbness is gone and the  Strength has returned. Because you cannot feel it, you will need to take extra care to avoid injury. Because it may be weak, you may have difficulty moving it or using it. You may not know what position it is in without looking at it while the block is in effect. ° °3. For blocks in the legs and feet, returning to weight bearing and walking needs to be done carefully. You will need to wait until the numbness is entirely gone and the strength has returned. You should be able to move your leg and foot normally before you try and bear weight or walk. You will need someone to be with you when you first try to ensure you do not fall and possibly risk injury. ° °4. Bruising and tenderness at the needle site are common side effects and will resolve in a few days. ° °5. Persistent numbness or new problems with movement should be communicated to the surgeon or the [facility] ([phone])/ [facility] ([phone]). ° ° ° °Post Anesthesia Home Care Instructions ° °Activity: °Get plenty of rest for the remainder of the day. A responsible adult should stay with you for 24 hours following the procedure.  °For the next 24 hours, DO NOT: °-Drive a car °-Operate machinery °-Drink alcoholic beverages °-Take any medication unless instructed by your physician °-Make any legal decisions or sign important papers. ° °Meals: °Start with liquid foods such as gelatin or soup. Progress to regular foods as tolerated. Avoid greasy, spicy, heavy foods. If nausea and/or vomiting occur, drink only clear liquids until  the nausea and/or vomiting subsides. Call your physician if vomiting continues. ° °Special Instructions/Symptoms: °Your throat may feel dry or sore from the anesthesia or the breathing tube placed in your throat during surgery. If this causes discomfort, gargle with warm salt water. The discomfort should disappear within 24 hours. ° °If you had a scopolamine patch placed behind your ear for the management of post- operative nausea and/or vomiting: ° °1. The medication in the patch is effective for 72 hours, after which it should be removed.  Wrap patch in a tissue and discard in the trash. Wash hands thoroughly with soap and water. °2. You may remove the patch earlier than 72 hours if you experience unpleasant side effects which may include dry mouth, dizziness or visual disturbances. °3. Avoid touching the patch. Wash your hands with soap and water after contact with the patch. °  ° °

## 2015-01-10 NOTE — Transfer of Care (Signed)
Immediate Anesthesia Transfer of Care Note  Patient: Nicole Oliver  Procedure(s) Performed: Procedure(s) with comments: KNEE ARTHROSCOPY WITH ANTERIOR CRUCIATE LIGAMENT (ACL) RECONSTRUCTION WITH BONE-TENDON-BONE (PATELLAR TENDON) ALLOGRAFT.    ARTHROSCOPIC ACL RECONSTRUCTION WITH BONE-TENDON-BONE (PATELLAR TENDON) ALLOGRAFT, 5\' 5" , 170#. (Left) - ARTHROSCOPIC ACL RECONSTRUCTION WITH BONE-TENDON-BONE (PATELLAR TENDON) ALLOGRAFT, 5\' 5" , 170#.  Patient Location: PACU  Anesthesia Type:GA combined with regional for post-op pain  Level of Consciousness: awake and patient cooperative  Airway & Oxygen Therapy: Patient Spontanous Breathing and Patient connected to face mask oxygen  Post-op Assessment: Report given to RN and Post -op Vital signs reviewed and stable  Post vital signs: Reviewed and stable  Last Vitals:  Filed Vitals:   01/10/15 0936  BP:   Pulse: 78  Temp:   Resp: 16    Complications: No apparent anesthesia complications

## 2015-01-10 NOTE — Op Note (Signed)
PATIENT ID:      Nicole Oliver  MRN:     [id number] DOB/AGE:    November 26, 1965 / 49 y.o.       OPERATIVE REPORT    DATE OF PROCEDURE:  01/10/2015       PREOPERATIVE DIAGNOSIS:   TEAR ANTERIOR CRUCIATE LIGAMENT LEFT KNEE                                                       Estimated body mass index is 27.79 kg/(m^2) as calculated from the following:   Height as of this encounter: 5\' 5"  (1.651 m).   Weight as of this encounter: 75.751 kg (167 lb).     POSTOPERATIVE DIAGNOSIS:   SAME                                                                   Estimated body mass index is 27.79 kg/(m^2) as calculated from the following:   Height as of this encounter: 5\' 5"  (1.651 m).   Weight as of this encounter: 75.751 kg (167 lb).     PROCEDURE:  Procedure(s): KNEE ARTHROSCOPY WITH ANTERIOR CRUCIATE LIGAMENT (ACL) RECONSTRUCTION WITH BONE-TENDON-BONE (PATELLAR TENDON) ALLOGRAFT.    ARTHROSCOPIC ACL RECONSTRUCTION WITH BONE-TENDON-BONE (PATELLAR TENDON) ALLOGRAFT, 5\' 5" , 170#. LEFT KNEE     SURGEON:  Joni Fears, MD    ASSISTANT:   Biagio Borg, PA-C   (Present and scrubbed throughout the case, critical for assistance with exposure, retraction, instrumentation, and closure.)          ANESTHESIA: regional and general     DRAINS: none :      TOURNIQUET TIME: * Missing tourniquet times found for documented tourniquets in log:  [id number] *    COMPLICATIONS:  None   CONDITION:  stable  PROCEDURE IN DETAIL: [id number]   Joni Fears W 01/10/2015, 9:16 AM

## 2015-01-10 NOTE — Anesthesia Postprocedure Evaluation (Signed)
  Anesthesia Post-op Note  Patient: Nicole Oliver  Procedure(s) Performed: Procedure(s) (LRB): KNEE ARTHROSCOPY WITH ANTERIOR CRUCIATE LIGAMENT (ACL) RECONSTRUCTION WITH BONE-TENDON-BONE (PATELLAR TENDON) ALLOGRAFT.    ARTHROSCOPIC ACL RECONSTRUCTION WITH BONE-TENDON-BONE (PATELLAR TENDON) ALLOGRAFT, 5\' 5" , 170#. (Left)  Patient Location: PACU  Anesthesia Type: GA combined with regional for post-op pain  Level of Consciousness: awake and alert   Airway and Oxygen Therapy: Patient Spontanous Breathing  Post-op Pain: mild  Post-op Assessment: Post-op Vital signs reviewed, Patient's Cardiovascular Status Stable, Respiratory Function Stable, Patent Airway and No signs of Nausea or vomiting  Last Vitals:  Filed Vitals:   01/10/15 1050  BP: 134/64  Pulse: 73  Temp: 36.8 C  Resp: 16    Post-op Vital Signs: stable   Complications: No apparent anesthesia complications

## 2015-01-10 NOTE — Progress Notes (Signed)
Assisted Dr. Rose with left, ultrasound guided, femoral block. Side rails up, monitors on throughout procedure. See vital signs in flow sheet. Tolerated Procedure well. 

## 2015-01-10 NOTE — Op Note (Signed)
Nicole Oliver, Nicole Oliver                ACCOUNT NO.:  [id number]  MEDICAL RECORD NO.:  [id number]  LOCATION:  [facility]                         FACILITY:  [facility]  PHYSICIAN:  Vonna Kotyk. Whitfield, M.D.DATE OF BIRTH:  03-02-1966  DATE OF PROCEDURE: DATE OF DISCHARGE:  12/11/2014                              OPERATIVE REPORT   PREOPERATIVE DIAGNOSIS:  Tear of anterior cruciate ligament, left knee.  POSTOPERATIVE DIAGNOSIS:  Tear of anterior cruciate ligament, left knee.  PROCEDURE:  Anterior cruciate ligament reconstruction, left knee, with allograft bone-tendon-bone.  SURGEON:  Joni Fears, M.D.  ASSISTANT:  Aaron Edelman D. Petrarca, PA-C.  ANESTHESIA:  General with supplemental femoral nerve block.  COMPLICATIONS:  None.  PROCEDURE DESCRIPTION:  The patient was met with her husband in the holding area, identified the left knee as the appropriate operative site and marked it accordingly.  She did receive a preoperative femoral nerve block per anesthesia.  The patient was then transported to room #6 and placed under general anesthesia without difficulty.  The left lower extremity was placed in a thigh holder and a thigh tourniquet.  Time-out was called.  Left lower extremity was then prepped with DuraPrep from the thigh holder to the tips of the toes.  Sterile draping was performed.  I injected the knee with 0.25% Marcaine with epinephrine and then performed a diagnostic arthroscopy using a medial and lateral parapatellar tendon puncture sites.  Arthroscope was placed in the lateral portal.  There was no effusion. There were no loose bodies.  I did not see any appreciable chondromalacia of the patella, the trochlea or the medial and lateral compartments.  Both menisci were intact.  PCL remained intact.  ACL was torn from its femoral attachment and there was a positive anterior drawer sign, positive Lachman's and pivot shift.  At that point, we proceeded with the ACL reconstruction.   Mr. Baldwin [place] prepared the allograft on the separate table, measured 85 mm in length, 10 mm wide.  There were 25 mm bone plugs.  He did tube the graft with 2- 0 Vicryl.  I prepared the notch with a notchplasty.  I debrided the remaining ACL, maintaining the PCL.  Bony notchplasty was performed  with a coded 4 mm bur, had a very nice notchplasty.  The Arthrex guide system was utilized.  We initially inserted the tibial guide and measured the appropriate angle and length based on the size of the graft. Less than an inch incision was made along the medial tibia, paralleling the patellar tendon but medial to it.  Soft tissue was then elevated from the proximal tibia.  The guide was inserted. Guide pin was drilled through the center of the ACL footprint, directed towards the notch and about 5 mm anterior to the PCL.  We thought we had excellent position, checking it with the arthroscope both medially and laterally. A 10 mm drill hole was then made.  Any soft tissue was removed from the drill hole.  Femoral guide was then inserted.  The knee was hyperflexed.  The guide inserted.  The drill hole and then the drill inserted through the femur exiting along the anterior distal lateral thigh.  We checked the position  and then performed a 30 mm drill hole 10 mm wide, at 5 mm increments.  We checked to be sure we were in bone.  The joint was read cleared of any debris.  The ACL allograft was then guided to the tibial hole in the joint and into the femur.  We then applied tension proximally and distally, checked to be sure that the graft was not impinged, until we had an excellent notchplasty.  Separate incision was then made through the patellar tendon, approximately half an inch in length, and the Nitinol guide pin inserted into the femoral hole.  An 8 x 25 metallic interference screw was then placed with the sheath over the guide pin and screwed in place.  The head was buried until we had  excellent purchase. There was no motion of the graft at that point.  Second interference screw was then placed in the tibia over the guide pin, 9 x 25 mm with the knee in about 30 degrees of flexion with the posterior drawer and tension applied distally.  The interference screw was inserted until we had excellent purchase.  I checked the graft, was intact.  There was no impingement, and with the probe, felt we had excellent tension.  There was a significant anterior drawer sign preoperatively and there was maybe 1 mm post insertion of the graft. The joint was explored with evidence of loose material.  Sutures were removed from the graft.  Wounds were irrigated with saline solution. The 0.5 inch incisions were closed and the subcu with 3-0 Monocryl and then Steri-Strips over benzoin. Sterile bulky dressing was applied, followed by an Ace bandage, knee immobilizer, then ice pack.  The patient was then awoken and returned to the postanesthesia recovery room in satisfactory condition.  PLAN:  Percocet for pain, crutches, office 1 week.     Vonna Kotyk. Durward Fortes, M.D.     PWW/MEDQ  D:  01/10/2015  T:  01/10/2015  Job:  [id number]

## 2015-01-11 ENCOUNTER — Encounter (HOSPITAL_BASED_OUTPATIENT_CLINIC_OR_DEPARTMENT_OTHER): Payer: Self-pay | Admitting: Orthopaedic Surgery

## 2015-01-15 ENCOUNTER — Encounter (HOSPITAL_BASED_OUTPATIENT_CLINIC_OR_DEPARTMENT_OTHER): Payer: Self-pay | Admitting: Orthopaedic Surgery

## 2015-01-30 ENCOUNTER — Ambulatory Visit: Payer: 59 | Attending: Orthopaedic Surgery

## 2015-01-30 DIAGNOSIS — M6281 Muscle weakness (generalized): Secondary | ICD-10-CM | POA: Diagnosis not present

## 2015-01-30 DIAGNOSIS — M25662 Stiffness of left knee, not elsewhere classified: Secondary | ICD-10-CM | POA: Diagnosis present

## 2015-01-30 DIAGNOSIS — R262 Difficulty in walking, not elsewhere classified: Secondary | ICD-10-CM | POA: Diagnosis present

## 2015-01-30 DIAGNOSIS — M7989 Other specified soft tissue disorders: Secondary | ICD-10-CM | POA: Insufficient documentation

## 2015-01-30 NOTE — Therapy (Signed)
[facility] [address] [place], [place], [address] Phone: [phone]   Fax:  [phone]  Physical Therapy Evaluation  Patient Details  Name: Nicole Oliver MRN: [id number] Date of Birth: Feb 29, 1966 Referring Provider:  Gaynelle Arabian, MD/ Dr Joni Fears  Encounter Date: 01/30/2015      PT End of Session - 01/30/15 1048    Visit Number 1   Date for PT Re-Evaluation 03/27/15   PT Start Time 1016   PT Stop Time 1055   PT Time Calculation (min) 39 min   Equipment Utilized During Treatment Left knee immobilizer   Activity Tolerance Patient tolerated treatment well   Behavior During Therapy [facility] for tasks assessed/performed      Past Medical History  Diagnosis Date  . Anxiety   . ACL tear 12/2014    left  . Dental crown present   . Hyperlipidemia     Past Surgical History  Procedure Laterality Date  . Nasal reconstruction with septal repair    . Total abdominal hysterectomy w/ bilateral salpingoophorectomy  06/25/2003  . Strabismus surgery    . Knee arthroscopy with anterior cruciate ligament (acl) repair Left 01/10/2015    Procedure: KNEE ARTHROSCOPY WITH ANTERIOR CRUCIATE LIGAMENT (ACL) RECONSTRUCTION WITH BONE-TENDON-BONE (PATELLAR TENDON) ALLOGRAFT.    ARTHROSCOPIC ACL RECONSTRUCTION WITH BONE-TENDON-BONE (PATELLAR TENDON) ALLOGRAFT, 5\' 5" , 170#.;  Surgeon: Garald Balding, MD;  Location: [facility];  Service: Orthopedics;  Laterality: Left;  ARTHROSCOPIC ACL RECONSTRUCTION WITH BONE-TENDON-BONE (    There were no vitals filed for this visit.  Visit Diagnosis:  Muscle weakness of lower extremity - Plan: PT plan of care cert/re-cert  Joint stiffness of knee, left - Plan: PT plan of care cert/re-cert  Difficulty in walking - Plan: PT plan of care cert/re-cert  Swelling of limb - Plan: PT plan of care cert/re-cert      Subjective Assessment - 01/30/15 1017    Subjective Pt  presents to PT s/p Lt ACL repair.  Pt sustained injury at the end of March 2016 and had rehab until 12/11/14. Pt has been in immobilizer since surgery.     Pertinent History ACL repair 01/11/15   How long can you stand comfortably? limited with housework, need to sit down frequently   Patient Stated Goals walk without brace, strengthen knee, stand and walk longer   Currently in Pain? Yes   Pain Score 3    Pain Location Knee   Pain Orientation Left   Pain Descriptors / Indicators Aching;Dull   Pain Type Surgical pain   Pain Onset 1 to 4 weeks ago   Pain Frequency Intermittent   Aggravating Factors  walking, moving knee, standing   Pain Relieving Factors rest, pain medication, ice   Multiple Pain Sites No            [facility] PT Assessment - 01/30/15 0001    Assessment   Medical Diagnosis Lt knee pain s/p ACL repair   Onset Date/Surgical Date 01/11/15  surgery   Prior Therapy prior to surgery   Precautions   Precautions Other (comment)  follow ACL protocol   Precaution Comments follow protocol   Required Braces or Orthoses Knee Immobilizer - Left   Knee Immobilizer - Left Other (comment)  MD wants PT to assess for long leg hinge brace   Restrictions   Weight Bearing Restrictions No   Balance Screen   Has the patient fallen in the past 6 months No  How many times? 1  when injured ACL, fell off stool   Has the patient had a decrease in activity level because of a fear of falling?  No   Is the patient reluctant to leave their home because of a fear of falling?  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   Cognition   Overall Cognitive Status Within [employer] for tasks assessed   Observation/Other Assessments   Focus on Therapeutic Outcomes (FOTO)  61% limitation   Posture/Postural Control   Posture/Postural Control No significant limitations   ROM / Strength   AROM / PROM / Strength AROM;PROM;Strength   AROM   Left Knee Extension 6   Left Knee Flexion 74   PROM   Right/Left Knee Left   Left Knee Extension 4   Left Knee Flexion 83   Strength   Left Hip Flexion 4+/5   Left Hip ABduction 4+/5   Left Knee Extension --  fair quad set, not formally tested due to post-op   Palpation   Palpation comment warmth and edema about the Lt joint line.  Surgical incisions are healing well.  Good patellar mobility.  No significant palpable tenderness today   Ambulation/Gait   Ambulation/Gait Yes   Ambulation/Gait Assistance 6: Modified independent (Device/Increase time)   Assistive device Crutches   Gait Pattern Step-to pattern   Ambulation Surface Level   Gait velocity reduced gait velocity wearing long axis immobilizer                   [facility] Adult PT Treatment/Exercise - 01/30/15 0001    Modalities   Modalities Vasopneumatic   Vasopneumatic   Number Minutes Vasopneumatic  --   Vasopnuematic Location  --   Vasopneumatic Pressure --   Vasopneumatic Temperature  --                PT Education - 01/30/15 1047    Education provided Yes   Education Details HEP: quad sets, short arc quads, heel slides   Person(s) Educated Patient   Methods Explanation;Demonstration   Comprehension Verbalized understanding;Returned demonstration          PT Short Term Goals - 01/30/15 1057    PT SHORT TERM GOAL #1   Title be independent in initial HEP   Time 4   Period Weeks   Status New   PT SHORT TERM GOAL #2   Title demonstrate Lt knee AROM flexion to 90 degrees to allow for sitting without substitution   Time 4   Period  Weeks   Status New   PT SHORT TERM GOAL #3   Title use 1 crutch for all ambulation wearing hinged brace   Time 4   Period Weeks           PT Long Term Goals - 01/30/15 1014    PT LONG TERM GOAL #1   Title be independent in advanced HEP   Period Weeks   Status New   PT LONG TERM GOAL #2   Title  reduce FOTO to < or = to 40% limitation   Time 8   Period Weeks   Status New   PT LONG TERM GOAL #3   Title demonstrate Lt quad strength to 4/5 to improve safety with ambulation   Time 8   Period Weeks   Status New   PT LONG TERM GOAL #4   Title wean from crutches for all distances    Time 8   Period Weeks   Status New   PT LONG TERM GOAL #5   Title demonstrate Lt knee AROM flexion to > or = to 110 degrees to improve mobility   Time 8   Period Weeks   Status New   PT LONG TERM GOAL #6   Title improve Lt knee strength to ascend and descend steps with step-over-step gait   Time 8   Period Weeks   Status New               Plan - 01/30/15 1053    Clinical Impression Statement Pt presents to PT s/p Lt ACL repair on 01/11/15.  Pt presents with quad weakness, gait abnormality and limited Lt knee AROM.  Pt ambulates with bilateral crutches with long leg immobilizer.  FOTO score is 61% limitation.  Pt will benfit from skilled PT to safely progress strength, ROM and gait post-op to allow for return to prior level of function.     Pt will benefit from skilled therapeutic intervention in order to improve on the following deficits Abnormal gait;Decreased activity tolerance;Decreased range of motion;Decreased strength;Difficulty walking;Pain;Impaired flexibility;Increased edema   Rehab Potential Good   PT Frequency 2x / week   PT Duration 8 weeks   PT Treatment/Interventions ADLs/Self Care Home Management;Cryotherapy;Banker;Therapeutic activities;Therapeutic exercise;Neuromuscular re-education;Patient/family education;Manual  techniques;Vasopneumatic Device;Passive range of motion   PT Next Visit Plan Follow protocol s/p ACL repair.  Lt quad strength, adjust hinged brace if patient brings it.  Proprioception.  Edema management as needed.     Consulted and Agree with Plan of Care Patient         Problem List Patient Active Problem List   Diagnosis Date Noted  . ACL (anterior cruciate ligament) tear 01/10/2015  . Finger pain 04/13/2012    Sheridan Hew, PT 01/30/2015, 11:05 AM  [facility] [address], [place], [place], [address] Phone: [phone]   Fax:  [phone]

## 2015-01-30 NOTE — Patient Instructions (Signed)
Knee Extension: Short Arc (Eccentric) - Supine or Sitting   Lie on back with roll under knee. Extend knee. Slowly lower foot for 3-5 seconds. _10__ reps per set, _5__ sets per day, _7__ days per week.   Copyright  VHI. All rights reserved.  Quad Set   With other leg bent, foot flat, slowly tighten muscles on thigh of straight leg while counting out loud to _5___. Repeat _10-20___ times. Do __5Hip Flexion / Knee Extension: Straight-Leg Raise (Eccentric)  Heel Slides   Use a strap or towel to Slide left heel along bed towards bottom. Hold for _5__ seconds. Perform gently. Slide back to flat knee position. Repeat 10___ times. Do _3__ times a day.   [facility] [address], [place], [place] [address] Phone # [phone] Fax [phone]

## 2015-02-04 ENCOUNTER — Ambulatory Visit: Payer: 59 | Attending: Orthopaedic Surgery

## 2015-02-04 DIAGNOSIS — M6281 Muscle weakness (generalized): Secondary | ICD-10-CM | POA: Insufficient documentation

## 2015-02-04 DIAGNOSIS — R262 Difficulty in walking, not elsewhere classified: Secondary | ICD-10-CM | POA: Diagnosis not present

## 2015-02-04 DIAGNOSIS — S83512A Sprain of anterior cruciate ligament of left knee, initial encounter: Secondary | ICD-10-CM

## 2015-02-04 DIAGNOSIS — M7989 Other specified soft tissue disorders: Secondary | ICD-10-CM | POA: Diagnosis not present

## 2015-02-04 DIAGNOSIS — Z9889 Other specified postprocedural states: Secondary | ICD-10-CM | POA: Diagnosis not present

## 2015-02-04 DIAGNOSIS — M25662 Stiffness of left knee, not elsewhere classified: Secondary | ICD-10-CM | POA: Insufficient documentation

## 2015-02-04 NOTE — Therapy (Signed)
Nicole Oliver, [place], [address] Phone: [phone]   Fax:  [phone]  Physical Therapy Treatment  Patient Details  Name: Nicole Oliver MRN: [id number] Date of Birth: 09-22-1965 Referring Provider:  Gaynelle Arabian, MD  Encounter Date: 02/04/2015      PT End of Session - 02/04/15 1404    Visit Number 2   Number of Visits 21   Date for PT Re-Evaluation 03/27/15   PT Start Time 7628   PT Stop Time 1115   PT Time Calculation (min) 60 min   Activity Tolerance Patient tolerated treatment well   Behavior During Therapy [facility] for tasks assessed/performed      Past Medical History  Diagnosis Date  . Anxiety   . ACL tear 12/2014    left  . Dental crown present   . Hyperlipidemia     Past Surgical History  Procedure Laterality Date  . Nasal reconstruction with septal repair    . Total abdominal hysterectomy w/ bilateral salpingoophorectomy  06/25/2003  . Strabismus surgery    . Knee arthroscopy with anterior cruciate ligament (acl) repair Left 01/10/2015    Procedure: KNEE ARTHROSCOPY WITH ANTERIOR CRUCIATE LIGAMENT (ACL) RECONSTRUCTION WITH BONE-TENDON-BONE (PATELLAR TENDON) ALLOGRAFT.    ARTHROSCOPIC ACL RECONSTRUCTION WITH BONE-TENDON-BONE (PATELLAR TENDON) ALLOGRAFT, 5\' 5" , 170#.;  Surgeon: Garald Balding, MD;  Location: [facility];  Service: Orthopedics;  Laterality: Left;  ARTHROSCOPIC ACL RECONSTRUCTION WITH BONE-TENDON-BONE (    There were no vitals filed for this visit.  Visit Diagnosis:  Muscle weakness of lower extremity  Joint stiffness of knee, left  Difficulty in walking  Swelling of limb  ACL tear, left, initial encounter      Subjective Assessment - 02/04/15 1023    Subjective Stiff but feels stronger.  She is doing her HEP with QS and SLR   Currently in Pain? Yes   Pain Score 2    Pain Location Knee   Pain Orientation Left   Pain Descriptors / Indicators  Aching   Pain Type Surgical pain   Pain Onset 1 to 4 weeks ago   Pain Frequency Intermittent   Aggravating Factors  walking and weight earing   Pain Relieving Factors rest , cold   Multiple Pain Sites No                         [facility] Adult PT Treatment/Exercise - 02/04/15 1030    Knee/Hip Exercises: Stretches   Knee: Self-Stretch to increase Flexion Left  15 reps  for range   Knee: Self-Stretch Limitations and with thigh vertical with grravity knee bend x 12   Knee/Hip Exercises: Supine   Quad Sets Left;15 reps   Short Arc [employer] 20 reps;Left;Strengthening;1 set   Short Arc Quad Sets Limitations cued to press knee into ball.    Straight Leg Raises AROM;Left;15 reps   Cryotherapy   Number Minutes Cryotherapy 15 Minutes   Cryotherapy Location Knee   Type of Cryotherapy Ice pack   Manual Therapy   Manual Therapy Soft tissue mobilization   Soft tissue mobilization With use of [place] to thigh and patella tendon     Standing mini squats x15 and 4 inch step ups x 12 and heel raises x15              PT Short Term Goals - 01/30/15 1057    PT SHORT TERM GOAL #1   Title be  independent in initial HEP   Time 4   Period Weeks   Status New   PT SHORT TERM GOAL #2   Title demonstrate Lt knee AROM flexion to 90 degrees to allow for sitting without substitution   Time 4   Period Weeks   Status New   PT SHORT TERM GOAL #3   Title use 1 crutch for all ambulation wearing hinged brace   Time 4   Period Weeks           PT Long Term Goals - 01/30/15 1014    PT LONG TERM GOAL #1   Title be independent in advanced HEP   Period Weeks   Status New   PT LONG TERM GOAL #2   Title reduce FOTO to < or = to 40% limitation   Time 8   Period Weeks   Status New   PT LONG TERM GOAL #3   Title demonstrate Lt quad strength to 4/5 to improve safety with ambulation   Time 8   Period Weeks   Status New   PT LONG TERM GOAL #4   Title wean from crutches for all  distances    Time 8   Period Weeks   Status New   PT LONG TERM GOAL #5   Title demonstrate Lt knee AROM flexion to > or = to 110 degrees to improve mobility   Time 8   Period Weeks   Status New   PT LONG TERM GOAL #6   Title improve Lt knee strength to ascend and descend steps with step-over-step gait   Time 8   Period Weeks   Status New               Plan - 02/04/15 1404    Clinical Impression Statement [name] did well with 3 week phase oif exercises. Range improved past 90 degres by end. Pain limited flexion range. Will advance to week 4 if appropriate   PT Next Visit Plan Follow protocol s/p ACL repair.  Lt quad strength, adjust hinged brace if patient brings it.  Proprioception.  Edema management as needed.     Consulted and Agree with Plan of Care Patient        Problem List Patient Active Problem List   Diagnosis Date Noted  . ACL (anterior cruciate ligament) tear 01/10/2015  . Finger pain 04/13/2012    Darrel Hoover PT 02/04/2015, 2:16 PM  [facility] [address] [place], [place], [address] Phone: [phone]   Fax:  [phone]

## 2015-02-05 ENCOUNTER — Encounter: Payer: Self-pay | Admitting: Physical Therapy

## 2015-02-05 ENCOUNTER — Ambulatory Visit: Payer: Self-pay | Admitting: Physical Therapy

## 2015-02-05 ENCOUNTER — Ambulatory Visit: Payer: 59 | Admitting: Physical Therapy

## 2015-02-07 ENCOUNTER — Ambulatory Visit: Payer: 59 | Admitting: Physical Therapy

## 2015-02-07 DIAGNOSIS — M25662 Stiffness of left knee, not elsewhere classified: Secondary | ICD-10-CM

## 2015-02-07 DIAGNOSIS — M7989 Other specified soft tissue disorders: Secondary | ICD-10-CM

## 2015-02-07 DIAGNOSIS — M6281 Muscle weakness (generalized): Secondary | ICD-10-CM | POA: Diagnosis not present

## 2015-02-07 DIAGNOSIS — R262 Difficulty in walking, not elsewhere classified: Secondary | ICD-10-CM

## 2015-02-07 NOTE — Therapy (Signed)
[facility] [place], [place], [address] Phone: [phone]   Fax:  [phone]  Physical Therapy Treatment  Patient Details  Name: Nicole Oliver MRN: [id number] Date of Birth: Aug 09, 1965 Referring Provider:  Gaynelle Arabian, MD  Encounter Date: 02/07/2015      PT End of Session - 02/07/15 0934    Visit Number 3   Number of Visits 21   Date for PT Re-Evaluation 03/27/15   PT Start Time 0848   PT Stop Time 8110   PT Time Calculation (min) 56 min   Activity Tolerance Patient tolerated treatment well;No increased pain   Behavior During Therapy [facility] for tasks assessed/performed      Past Medical History  Diagnosis Date  . Anxiety   . ACL tear 12/2014    left  . Dental crown present   . Hyperlipidemia     Past Surgical History  Procedure Laterality Date  . Nasal reconstruction with septal repair    . Total abdominal hysterectomy w/ bilateral salpingoophorectomy  06/25/2003  . Strabismus surgery    . Knee arthroscopy with anterior cruciate ligament (acl) repair Left 01/10/2015    Procedure: KNEE ARTHROSCOPY WITH ANTERIOR CRUCIATE LIGAMENT (ACL) RECONSTRUCTION WITH BONE-TENDON-BONE (PATELLAR TENDON) ALLOGRAFT.    ARTHROSCOPIC ACL RECONSTRUCTION WITH BONE-TENDON-BONE (PATELLAR TENDON) ALLOGRAFT, '5\' 5"' , 170#.;  Surgeon: Garald Balding, MD;  Location: [facility];  Service: Orthopedics;  Laterality: Left;  ARTHROSCOPIC ACL RECONSTRUCTION WITH BONE-TENDON-BONE (    There were no vitals filed for this visit.  Visit Diagnosis:  Joint stiffness of knee, left  Muscle weakness of lower extremity  Difficulty in walking  Swelling of limb      Subjective Assessment - 02/07/15 0852    Subjective Md said it may take a little longer to get leg stronger. Not to get discouraged.  2-3/10 n0w .  When she got up 5-6/10    Currently in Pain? Yes   Pain Score 3    Pain Location Knee   Pain Orientation Left    Pain Descriptors / Indicators --  stiff   Pain Frequency Intermittent   Aggravating Factors  1st thing in am.  bending   Pain Relieving Factors squeezind distal knee   Multiple Pain Sites No                         [facility] Adult PT Treatment/Exercise - 02/07/15 0856    Knee/Hip Exercises: Stretches   Passive Hamstring Stretch --  1 rep, good length   Knee/Hip Exercises: Standing   Other Standing Knee Exercises Band standing, Band on opposite extremity.  4 directions, 2 hande 10 X   Other Standing Knee Exercises terminal knee extension.  Red band 10 X   Knee/Hip Exercises: Seated   Heel Slides 10 reps   Knee/Hip Exercises: Supine   Quad Sets 10 reps  1 set small roll under   Short Arc Quad Sets 20 reps   Heel Slides 10 reps;2 sets   Heel Slides Limitations AROM 92   Straight Leg Raises AROM;Left;2 sets  cues   Vasopneumatic   Number Minutes Vasopneumatic  15 minutes   Vasopnuematic Location  Knee   Vasopneumatic Pressure --  moderate, elevated   Manual Therapy   Manual Therapy --  scar tissue mobilization                  PT Short Term Goals - 02/07/15 3159  PT SHORT TERM GOAL #1   Time 4   Period Weeks   Status On-going   PT SHORT TERM GOAL #2   Title demonstrate Lt knee AROM flexion to 90 degrees to allow for sitting without substitution   Time 4   Period Weeks   Status Achieved   PT SHORT TERM GOAL #3   Title use 1 crutch for all ambulation wearing hinged brace   Baseline 2   Time 4   Period Weeks   Status On-going   PT SHORT TERM GOAL #4   Title Left knee extension improved to 6 degrees needed for greater ease with ambulation for return to work.   Time 4   Period Weeks   Status On-going           PT Long Term Goals - 01/30/15 1014    PT LONG TERM GOAL #1   Title be independent in advanced HEP   Period Weeks   Status New   PT LONG TERM GOAL #2   Title reduce FOTO to < or = to 40% limitation   Time 8   Period Weeks    Status New   PT LONG TERM GOAL #3   Title demonstrate Lt quad strength to 4/5 to improve safety with ambulation   Time 8   Period Weeks   Status New   PT LONG TERM GOAL #4   Title wean from crutches for all distances    Time 8   Period Weeks   Status New   PT LONG TERM GOAL #5   Title demonstrate Lt knee AROM flexion to > or = to 110 degrees to improve mobility   Time 8   Period Weeks   Status New   PT LONG TERM GOAL #6   Title improve Lt knee strength to ascend and descend steps with step-over-step gait   Time 8   Period Weeks   Status New               Plan - 02/07/15 0935    Clinical Impression Statement STG for knee ROM met.  3/10 pain post exercise.  Able to progress to closed chain   PT Next Visit Plan Week 4 protocol   PT Home Exercise Plan continue   Consulted and Agree with Plan of Care Patient        Problem List Patient Active Problem List   Diagnosis Date Noted  . ACL (anterior cruciate ligament) tear 01/10/2015  . Finger pain 04/13/2012    [facility] 02/07/2015, 9:38 AM  [facility] [address] [place], [place], [address] Phone: [phone]   Fax:  [phone]     Melvenia Needles, PTA 02/07/2015 9:38 AM Phone: [phone] Fax: [phone]

## 2015-02-12 ENCOUNTER — Ambulatory Visit: Payer: 59

## 2015-02-12 DIAGNOSIS — R262 Difficulty in walking, not elsewhere classified: Secondary | ICD-10-CM

## 2015-02-12 DIAGNOSIS — M25662 Stiffness of left knee, not elsewhere classified: Secondary | ICD-10-CM

## 2015-02-12 DIAGNOSIS — M7989 Other specified soft tissue disorders: Secondary | ICD-10-CM

## 2015-02-12 DIAGNOSIS — M6281 Muscle weakness (generalized): Secondary | ICD-10-CM

## 2015-02-12 NOTE — Patient Instructions (Signed)
Sit to stand from high chair using hip hinge technique daily 5-15 reps

## 2015-02-12 NOTE — Therapy (Signed)
[place], [place], [address] Phone: [phone]   Fax:  [phone]  Physical Therapy Treatment  Patient Details  Name: Nicole Oliver MRN: [id number] Date of Birth: Nov 19, 1965 Referring Provider:  Gaynelle Arabian, MD  Encounter Date: 02/12/2015      PT End of Session - 02/12/15 1419    Visit Number 4   Number of Visits 21   Date for PT Re-Evaluation 03/27/15   PT Start Time 0130   PT Stop Time 0230   PT Time Calculation (min) 60 min   Equipment Utilized During Treatment Gait belt   Activity Tolerance Patient tolerated treatment well   Behavior During Therapy [facility] for tasks assessed/performed      Past Medical History  Diagnosis Date  . Anxiety   . ACL tear 12/2014    left  . Dental crown present   . Hyperlipidemia     Past Surgical History  Procedure Laterality Date  . Nasal reconstruction with septal repair    . Total abdominal hysterectomy w/ bilateral salpingoophorectomy  06/25/2003  . Strabismus surgery    . Knee arthroscopy with anterior cruciate ligament (acl) repair Left 01/10/2015    Procedure: KNEE ARTHROSCOPY WITH ANTERIOR CRUCIATE LIGAMENT (ACL) RECONSTRUCTION WITH BONE-TENDON-BONE (PATELLAR TENDON) ALLOGRAFT.    ARTHROSCOPIC ACL RECONSTRUCTION WITH BONE-TENDON-BONE (PATELLAR TENDON) ALLOGRAFT, 5\' 5" , 170#.;  Surgeon: Garald Balding, MD;  Location: [place];  Service: Orthopedics;  Laterality: Left;  ARTHROSCOPIC ACL RECONSTRUCTION WITH BONE-TENDON-BONE (    There were no vitals filed for this visit.  Visit Diagnosis:  Joint stiffness of knee, left  Muscle weakness of lower extremity  Difficulty in walking  Swelling of limb      Subjective Assessment - 02/12/15 1331    Subjective Feels not doing as well as she should   Currently in Pain? Yes   Pain Score 2    Pain Location Knee   Pain Orientation Left   Pain Descriptors / Indicators Aching;Dull   Pain  Type Surgical pain   Pain Onset More than a month ago   Pain Frequency Intermittent   Aggravating Factors  Bending   Pain Relieving Factors cold   Multiple Pain Sites No            [facility] PT Assessment - 02/12/15 1340    AROM   Left Knee Extension 10   Left Knee Flexion 101                     [facility] Adult PT Treatment/Exercise - 02/12/15 1344    Ambulation/Gait   Ambulation Distance (Feet) 300 Feet   Assistive device Straight cane   Gait Pattern Step-through pattern;Decreased hip/knee flexion - left   Ambulation Surface Level;Indoor   Gait Comments Cued to flex Lt knee and hip with swing . Pattern was smooth and without LOB   Neuro Re-ed    Neuro Re-ed Details  Worked on sit to stand with hip hinge from mat with 3 inchh fpoam and from mat without She had some pai n with lowetr sit to stand but not bad. Cued to use hips to drive movement    Knee/Hip Exercises: Supine   Straight Leg Raises 15 reps;Left   Knee/Hip Exercises: Prone   Hamstring Curl 20 reps   Other Prone Exercises TKE x 20 on toes 5 sec hold   Cryotherapy   Number Minutes Cryotherapy 15 Minutes   Cryotherapy Location Knee  Type of Cryotherapy Ice pack  with elevation                PT Education - 02/12/15 1418    Education provided Yes   Education Details sit to stand with hip hinge   Person(s) Educated Patient   Methods Explanation;Demonstration;Tactile cues;Verbal cues   Comprehension Returned demonstration;Verbalized understanding          PT Short Term Goals - 02/07/15 0936    PT SHORT TERM GOAL #1   Time 4   Period Weeks   Status On-going   PT SHORT TERM GOAL #2   Title demonstrate Lt knee AROM flexion to 90 degrees to allow for sitting without substitution   Time 4   Period Weeks   Status Achieved   PT SHORT TERM GOAL #3   Title use 1 crutch for all ambulation wearing hinged brace   Baseline 2   Time 4   Period Weeks   Status On-going   PT SHORT TERM GOAL #4    Title Left knee extension improved to 6 degrees needed for greater ease with ambulation for return to work.   Time 4   Period Weeks   Status On-going           PT Long Term Goals - 01/30/15 1014    PT LONG TERM GOAL #1   Title be independent in advanced HEP   Period Weeks   Status New   PT LONG TERM GOAL #2   Title reduce FOTO to < or = to 40% limitation   Time 8   Period Weeks   Status New   PT LONG TERM GOAL #3   Title demonstrate Lt quad strength to 4/5 to improve safety with ambulation   Time 8   Period Weeks   Status New   PT LONG TERM GOAL #4   Title wean from crutches for all distances    Time 8   Period Weeks   Status New   PT LONG TERM GOAL #5   Title demonstrate Lt knee AROM flexion to > or = to 110 degrees to improve mobility   Time 8   Period Weeks   Status New   PT LONG TERM GOAL #6   Title improve Lt knee strength to ascend and descend steps with step-over-step gait   Time 8   Period Weeks   Status New               Plan - 02/12/15 1512    Clinical Impression Statement Nicole Oliver is doing well with increased flexion range and walking with SPC. She is not ready to be independent with single device but should be usng one arm device in about a wek. She was able to sit to stand with improved control but needs preactive using hip hinge to facilitate this   PT Next Visit Plan Week 4 protocol work sit to stand and walking with cane   PT Home Exercise Plan sit to stand    Consulted and Agree with Plan of Care Patient        Problem List Patient Active Problem List   Diagnosis Date Noted  . ACL (anterior cruciate ligament) tear 01/10/2015  . Finger pain 04/13/2012    Darrel Hoover PT 02/12/2015, 3:15 PM  [facility] [address] [place], [place], [address] Phone: [phone]   Fax:  [phone]

## 2015-02-15 ENCOUNTER — Ambulatory Visit: Payer: 59 | Admitting: Physical Therapy

## 2015-02-15 DIAGNOSIS — S83512A Sprain of anterior cruciate ligament of left knee, initial encounter: Secondary | ICD-10-CM

## 2015-02-15 DIAGNOSIS — M7989 Other specified soft tissue disorders: Secondary | ICD-10-CM

## 2015-02-15 DIAGNOSIS — R262 Difficulty in walking, not elsewhere classified: Secondary | ICD-10-CM

## 2015-02-15 DIAGNOSIS — M6281 Muscle weakness (generalized): Secondary | ICD-10-CM | POA: Diagnosis not present

## 2015-02-15 DIAGNOSIS — M25662 Stiffness of left knee, not elsewhere classified: Secondary | ICD-10-CM

## 2015-02-15 NOTE — Therapy (Signed)
[place], [place], [address] Phone: [phone]   Fax:  [phone]  Physical Therapy Treatment  Patient Details  Name: Nicole Oliver MRN: [id number] Date of Birth: Oct 22, 1965 Referring Provider:  Gaynelle Arabian, MD  Encounter Date: 02/15/2015      PT End of Session - 02/15/15 0949    Visit Number 5   Number of Visits 21   Date for PT Re-Evaluation 03/27/15   Authorization Type [facility]   PT Start Time 0929   PT Stop Time 1030   PT Time Calculation (min) 61 min      Past Medical History  Diagnosis Date  . Anxiety   . ACL tear 12/2014    left  . Dental crown present   . Hyperlipidemia     Past Surgical History  Procedure Laterality Date  . Nasal reconstruction with septal repair    . Total abdominal hysterectomy w/ bilateral salpingoophorectomy  06/25/2003  . Strabismus surgery    . Knee arthroscopy with anterior cruciate ligament (acl) repair Left 01/10/2015    Procedure: KNEE ARTHROSCOPY WITH ANTERIOR CRUCIATE LIGAMENT (ACL) RECONSTRUCTION WITH BONE-TENDON-BONE (PATELLAR TENDON) ALLOGRAFT.    ARTHROSCOPIC ACL RECONSTRUCTION WITH BONE-TENDON-BONE (PATELLAR TENDON) ALLOGRAFT, 5\' 5" , 151#.;  Surgeon: Garald Balding, MD;  Location: [place];  Service: Orthopedics;  Laterality: Left;  ARTHROSCOPIC ACL RECONSTRUCTION WITH BONE-TENDON-BONE (    There were no vitals filed for this visit.  Visit Diagnosis:  Joint stiffness of knee, left  Muscle weakness of lower extremity  Difficulty in walking  Swelling of limb  ACL tear, left, initial encounter      Subjective Assessment - 02/15/15 0949    Subjective Its not bad   Currently in Pain? Yes   Pain Score 3    Pain Location Knee   Pain Orientation Left                         [facility] Adult PT Treatment/Exercise - 02/15/15 0001    Ambulation/Gait   Ambulation/Gait Yes   Ambulation Distance (Feet) 200 Feet    Assistive device R Axillary Crutch   Gait Pattern Step-through pattern;Decreased hip/knee flexion - left   Ambulation Surface Level;Indoor   Knee/Hip Exercises: Stretches   Knee: Self-Stretch to increase Flexion Left  15 reps  for range   Knee: Self-Stretch Limitations and with thigh vertical with grravity knee bend x 12   Knee/Hip Exercises: Aerobic   Stationary Bike Recumbent full revolutions progressed to Level 1 x 5 minutes, pt to begin bicycling at home   Knee/Hip Exercises: Standing   Wall Squat 10 reps   Wall Squat Limitations mini squats 40 degrees   SLS with Vectors x 1o march, abduction and ext with SLS on left and UE support, also weight shifting on foam pad   Other Standing Knee Exercises sit-stand x 10 focus on hip hinge   Knee/Hip Exercises: Seated   Heel Slides 10 reps   Knee/Hip Exercises: Supine   Quad Sets 10 reps  1 set small roll under   Short Arc Quad Sets 20 reps   Heel Slides 10 reps;2 sets   Heel Slides Limitations AROM 105   Straight Leg Raises 15 reps;Left   Vasopneumatic   Number Minutes Vasopneumatic  15 minutes   Vasopnuematic Location  Knee   Vasopneumatic Pressure Medium   Vasopneumatic Temperature  32  PT Education - 02/15/15 1015    Education provided Yes   Education Details wall slide 40 degrees, bicycle at home   Person(s) Educated Patient   Methods Explanation;Handout   Comprehension Verbalized understanding          PT Short Term Goals - 02/07/15 0936    PT SHORT TERM GOAL #1   Time 4   Period Weeks   Status On-going   PT SHORT TERM GOAL #2   Title demonstrate Lt knee AROM flexion to 90 degrees to allow for sitting without substitution   Time 4   Period Weeks   Status Achieved   PT SHORT TERM GOAL #3   Title use 1 crutch for all ambulation wearing hinged brace   Baseline 2   Time 4   Period Weeks   Status On-going   PT SHORT TERM GOAL #4   Title Left knee extension improved to 6 degrees needed for  greater ease with ambulation for return to work.   Time 4   Period Weeks   Status On-going           PT Long Term Goals - 01/30/15 1014    PT LONG TERM GOAL #1   Title be independent in advanced HEP   Period Weeks   Status New   PT LONG TERM GOAL #2   Title reduce FOTO to < or = to 40% limitation   Time 8   Period Weeks   Status New   PT LONG TERM GOAL #3   Title demonstrate Lt quad strength to 4/5 to improve safety with ambulation   Time 8   Period Weeks   Status New   PT LONG TERM GOAL #4   Title wean from crutches for all distances    Time 8   Period Weeks   Status New   PT LONG TERM GOAL #5   Title demonstrate Lt knee AROM flexion to > or = to 110 degrees to improve mobility   Time 8   Period Weeks   Status New   PT LONG TERM GOAL #6   Title improve Lt knee strength to ascend and descend steps with step-over-step gait   Time 8   Period Weeks   Status New               Plan - 02/15/15 1008    Clinical Impression Statement Instructed pt in gait with single crutch as well as closed chain and proprioception exercises per protocol with good tolerance. Pt is progressing toward ROM goals and gait with unilateral device.    PT Next Visit Plan Week 4 protocol work sit to stand and walking with cane/crutch, continue flexibility-gentle AA heel slide   PT Home Exercise Plan sit-stand, wall slide, bicycle         Problem List Patient Active Problem List   Diagnosis Date Noted  . ACL (anterior cruciate ligament) tear 01/10/2015  . Finger pain 04/13/2012    Dorene Ar, PTA 02/15/2015, 10:21 AM  [facility] [address] [place], [place], [address] Phone: [phone]   Fax:  [phone]

## 2015-02-15 NOTE — Patient Instructions (Signed)
Back Wall Slide   With feet _12___ inches from wall, lean as much of back against the wall as possible. Gently squat down __a fe inches_ inches, keeping back against wall. Hold _5___ seconds while counting out loud. Repeat __10__ times. Do _2___ sessions per day.  http://gt2.exer.us/564   Copyright  VHI. All rights reserved.

## 2015-02-19 ENCOUNTER — Ambulatory Visit: Payer: 59 | Admitting: Physical Therapy

## 2015-02-19 DIAGNOSIS — M25662 Stiffness of left knee, not elsewhere classified: Secondary | ICD-10-CM

## 2015-02-19 DIAGNOSIS — M6281 Muscle weakness (generalized): Secondary | ICD-10-CM

## 2015-02-19 DIAGNOSIS — S83512A Sprain of anterior cruciate ligament of left knee, initial encounter: Secondary | ICD-10-CM

## 2015-02-19 DIAGNOSIS — R262 Difficulty in walking, not elsewhere classified: Secondary | ICD-10-CM

## 2015-02-19 DIAGNOSIS — M7989 Other specified soft tissue disorders: Secondary | ICD-10-CM

## 2015-02-19 NOTE — Therapy (Signed)
[place], [place], [address] Phone: [phone]   Fax:  [phone]  Physical Therapy Treatment  Patient Details  Name: Nicole Oliver MRN: [id number] Date of Birth: Nov 10, 1965 Referring Provider:  Gaynelle Arabian, MD  Encounter Date: 02/19/2015      PT End of Session - 02/19/15 1819    Visit Number 6   Number of Visits 21   Date for PT Re-Evaluation 03/27/15   Authorization Type [facility] UMR   PT Start Time 6967   PT Stop Time 1240   PT Time Calculation (min) 55 min   Activity Tolerance Patient tolerated treatment well      Past Medical History  Diagnosis Date  . Anxiety   . ACL tear 12/2014    left  . Dental crown present   . Hyperlipidemia     Past Surgical History  Procedure Laterality Date  . Nasal reconstruction with septal repair    . Total abdominal hysterectomy w/ bilateral salpingoophorectomy  06/25/2003  . Strabismus surgery    . Knee arthroscopy with anterior cruciate ligament (acl) repair Left 01/10/2015    Procedure: KNEE ARTHROSCOPY WITH ANTERIOR CRUCIATE LIGAMENT (ACL) RECONSTRUCTION WITH BONE-TENDON-BONE (PATELLAR TENDON) ALLOGRAFT.    ARTHROSCOPIC ACL RECONSTRUCTION WITH BONE-TENDON-BONE (PATELLAR TENDON) ALLOGRAFT, 5\' 5" , 170#.;  Surgeon: Garald Balding, MD;  Location: [place];  Service: Orthopedics;  Laterality: Left;  ARTHROSCOPIC ACL RECONSTRUCTION WITH BONE-TENDON-BONE (    There were no vitals filed for this visit.  Visit Diagnosis:  Joint stiffness of knee, left  Muscle weakness of lower extremity  Difficulty in walking  Swelling of limb  ACL tear, left, initial encounter      Subjective Assessment - 02/19/15 1155    Subjective I can walk at home with one crutch but I thought I should bring both to walk across the parking lot.     Currently in Pain? Yes   Pain Score 3    Pain Location Knee   Pain Orientation Left   Pain Type Surgical pain   Pain Onset More than a month ago   Aggravating Factors  bending                          [facility] Adult PT Treatment/Exercise - 02/19/15 1202    Knee/Hip Exercises: Stretches   Active Hamstring Stretch Left;3 reps;30 seconds   Quad Stretch Left;3 reps;20 seconds   Knee/Hip Exercises: Aerobic   Stationary Bike Bike full revolutions 5 min   Knee/Hip Exercises: Machines for Strengthening   Cybex Knee Flexion Leg press 20# B 20x   Knee/Hip Exercises: Standing   Heel Raises 20 reps   Knee Flexion Strengthening;Right;1 set;10 reps  WB on left   Hip ADduction AROM;Both;1 set   Forward Step Up Left;1 set;15 reps;Hand Hold: 2;Step Height: 4"   Wall Squat Limitations mini squats 40 degrees   Other Standing Knee Exercises hip extension 15 x R/L   Vasopneumatic   Number Minutes Vasopneumatic  15 minutes   Vasopnuematic Location  Knee   Vasopneumatic Pressure Medium   Vasopneumatic Temperature  32                  PT Short Term Goals - 02/19/15 1824    PT SHORT TERM GOAL #1   Title be independent in initial HEP   Time 4   Period Weeks   Status On-going   PT SHORT TERM GOAL #  2   Title demonstrate Lt knee AROM flexion to 90 degrees to allow for sitting without substitution   Status Achieved   PT SHORT TERM GOAL #3   Title use 1 crutch for all ambulation wearing hinged brace   Time 4   Period Weeks   Status Achieved   PT SHORT TERM GOAL #4   Title Left knee extension improved to 6 degrees needed for greater ease with ambulation for return to work.   Time 4   Period Weeks   Status On-going           PT Long Term Goals - 02/19/15 1825    PT LONG TERM GOAL #1   Title be independent in advanced HEP   Time 8   Period Weeks   Status On-going   PT LONG TERM GOAL #2   Title reduce FOTO to < or = to 40% limitation   Time 8   Period Weeks   Status On-going   PT LONG TERM GOAL #3   Title demonstrate Lt quad strength to 4/5 to improve safety with  ambulation   Time 8   Period Weeks   Status On-going   PT LONG TERM GOAL #4   Title wean from crutches for all distances    Time 8   Period Weeks   Status On-going   PT LONG TERM GOAL #5   Title demonstrate Lt knee AROM flexion to > or = to 110 degrees to improve mobility   Time 8   Period Weeks   Status On-going               Plan - 02/19/15 1820    Clinical Impression Statement Patient progressing with ACL protocol for 5 1/2 weeks.  She continues to have stiffness with knee flexion ROM as well as significant hip and knee muscle weakness secondary to months of decreased function.  She reports mild discomfort only and improved stability since surgery but is still fearful of full weight bearing.  Therapist closely monitoring for safety and patellofemoral alignment.     PT Next Visit Plan Send MD note next visit;  recheck ROM; 6 weeks post op next visit        Problem List Patient Active Problem List   Diagnosis Date Noted  . ACL (anterior cruciate ligament) tear 01/10/2015  . Finger pain 04/13/2012    [name] 02/19/2015, 6:26 PM  [facility] [address] [place], [place], [address] Phone: [phone]   Fax:  [phone]    Ruben Im, PT 02/19/2015 6:26 PM Phone: [phone] Fax: [phone]

## 2015-02-21 ENCOUNTER — Ambulatory Visit: Payer: 59 | Attending: Orthopaedic Surgery | Admitting: Physical Therapy

## 2015-02-21 DIAGNOSIS — M7989 Other specified soft tissue disorders: Secondary | ICD-10-CM | POA: Diagnosis present

## 2015-02-21 DIAGNOSIS — M25662 Stiffness of left knee, not elsewhere classified: Secondary | ICD-10-CM | POA: Insufficient documentation

## 2015-02-21 DIAGNOSIS — M6281 Muscle weakness (generalized): Secondary | ICD-10-CM | POA: Diagnosis present

## 2015-02-21 DIAGNOSIS — R262 Difficulty in walking, not elsewhere classified: Secondary | ICD-10-CM | POA: Diagnosis present

## 2015-02-21 DIAGNOSIS — S83512A Sprain of anterior cruciate ligament of left knee, initial encounter: Secondary | ICD-10-CM | POA: Diagnosis present

## 2015-02-21 NOTE — Therapy (Signed)
[place], [place], [address] Phone: [phone]   Fax:  [phone]  Physical Therapy Treatment  Patient Details  Name: Nicole Oliver MRN: [id number] Date of Birth: 11/30/1965 Referring Provider:  Gaynelle Arabian, MD  Encounter Date: 02/21/2015      PT End of Session - 02/21/15 1208    Visit Number 7   Number of Visits 21   Date for PT Re-Evaluation 03/27/15   Authorization Type [facility] UMR   PT Start Time 9798   PT Stop Time 9211   PT Time Calculation (min) 60 min   Activity Tolerance Patient tolerated treatment well      Past Medical History  Diagnosis Date  . Anxiety   . ACL tear 12/2014    left  . Dental crown present   . Hyperlipidemia     Past Surgical History  Procedure Laterality Date  . Nasal reconstruction with septal repair    . Total abdominal hysterectomy w/ bilateral salpingoophorectomy  06/25/2003  . Strabismus surgery    . Knee arthroscopy with anterior cruciate ligament (acl) repair Left 01/10/2015    Procedure: KNEE ARTHROSCOPY WITH ANTERIOR CRUCIATE LIGAMENT (ACL) RECONSTRUCTION WITH BONE-TENDON-BONE (PATELLAR TENDON) ALLOGRAFT.    ARTHROSCOPIC ACL RECONSTRUCTION WITH BONE-TENDON-BONE (PATELLAR TENDON) ALLOGRAFT, 5\' 5" , 170#.;  Surgeon: Garald Balding, MD;  Location: [place];  Service: Orthopedics;  Laterality: Left;  ARTHROSCOPIC ACL RECONSTRUCTION WITH BONE-TENDON-BONE (    There were no vitals filed for this visit.  Visit Diagnosis:  Joint stiffness of knee, left  Muscle weakness of lower extremity  Difficulty in walking  Swelling of limb  ACL tear, left, initial encounter      Subjective Assessment - 02/21/15 1147    Subjective Presents with on crutch today.  Takes pain medicine before therapy.  Sleeping on the couch with it elevated so has AM stiffness.  The couch is more comfortable right now.  I'm concerned about about my walking, that I won't  be able to walk normal.     Pertinent History ACL repair 01/11/15   Currently in Pain? Yes   Pain Score 3    Pain Location Knee   Pain Orientation Left   Pain Type Surgical pain   Pain Onset More than a month ago   Pain Frequency Intermittent   Aggravating Factors  prolonged walking and standing like the grocery store, it starts swelling/throbbing   Pain Relieving Factors cold, elevation, compression            [facility] PT Assessment - 02/21/15 1153    AROM   Left Knee Extension 2   Left Knee Flexion 120  supine   Strength   Left Hip Flexion 4+/5   Left Hip ABduction 4+/5   Left Knee Flexion 4-/5   Left Knee Extension 4-/5                     [facility] Adult PT Treatment/Exercise - 02/21/15 1151    Knee/Hip Exercises: Aerobic   Stationary Bike Bike full revolutions 5 min  closer seat   Knee/Hip Exercises: Standing   Heel Raises 20 reps   Lateral Step Up Left;15 reps;Hand Hold: 2;Step Height: 4"  parallel bars   Forward Step Up Left;1 set;15 reps;Hand Hold: 2;Step Height: 4"   Step Down Left;1 set;15 reps;Hand Hold: 2;Step Height: 2"  parallel bars   SLS with Vectors WB on left, right 4 ways 10x each   Other  Standing Knee Exercises parallel bars FW/BW, high step, side step 2-4 laps each with light UE touch   Vasopneumatic   Number Minutes Vasopneumatic  15 minutes   Vasopnuematic Location  Knee   Vasopneumatic Pressure Medium   Vasopneumatic Temperature  32                  PT Short Term Goals - 02/21/15 1233    PT SHORT TERM GOAL #1   Title be independent in initial HEP   Time 4   Period Weeks   Status Achieved   PT SHORT TERM GOAL #2   Title demonstrate Lt knee AROM flexion to 90 degrees to allow for sitting without substitution   Status Achieved   PT SHORT TERM GOAL #3   Title use 1 crutch for all ambulation wearing hinged brace   Status Achieved   PT SHORT TERM GOAL #4   Title Left knee extension improved to 6 degrees needed for greater  ease with ambulation for return to work.   Status Achieved           PT Long Term Goals - 02/21/15 1233    PT LONG TERM GOAL #1   Title be independent in advanced HEP   Time 8   Period Weeks   Status On-going   PT LONG TERM GOAL #2   Title reduce FOTO to < or = to 40% limitation   Time 8   Period Weeks   Status On-going   PT LONG TERM GOAL #3   Title demonstrate Lt quad strength to 4/5 to improve safety with ambulation   Time 8   Period Weeks   Status On-going   PT LONG TERM GOAL #4   Title wean from crutches for all distances    Time 8   Period Weeks   Status On-going   PT LONG TERM GOAL #5   Title demonstrate Lt knee AROM flexion to > or = to 110 degrees to improve mobility   Time 8   Period Weeks   Status On-going   PT LONG TERM GOAL #6   Title improve Lt knee strength to ascend and descend steps with step-over-step gait   Time 8   Period Weeks   Status On-going               Plan - 02/21/15 1210    Clinical Impression Statement Much improved knee AROM ( 0 in supine) 2-120;  min to no quad lag with SLRs.  Ambulating shorter community distances with 1 crutch vs. 2.  STGs met.  Cues for gait pattern with heel strike, step length.  Fewer cues for patellofemoral alignment.  Continues to be fearful of full weight bearing, worried about instability but no give-way noted with weight bearing exercises.     PT Next Visit Plan continue with progressive weight bearing, proprioceptive ex, increase knee flexion; quad strengthening        Problem List Patient Active Problem List   Diagnosis Date Noted  . ACL (anterior cruciate ligament) tear 01/10/2015  . Finger pain 04/13/2012    [name] 02/21/2015, 12:49 PM  [facility] [address] [place], [place], [address] Phone: [phone]   Fax:  [place], PT 02/21/2015 12:50 PM Phone: [phone] Fax: [phone]

## 2015-02-26 ENCOUNTER — Ambulatory Visit: Payer: 59 | Admitting: Physical Therapy

## 2015-02-26 DIAGNOSIS — R262 Difficulty in walking, not elsewhere classified: Secondary | ICD-10-CM

## 2015-02-26 DIAGNOSIS — S83512A Sprain of anterior cruciate ligament of left knee, initial encounter: Secondary | ICD-10-CM

## 2015-02-26 DIAGNOSIS — M25662 Stiffness of left knee, not elsewhere classified: Secondary | ICD-10-CM | POA: Diagnosis not present

## 2015-02-26 DIAGNOSIS — M7989 Other specified soft tissue disorders: Secondary | ICD-10-CM

## 2015-02-26 DIAGNOSIS — M6281 Muscle weakness (generalized): Secondary | ICD-10-CM

## 2015-02-26 NOTE — Therapy (Signed)
[place], [place], [address] Phone: [phone]   Fax:  [phone]  Physical Therapy Treatment  Patient Details  Name: Nicole Oliver MRN: [id number] Date of Birth: [birth year] Referring Provider:  Gaynelle Arabian, MD  Encounter Date: 02/26/2015      PT End of Session - 02/26/15 1235    Visit Number 8   Number of Visits 21   Date for PT Re-Evaluation 03/27/15   Authorization Type [facility]   PT Start Time 1140   PT Stop Time 2119   PT Time Calculation (min) 65 min   Activity Tolerance Patient tolerated treatment well      Past Medical History  Diagnosis Date  . Anxiety   . ACL tear 12/2014    left  . Dental crown present   . Hyperlipidemia     Past Surgical History  Procedure Laterality Date  . Nasal reconstruction with septal repair    . Total abdominal hysterectomy w/ bilateral salpingoophorectomy  06/25/2003  . Strabismus surgery    . Knee arthroscopy with anterior cruciate ligament (acl) repair Left 01/10/2015    Procedure: KNEE ARTHROSCOPY WITH ANTERIOR CRUCIATE LIGAMENT (ACL) RECONSTRUCTION WITH BONE-TENDON-BONE (PATELLAR TENDON) ALLOGRAFT.    ARTHROSCOPIC ACL RECONSTRUCTION WITH BONE-TENDON-BONE (PATELLAR TENDON) ALLOGRAFT, 5\' 5" , 170#.;  Surgeon: Garald Balding, MD;  Location: [place];  Service: Orthopedics;  Laterality: Left;  ARTHROSCOPIC ACL RECONSTRUCTION WITH BONE-TENDON-BONE (    There were no vitals filed for this visit.  Visit Diagnosis:  Joint stiffness of knee, left  Muscle weakness of lower extremity  Difficulty in walking  Swelling of limb  ACL tear, left, initial encounter      Subjective Assessment - 02/26/15 1159    Subjective States she saw the doctor and he was pleased with progress.  Patient states the doctor said to wean from the crutch before weaning from brace.  Sore after walking for 2 hours at [facility] yesterday.     Currently in Pain? Yes    Pain Score 2   soreness   Pain Location Knee   Pain Descriptors / Indicators Sore   Pain Type Surgical pain   Aggravating Factors  prolonged walking   Pain Relieving Factors cold elevation, compression                         [facility] Adult PT Treatment/Exercise - 02/26/15 1204    Ambulation/Gait   Ambulation/Gait Yes   Assistive device Straight cane   Gait Pattern Step-through pattern   Gait Comments 300 feet   Knee/Hip Exercises: Aerobic   Stationary Bike Bike close seat 10 min full revolutions   Knee/Hip Exercises: Standing   Heel Raises 20 reps  on rocker board   Lateral Step Up Left;15 reps;Hand Hold: 2;Step Height: 4"   Forward Step Up Left;1 set;15 reps;Hand Hold: 2;Step Height: 4"   Step Down Left;1 set;15 reps;Hand Hold: 2;Step Height: 2"   SLS with Vectors WB on left on blue foam 4 ways 6x    Other Standing Knee Exercises parallel bars FW/BW, high step, side step 2-4 laps each with light UE touch   Other Standing Knee Exercises weight shift on foam 4 ways without UE support   Cryotherapy   Number Minutes Cryotherapy 12 Minutes   Cryotherapy Location Knee   Type of Cryotherapy Ice pack  PT Short Term Goals - 02/26/15 1239    PT SHORT TERM GOAL #1   Title be independent in initial HEP   Status Achieved   PT SHORT TERM GOAL #2   Title demonstrate Lt knee AROM flexion to 90 degrees to allow for sitting without substitution   Status Achieved   PT SHORT TERM GOAL #3   Title use 1 crutch for all ambulation wearing hinged brace   Status Achieved   PT SHORT TERM GOAL #4   Title Left knee extension improved to 6 degrees needed for greater ease with ambulation for return to work.   Status Achieved           PT Long Term Goals - 02/26/15 1239    PT LONG TERM GOAL #1   Title be independent in advanced HEP   Time 8   Period Weeks   Status On-going   PT LONG TERM GOAL #2   Title reduce FOTO to < or = to 40% limitation    Time 8   Period Weeks   Status On-going   PT LONG TERM GOAL #3   Title demonstrate Lt quad strength to 4/5 to improve safety with ambulation   Time 8   Period Weeks   Status On-going   PT LONG TERM GOAL #4   Title wean from crutches for all distances    Time 8   Period Weeks   Status On-going   PT LONG TERM GOAL #5   Title demonstrate Lt knee AROM flexion to > or = to 110 degrees to improve mobility   Time 8   Period Weeks   Status On-going   PT LONG TERM GOAL #6   Title improve Lt knee strength to ascend and descend steps with step-over-step gait   Time 8   Period Weeks   Status On-going               Plan - 02/26/15 1236    Clinical Impression Statement Improved ROM and strength although gluteal weakness noted with pelvic drop with gait in parallel bars (no UE assist).  Difficulty with eccentric step downs (quad muscle weakness). Transitioning to a single point cane may help to wean from crutch and off assistive device completely.    Patient denies pain, primary just soreness and weakness.  No signs of instability.  Therapist closely monitoring gait and standing exercises for safety.    PT Next Visit Plan continue with progressive weight bearing, proprioceptive ex, increase knee flexion; quad strengthening; gluteal strengthening        Problem List Patient Active Problem List   Diagnosis Date Noted  . ACL (anterior cruciate ligament) tear 01/10/2015  . Finger pain 04/13/2012    [name] 02/26/2015, 12:47 PM  [facility] [address] [place], [place], [address] Phone: [phone]   Fax:  [phone]   Ruben Im, PT 02/26/2015 12:49 PM Phone: [phone] Fax: [phone]

## 2015-02-28 ENCOUNTER — Ambulatory Visit: Payer: 59 | Admitting: Physical Therapy

## 2015-02-28 DIAGNOSIS — M7989 Other specified soft tissue disorders: Secondary | ICD-10-CM

## 2015-02-28 DIAGNOSIS — M6281 Muscle weakness (generalized): Secondary | ICD-10-CM

## 2015-02-28 DIAGNOSIS — R262 Difficulty in walking, not elsewhere classified: Secondary | ICD-10-CM

## 2015-02-28 DIAGNOSIS — M25662 Stiffness of left knee, not elsewhere classified: Secondary | ICD-10-CM | POA: Diagnosis not present

## 2015-02-28 NOTE — Therapy (Signed)
[place], [place], [address] Phone: [phone]   Fax:  [phone]  Physical Therapy Treatment  Patient Details  Name: Nicole Oliver MRN: [id number] Date of Birth: March 18, 1966 Referring Provider:  Gaynelle Arabian, MD  Encounter Date: 02/28/2015      PT End of Session - 02/28/15 1220    Visit Number 9   Number of Visits 21   Date for PT Re-Evaluation 03/27/15   PT Start Time 9767   PT Stop Time 1115   PT Time Calculation (min) 57 min   Activity Tolerance Patient tolerated treatment well;No increased pain   Behavior During Therapy [facility] for tasks assessed/performed      Past Medical History  Diagnosis Date  . Anxiety   . ACL tear 12/2014    left  . Dental crown present   . Hyperlipidemia     Past Surgical History  Procedure Laterality Date  . Nasal reconstruction with septal repair    . Total abdominal hysterectomy w/ bilateral salpingoophorectomy  06/25/2003  . Strabismus surgery    . Knee arthroscopy with anterior cruciate ligament (acl) repair Left 01/10/2015    Procedure: KNEE ARTHROSCOPY WITH ANTERIOR CRUCIATE LIGAMENT (ACL) RECONSTRUCTION WITH BONE-TENDON-BONE (PATELLAR TENDON) ALLOGRAFT.    ARTHROSCOPIC ACL RECONSTRUCTION WITH BONE-TENDON-BONE (PATELLAR TENDON) ALLOGRAFT, 5\' 5" , 170#.;  Surgeon: Garald Balding, MD;  Location: [place];  Service: Orthopedics;  Laterality: Left;  ARTHROSCOPIC ACL RECONSTRUCTION WITH BONE-TENDON-BONE (    There were no vitals filed for this visit.  Visit Diagnosis:  Joint stiffness of knee, left  Muscle weakness of lower extremity  Difficulty in walking  Swelling of limb      Subjective Assessment - 02/28/15 1021    Subjective No pain.  Doing her exercises. Has some swelling, but not a whole lot.  Steps at home 1 at a time with 1 crutch.  1 crutch only now.     Currently in Pain? No/denies   Multiple Pain Sites No  Sometimes Lt hip  feels sore.  1/10                         [facility] Adult PT Treatment/Exercise - 02/28/15 1026    Ambulation/Gait   Gait Comments 4 inch steps with rail step over step 16 steps with cues.   Knee/Hip Exercises: Aerobic   Stationary Bike Bike 10 minutes Level 1 close for flexion.   Knee/Hip Exercises: Standing   Wall Squat Limitations 10 mini squat 10 reps cued    SLS with Vectors Standing LT with 3 way leg moves 10 X each  unablr to do without light hand hold, SBA for safety.   Knee/Hip Exercises: Seated   Long Arc Quad 10 reps  10 second holds with eccentric lowering   Heel Slides Limitations 120 degrees AROM Lt  tight. 10 X strap   Knee/Hip Exercises: Supine   Bridges 10 reps;2 sets  5 second holds.   Cryotherapy   Number Minutes Cryotherapy 10 Minutes   Cryotherapy Location Knee   Type of Cryotherapy --  cold pack                PT Education - 02/28/15 1220    Education provided Yes   Education Details gait training on steps   Person(s) Educated Patient   Methods Explanation;Demonstration;Verbal cues   Comprehension Verbalized understanding;Need further instruction          PT  Short Term Goals - 02/26/15 1239    PT SHORT TERM GOAL #1   Title be independent in initial HEP   Status Achieved   PT SHORT TERM GOAL #2   Title demonstrate Lt knee AROM flexion to 90 degrees to allow for sitting without substitution   Status Achieved   PT SHORT TERM GOAL #3   Title use 1 crutch for all ambulation wearing hinged brace   Status Achieved   PT SHORT TERM GOAL #4   Title Left knee extension improved to 6 degrees needed for greater ease with ambulation for return to work.   Status Achieved           PT Long Term Goals - 02/28/15 1222    PT LONG TERM GOAL #1   Title be independent in advanced HEP   Time 8   Period Weeks   Status On-going   PT LONG TERM GOAL #2   Title reduce FOTO to < or = to 40% limitation   Time 8   Period Weeks   Status  Unable to assess   PT LONG TERM GOAL #3   Title demonstrate Lt quad strength to 4/5 to improve safety with ambulation   Time 8   Period Weeks   Status Unable to assess   PT LONG TERM GOAL #4   Title wean from crutches for all distances    Baseline 1 crutch    Time 8   Period Weeks   Status On-going   PT LONG TERM GOAL #5   Title demonstrate Lt knee AROM flexion to > or = to 110 degrees to improve mobility   Baseline 120 degrees   Time 8   Period Weeks   Status Achieved   PT LONG TERM GOAL #6   Title improve Lt knee strength to ascend and descend steps with step-over-step gait   Time 8   Period Weeks   Status On-going               Plan - 02/28/15 1224    PT Next Visit Plan continue with progressive weight bearing, proprioceptive ex, increase knee flexion; quad strengthening; gluteal strengthening   FOTO NEXT  FOTO  NEXT   Consulted and Agree with Plan of Care Patient        Problem List Patient Active Problem List   Diagnosis Date Noted  . ACL (anterior cruciate ligament) tear 01/10/2015  . Finger pain 04/13/2012    HARRIS,KAREN 02/28/2015, 12:25 PM  [facility] [address] [place], [place], [address] Phone: [phone]   Fax:  [phone]

## 2015-03-05 ENCOUNTER — Ambulatory Visit: Payer: 59 | Admitting: Physical Therapy

## 2015-03-05 DIAGNOSIS — R262 Difficulty in walking, not elsewhere classified: Secondary | ICD-10-CM

## 2015-03-05 DIAGNOSIS — M25662 Stiffness of left knee, not elsewhere classified: Secondary | ICD-10-CM | POA: Diagnosis not present

## 2015-03-05 DIAGNOSIS — M7989 Other specified soft tissue disorders: Secondary | ICD-10-CM

## 2015-03-05 DIAGNOSIS — M6281 Muscle weakness (generalized): Secondary | ICD-10-CM

## 2015-03-05 NOTE — Therapy (Signed)
[place], [place], [address] Phone: [phone]   Fax:  [phone]  Physical Therapy Treatment  Patient Details  Name: Nicole Oliver MRN: [id number] Date of Birth: [birth year] Referring Provider:  Gaynelle Arabian, MD  Encounter Date: 03/05/2015      PT End of Session - 03/05/15 1314    Visit Number 10   Number of Visits 21   Date for PT Re-Evaluation 03/27/15   PT Start Time 1761   PT Stop Time 6073   PT Time Calculation (min) 60 min   Activity Tolerance Patient tolerated treatment well;No increased pain   Behavior During Therapy [facility] for tasks assessed/performed      Past Medical History  Diagnosis Date  . Anxiety   . ACL tear 12/2014    left  . Dental crown present   . Hyperlipidemia     Past Surgical History  Procedure Laterality Date  . Nasal reconstruction with septal repair    . Total abdominal hysterectomy w/ bilateral salpingoophorectomy  06/25/2003  . Strabismus surgery    . Knee arthroscopy with anterior cruciate ligament (acl) repair Left 01/10/2015    Procedure: KNEE ARTHROSCOPY WITH ANTERIOR CRUCIATE LIGAMENT (ACL) RECONSTRUCTION WITH BONE-TENDON-BONE (PATELLAR TENDON) ALLOGRAFT.    ARTHROSCOPIC ACL RECONSTRUCTION WITH BONE-TENDON-BONE (PATELLAR TENDON) ALLOGRAFT, _0 , 170#.;  Surgeon: Garald Balding, MD;  Location: [place];  Service: Orthopedics;  Laterality: Left;  ARTHROSCOPIC ACL RECONSTRUCTION WITH BONE-TENDON-BONE (    There were no vitals filed for this visit.  Visit Diagnosis:  Muscle weakness of lower extremity  Difficulty in walking  Swelling of limb      Subjective Assessment - 03/05/15 1151    Subjective 2/10 now,  AM pain daily, stiffness in am. Had sharp shooting pain every 20 minutes shoot up knee lasting 2-3 hours then eased off with pain medication                         [facility] Adult PT Treatment/Exercise - 03/05/15  1159    Ambulation/Gait   Ambulation/Gait Yes   Stairs Yes   Stairs Assistance 6: Modified independent (Device/Increase time)   Stair Management Technique One rail Left;Alternating pattern;Step to pattern;Forwards   Number of Stairs --  16 steps (4 X4)  Rail and 1 crutch.    Gait Comments 4 inch ascending, step 2    Knee/Hip Exercises: Aerobic   Stationary Bike Bike about 5 minutes   Knee/Hip Exercises: Machines for Strengthening   Cybex Knee Flexion 30 LBS, 1, 2 legs, eccentric.   Cybex Leg Press 20 LBS  unable to use LT leg only yet.  Concentric and eccentric 30+   Knee/Hip Exercises: Supine   Quad Sets 10 reps   Straight Leg Raises Left;1 set;2 sets   Straight Leg Raises Limitations 2 LBS, 4 LBS   Vasopneumatic   Number Minutes Vasopneumatic  15 minutes   Vasopnuematic Location  Knee   Vasopneumatic Pressure Medium   Vasopneumatic Temperature  32                  PT Short Term Goals - 02/26/15 1239    PT SHORT TERM GOAL #1   Title be independent in initial HEP   Status Achieved   PT SHORT TERM GOAL #2   Title demonstrate Lt knee AROM flexion to 90 degrees to allow for sitting without substitution   Status Achieved   PT SHORT  TERM GOAL #3   Title use 1 crutch for all ambulation wearing hinged brace   Status Achieved   PT SHORT TERM GOAL #4   Title Left knee extension improved to 6 degrees needed for greater ease with ambulation for return to work.   Status Achieved           PT Long Term Goals - 03/05/15 1325    PT LONG TERM GOAL #1   Title be independent in advanced HEP   Time 8   Period Weeks   Status On-going   PT LONG TERM GOAL #2   Title reduce FOTO to < or = to 40% limitation   Baseline 55% limitation   Time 8   Period Weeks   Status Partially Met   PT LONG TERM GOAL #3   Title demonstrate Lt quad strength to 4/5 to improve safety with ambulation   Time 8   Period Weeks   Status Unable to assess   PT LONG TERM GOAL #4   Title wean from  crutches for all distances    Baseline 1 crutch    Time 8   Period Weeks   Status On-going   PT LONG TERM GOAL #5   Title demonstrate Lt knee AROM flexion to > or = to 110 degrees to improve mobility   Status Achieved               Plan - 03/05/15 1315    Clinical Impression Statement Progress with gait, able to ascend steps step over step on 6 inch steps (Improved from 4 inch).  Able to add weights to SLR.  gait and strength focus.  Patient continues to be inquisitive and asks many questions.  FOTO 61 % limitation improved to 55% limitation  at 10th visit.   PT Next Visit Plan continue with progressive weight bearing, proprioceptive ex, increase knee flexion; quad strengthening; gluteal strengthening     PT Home Exercise Plan continue   Consulted and Agree with Plan of Care Patient        Problem List Patient Active Problem List   Diagnosis Date Noted  . ACL (anterior cruciate ligament) tear 01/10/2015  . Finger pain 04/13/2012    HARRIS,KAREN 03/05/2015, 1:27 PM  [facility] [address] [place], [place], [address] Phone: [phone]   Fax:  [phone]     Melvenia Needles, PTA 03/05/2015 1:27 PM Phone: [phone] Fax: [phone]

## 2015-03-07 ENCOUNTER — Ambulatory Visit: Payer: 59 | Admitting: Physical Therapy

## 2015-03-07 DIAGNOSIS — M25662 Stiffness of left knee, not elsewhere classified: Secondary | ICD-10-CM | POA: Diagnosis not present

## 2015-03-07 DIAGNOSIS — S83512A Sprain of anterior cruciate ligament of left knee, initial encounter: Secondary | ICD-10-CM

## 2015-03-07 DIAGNOSIS — R262 Difficulty in walking, not elsewhere classified: Secondary | ICD-10-CM

## 2015-03-07 DIAGNOSIS — M6281 Muscle weakness (generalized): Secondary | ICD-10-CM

## 2015-03-07 DIAGNOSIS — M7989 Other specified soft tissue disorders: Secondary | ICD-10-CM

## 2015-03-07 NOTE — Therapy (Signed)
[place], [place], [address] Phone: [phone]   Fax:  [phone]  Physical Therapy Treatment  Patient Details  Name: Nicole Oliver MRN: [id number] Date of Birth: Aug 26, 1965 Referring Provider:  Gaynelle Arabian, MD  Encounter Date: 03/07/2015      PT End of Session - 03/07/15 1146    Visit Number 11   Number of Visits 21   Date for PT Re-Evaluation 03/27/15   Authorization Type [facility]   PT Start Time 1143   PT Stop Time 1245   PT Time Calculation (min) 62 min      Past Medical History  Diagnosis Date  . Anxiety   . ACL tear 12/2014    left  . Dental crown present   . Hyperlipidemia     Past Surgical History  Procedure Laterality Date  . Nasal reconstruction with septal repair    . Total abdominal hysterectomy w/ bilateral salpingoophorectomy  06/25/2003  . Strabismus surgery    . Knee arthroscopy with anterior cruciate ligament (acl) repair Left 01/10/2015    Procedure: KNEE ARTHROSCOPY WITH ANTERIOR CRUCIATE LIGAMENT (ACL) RECONSTRUCTION WITH BONE-TENDON-BONE (PATELLAR TENDON) ALLOGRAFT.    ARTHROSCOPIC ACL RECONSTRUCTION WITH BONE-TENDON-BONE (PATELLAR TENDON) ALLOGRAFT, '5\' 5"' , 170#.;  Surgeon: Garald Balding, MD;  Location: [place];  Service: Orthopedics;  Laterality: Left;  ARTHROSCOPIC ACL RECONSTRUCTION WITH BONE-TENDON-BONE (    There were no vitals filed for this visit.  Visit Diagnosis:  Muscle weakness of lower extremity  Difficulty in walking  Swelling of limb  Joint stiffness of knee, left  ACL tear, left, initial encounter      Subjective Assessment - 03/07/15 1144    Subjective A low level ache. It hurts more in the evening after I have been up on it. Still have electrical feelings.    Currently in Pain? Yes   Pain Score 2    Pain Location Knee   Pain Orientation Left   Aggravating Factors  up  on it all day   Pain Relieving Factors cold  elevation                         [facility] Adult PT Treatment/Exercise - 03/07/15 0001    Ambulation/Gait   Ambulation/Gait Yes   Ambulation/Gait Assistance 6: Modified independent (Device/Increase time)   Ambulation Distance (Feet) 300 Feet   Assistive device Straight cane   Gait Pattern Step-through pattern   Ambulation Surface Level;Indoor   Stairs --   Knee/Hip Exercises: Standing   SLS with Vectors Standing LT with 3 way leg moves 10 X each without UE support and cues for full left WB and using core   Other Standing Knee Exercises sit-stand 5 with LLE back   Knee/Hip Exercises: Seated   Long Arc Quad Left;15 reps  5 sec hold   Long Arc Quad Weight 2 lbs.  up to 3   Knee/Hip Exercises: Supine   Straight Leg Raises Left;15 reps   Straight Leg Raises Limitations 3   Knee/Hip Exercises: Sidelying   Hip ABduction Left;15 reps   Hip ABduction Limitations 3#   Knee/Hip Exercises: Prone   Hip Extension Left;15 reps   Hip Extension Limitations 3#   Vasopneumatic   Number Minutes Vasopneumatic  15 minutes   Vasopnuematic Location  Knee   Vasopneumatic Pressure Medium   Vasopneumatic Temperature  32  PT Short Term Goals - 02/26/15 1239    PT SHORT TERM GOAL #1   Title be independent in initial HEP   Status Achieved   PT SHORT TERM GOAL #2   Title demonstrate Lt knee AROM flexion to 90 degrees to allow for sitting without substitution   Status Achieved   PT SHORT TERM GOAL #3   Title use 1 crutch for all ambulation wearing hinged brace   Status Achieved   PT SHORT TERM GOAL #4   Title Left knee extension improved to 6 degrees needed for greater ease with ambulation for return to work.   Status Achieved           PT Long Term Goals - 03/05/15 1325    PT LONG TERM GOAL #1   Title be independent in advanced HEP   Time 8   Period Weeks   Status On-going   PT LONG TERM GOAL #2   Title reduce FOTO to < or = to 40% limitation    Baseline 55% limitation   Time 8   Period Weeks   Status Partially Met   PT LONG TERM GOAL #3   Title demonstrate Lt quad strength to 4/5 to improve safety with ambulation   Time 8   Period Weeks   Status Unable to assess   PT LONG TERM GOAL #4   Title wean from crutches for all distances    Baseline 1 crutch    Time 8   Period Weeks   Status On-going   PT LONG TERM GOAL #5   Title demonstrate Lt knee AROM flexion to > or = to 110 degrees to improve mobility   Status Achieved               Plan - 03/07/15 1224    Clinical Impression Statement Focused today on gait with SPC, pt has one on the way from a friend to borrow. She continues to wear brace all the time except for sleeping. Encouraged her to try going without brace inside home to increase weight bearing comfort. Encouraged her to get [facility] as soon as possible also to progress weight bearing tolerance. SLS on left focus today with decreasing need for UE support and using core. Pt with difficulty.    PT Next Visit Plan continue with progressive weight bearing, proprioceptive ex, increase knee flexion; quad strengthening; gluteal strengthening - give an hep for core?        Problem List Patient Active Problem List   Diagnosis Date Noted  . ACL (anterior cruciate ligament) tear 01/10/2015  . Finger pain 04/13/2012    Dorene Ar, PTA 03/07/2015, 12:41 PM  [facility] [address] [place], [place], [address] Phone: [phone]   Fax:  [phone]

## 2015-03-12 ENCOUNTER — Ambulatory Visit: Payer: 59 | Admitting: Physical Therapy

## 2015-03-12 DIAGNOSIS — M7989 Other specified soft tissue disorders: Secondary | ICD-10-CM

## 2015-03-12 DIAGNOSIS — M6281 Muscle weakness (generalized): Secondary | ICD-10-CM

## 2015-03-12 DIAGNOSIS — M25662 Stiffness of left knee, not elsewhere classified: Secondary | ICD-10-CM | POA: Diagnosis not present

## 2015-03-12 DIAGNOSIS — R262 Difficulty in walking, not elsewhere classified: Secondary | ICD-10-CM

## 2015-03-12 NOTE — Therapy (Signed)
[place], [place], [address] Phone: [phone]   Fax:  [phone]  Physical Therapy Treatment  Patient Details  Name: Nicole Oliver MRN: [id number] Date of Birth: [birth year] Referring Provider:  Gaynelle Arabian, MD  Encounter Date: 03/12/2015      PT End of Session - 03/12/15 1301    Visit Number 12   Number of Visits 21   Date for PT Re-Evaluation 03/27/15   PT Start Time 1150   PT Stop Time 1250   PT Time Calculation (min) 60 min   Activity Tolerance Patient tolerated treatment well   Behavior During Therapy [facility] for tasks assessed/performed      Past Medical History  Diagnosis Date  . Anxiety   . ACL tear 12/2014    left  . Dental crown present   . Hyperlipidemia     Past Surgical History  Procedure Laterality Date  . Nasal reconstruction with septal repair    . Total abdominal hysterectomy w/ bilateral salpingoophorectomy  06/25/2003  . Strabismus surgery    . Knee arthroscopy with anterior cruciate ligament (acl) repair Left 01/10/2015    Procedure: KNEE ARTHROSCOPY WITH ANTERIOR CRUCIATE LIGAMENT (ACL) RECONSTRUCTION WITH BONE-TENDON-BONE (PATELLAR TENDON) ALLOGRAFT.    ARTHROSCOPIC ACL RECONSTRUCTION WITH BONE-TENDON-BONE (PATELLAR TENDON) ALLOGRAFT, 5\' 5" , 170#.;  Surgeon: Garald Balding, MD;  Location: [place];  Service: Orthopedics;  Laterality: Left;  ARTHROSCOPIC ACL RECONSTRUCTION WITH BONE-TENDON-BONE (    There were no vitals filed for this visit.  Visit Diagnosis:  Muscle weakness of lower extremity  Difficulty in walking  Swelling of limb  Joint stiffness of knee, left      Subjective Assessment - 03/12/15 1200    Subjective (p) Stopped using brace last Friday due to hip pain.  Hip pain much improved and she feels she is able to use her muscles better.     Currently in Pain? (p) Yes   Pain Score (p) 3    Pain Location (p) Knee   Pain Orientation  (p) Left   Pain Descriptors / Indicators (p) Aching   Pain Radiating Towards (p) whole knee   Pain Frequency (p) Constant   Aggravating Factors  (p) more time on feet,  stiffness with sitting too long.     Pain Relieving Factors (p) cold elevation, no brace.                         [place] Adult PT Treatment/Exercise - 03/12/15 1150    Ambulation/Gait   Ambulation/Gait Yes   Assistive device Straight cane   Stairs Yes   Stairs Assistance --  CUES ONLY   Stair Management Technique One rail Right;Alternating pattern;With cane   Number of Stairs 16  4 INCH STEPS AND 6 [facility] STEPS   Knee/Hip Exercises: Standing   Step Down Left;1 set;10 reps;Hand Hold: 2   Step Down Limitations LOWERING ONLY, ECCENTRIC   Wall Squat 10 reps   Knee/Hip Exercises: Seated   Long Arc Quad Strengthening;Left;1 set;10 reps;Weights   Long Arc Quad Limitations 8 lbs, 10 SECOND HOLD, eccentric lowering   Knee/Hip Exercises: Supine   Quad Sets 10 reps   Terminal Knee Extension AROM;Strengthening;Both;3 sets   Terminal Knee Extension Limitations prone equal ROM with these.   Vasopneumatic   Number Minutes Vasopneumatic  15 minutes   Vasopnuematic Location  Knee   Vasopneumatic Pressure Medium   Vasopneumatic Temperature  32  Ankle Exercises: Seated   Heel Raises 10 reps  LT SINGLE   Ankle Exercises: Machines for Strengthening   Cybex Leg Press --  kNEE EXTENSION 2 LEGS ,2 PLATES ECENTRIC, UNABLE 1 LEG , 1 P                PT Education - 03/12/15 1301    Education provided Yes   Education Details gait on level and steps   Person(s) Educated Patient   Methods Explanation   Comprehension Verbalized understanding          PT Short Term Goals - 02/26/15 1239    PT SHORT TERM GOAL #1   Title be independent in initial HEP   Status Achieved   PT SHORT TERM GOAL #2   Title demonstrate Lt knee AROM flexion to 90 degrees to allow for sitting without substitution   Status  Achieved   PT SHORT TERM GOAL #3   Title use 1 crutch for all ambulation wearing hinged brace   Status Achieved   PT SHORT TERM GOAL #4   Title Left knee extension improved to 6 degrees needed for greater ease with ambulation for return to work.   Status Achieved           PT Long Term Goals - 03/12/15 1309    PT LONG TERM GOAL #1   Status On-going   PT LONG TERM GOAL #2   Title reduce FOTO to < or = to 40% limitation   Status On-going   PT LONG TERM GOAL #3   Title demonstrate Lt quad strength to 4/5 to improve safety with ambulation   Baseline 4+/5 mmt   Time 8   Period Weeks   Status Achieved   PT LONG TERM GOAL #4   Title wean from crutches for all distances    Baseline 1 crutch   Time 8   Period Weeks   Status On-going   PT LONG TERM GOAL #5   Title demonstrate Lt knee AROM flexion to > or = to 110 degrees to improve mobility   Status Achieved   PT LONG TERM GOAL #6   Title improve Lt knee strength to ascend and descend steps with step-over-step gait   Baseline needs hands   Time 8   Period Weeks   Status On-going               Plan - 03/12/15 1302    Clinical Impression Statement patient weaned herself from her brace.  Her challanges include eccentric quads and hamstring strength.  4/5  Hamstring, 4+/5 MMT Quads.  Endurance decreased.   PT Next Visit Plan continue with progressive weight bearing, proprioceptive ex, increase knee flexion; quad strengthening; gluteal strengthening - give an hep for core?   PT Home Exercise Plan eccentric work too   Consulted and Agree with Plan of Care Patient        Problem List Patient Active Problem List   Diagnosis Date Noted  . ACL (anterior cruciate ligament) tear 01/10/2015  . Finger pain 04/13/2012    HARRIS,KAREN 03/12/2015, 1:12 PM  [facility] [address] [place], [place], [address] Phone: [phone]   Fax:  [phone]     Melvenia Needles,  PTA 03/12/2015 1:12 PM Phone: [phone] Fax: [phone]

## 2015-03-14 ENCOUNTER — Ambulatory Visit: Payer: 59 | Admitting: Physical Therapy

## 2015-03-14 DIAGNOSIS — R262 Difficulty in walking, not elsewhere classified: Secondary | ICD-10-CM

## 2015-03-14 DIAGNOSIS — M25662 Stiffness of left knee, not elsewhere classified: Secondary | ICD-10-CM

## 2015-03-14 DIAGNOSIS — M7989 Other specified soft tissue disorders: Secondary | ICD-10-CM

## 2015-03-14 DIAGNOSIS — S83512A Sprain of anterior cruciate ligament of left knee, initial encounter: Secondary | ICD-10-CM

## 2015-03-14 DIAGNOSIS — M6281 Muscle weakness (generalized): Secondary | ICD-10-CM

## 2015-03-14 NOTE — Therapy (Signed)
[place], [place], [address] Phone: [phone]   Fax:  [phone]  Physical Therapy Treatment  Patient Details  Name: Nicole Oliver MRN: [id number] Date of Birth: 04-26-1966 Referring Provider:  Gaynelle Arabian, MD  Encounter Date: 03/14/2015      PT End of Session - 03/14/15 1234    Visit Number 13   Number of Visits 21   Date for PT Re-Evaluation 03/27/15   Authorization Type [facility] [employer]   PT Start Time 7867   PT Stop Time 1240   PT Time Calculation (min) 55 min   Activity Tolerance Patient tolerated treatment well      Past Medical History  Diagnosis Date  . Anxiety   . ACL tear 12/2014    left  . Dental crown present   . Hyperlipidemia     Past Surgical History  Procedure Laterality Date  . Nasal reconstruction with septal repair    . Total abdominal hysterectomy w/ bilateral salpingoophorectomy  06/25/2003  . Strabismus surgery    . Knee arthroscopy with anterior cruciate ligament (acl) repair Left 01/10/2015    Procedure: KNEE ARTHROSCOPY WITH ANTERIOR CRUCIATE LIGAMENT (ACL) RECONSTRUCTION WITH BONE-TENDON-BONE (PATELLAR TENDON) ALLOGRAFT.    ARTHROSCOPIC ACL RECONSTRUCTION WITH BONE-TENDON-BONE (PATELLAR TENDON) ALLOGRAFT, 5\' 5" , 170#.;  Surgeon: Garald Balding, MD;  Location: [place];  Service: Orthopedics;  Laterality: Left;  ARTHROSCOPIC ACL RECONSTRUCTION WITH BONE-TENDON-BONE (    There were no vitals filed for this visit.  Visit Diagnosis:  Muscle weakness of lower extremity  Difficulty in walking  Swelling of limb  Joint stiffness of knee, left  ACL tear, left, initial encounter      Subjective Assessment - 03/14/15 1148    Subjective Presents with single point cane instead of crutch.  No longer using long leg brace, just using small Neoprene brace.  It always feels worse when it is raining.  The steps with the cane "is not going too good."     Currently in Pain? Yes   Pain Score 4    Pain Location Knee   Pain Orientation Left   Aggravating Factors  prolonged walking   Pain Relieving Factors Neoprene support brace            [facility] PT Assessment - 03/14/15 1153    AROM   Left Knee Extension 0   Left Knee Flexion 129                     [facility] Adult PT Treatment/Exercise - 03/14/15 1152    Knee/Hip Exercises: Aerobic   Stationary Bike Bike with seat all the way forward 5 min   Knee/Hip Exercises: Standing   SLS on left with right UE diagonals red band 12x   SLS with Vectors Standing on left on blue dynamic surfaces 3 ways 10x   Walking with Sports Cord 5x FW, BW and side step R/L plate B   Gait Training with cane reciprocal up 6inch steps, down 4 inch steps   Other Standing Knee Exercises parallel bars FW/BW, high step, side step 2-4 laps each with light UE touch   Other Standing Knee Exercises Side step with and without red band in crounch position 4x 8 feet   Cryotherapy   Number Minutes Cryotherapy 10 Minutes   Cryotherapy Location Knee   Type of Cryotherapy Ice pack  PT Short Term Goals - 03/14/15 1237    PT SHORT TERM GOAL #1   Title be independent in initial HEP   Status Achieved   PT SHORT TERM GOAL #2   Title demonstrate Lt knee AROM flexion to 90 degrees to allow for sitting without substitution   Status Achieved   PT SHORT TERM GOAL #3   Title use 1 crutch for all ambulation wearing hinged brace   Status Achieved   PT SHORT TERM GOAL #4   Title Left knee extension improved to 6 degrees needed for greater ease with ambulation for return to work.   Status Achieved           PT Long Term Goals - 03/14/15 1237    PT LONG TERM GOAL #1   Title be independent in advanced HEP   Time 8   Period Weeks   Status On-going   PT LONG TERM GOAL #2   Title reduce FOTO to < or = to 40% limitation   Time 8   Period Weeks   Status On-going   PT LONG TERM GOAL #3    Title demonstrate Lt quad strength to 4/5 to improve safety with ambulation   Status Achieved   PT LONG TERM GOAL #4   Title wean from crutches for all distances    Time 8   Period Weeks   Status On-going   PT LONG TERM GOAL #5   Title demonstrate Lt knee AROM flexion to > or = to 110 degrees to improve mobility   Status Achieved   PT LONG TERM GOAL #6   Title improve Lt knee strength to ascend and descend steps with step-over-step gait   Time 8   Period Weeks   Status On-going               Plan - 03/14/15 1234    Clinical Impression Statement Patient improving with weight bearing and closed chain strengthening.  She continues to have inferior knee puffiness with reports of patellar tendon region pain with increased use of quadriceps especially now that she no longer uses brace or crutch.  If the pain continues, strapping tape may help across patellar tendon.  Good improvement of knee AROM 0-129 degrees.  Therapist closely monitoring for pain and safety.  No signs of knee instability.     PT Next Visit Plan continue with progressive weight bearing, proprioceptive ex, increase knee flexion; quad strengthening; gluteal strengthening - give an hep for core?        Problem List Patient Active Problem List   Diagnosis Date Noted  . ACL (anterior cruciate ligament) tear 01/10/2015  . Finger pain 04/13/2012    [name] 03/14/2015, 12:41 PM  [facility] [address] [place], [place], [address] Phone: [phone]   Fax:  [phone]   Ruben Im, PT 03/14/2015 12:42 PM Phone: [phone] Fax: [phone]

## 2015-03-19 ENCOUNTER — Ambulatory Visit: Payer: 59 | Admitting: Physical Therapy

## 2015-03-19 DIAGNOSIS — M25662 Stiffness of left knee, not elsewhere classified: Secondary | ICD-10-CM | POA: Diagnosis not present

## 2015-03-19 DIAGNOSIS — M7989 Other specified soft tissue disorders: Secondary | ICD-10-CM

## 2015-03-19 DIAGNOSIS — R262 Difficulty in walking, not elsewhere classified: Secondary | ICD-10-CM

## 2015-03-19 DIAGNOSIS — M6281 Muscle weakness (generalized): Secondary | ICD-10-CM

## 2015-03-19 NOTE — Patient Instructions (Addendum)
Sleeping on Back  Place pillow under knees. A pillow with cervical support and a roll around waist are also helpful. Copyright  VHI. All rights reserved.  Sleeping on Side Place pillow between knees. Use cervical support under neck and a roll around waist as needed. Copyright  VHI. All rights reserved.   Sleeping on Stomach   If this is the only desirable sleeping position, place pillow under lower legs, and under stomach or chest as needed.  Posture - Sitting   Sit upright, head facing forward. Try using a roll to support lower back. Keep shoulders relaxed, and avoid rounded back. Keep hips level with knees. Avoid crossing legs for long periods. Stand to Sit / Sit to Stand   To sit: Bend knees to lower self onto front edge of chair, then scoot back on seat. To stand: Reverse sequence by placing one foot forward, and scoot to front of seat. Use rocking motion to stand up.   Work Height and Reach  Ideal work height is no more than 2 to 4 inches below elbow level when standing, and at elbow level when sitting. Reaching should be limited to arm's length, with elbows slightly bent.  Bending  Bend at hips and knees, not back. Keep feet shoulder-width apart.    Posture - Standing   Good posture is important. Avoid slouching and forward head thrust. Maintain curve in low back and align ears over shoul- ders, hips over ankles.  Alternating Positions   Alternate tasks and change positions frequently to reduce fatigue and muscle tension. Take rest breaks. Computer Work   Position work to face forward. Use proper work and seat height. Keep shoulders back and down, wrists straight, and elbows at right angles. Use chair that provides full back support. Add footrest and lumbar roll as needed.  Getting Into / Out of Car  Lower self onto seat, scoot back, then bring in one leg at a time. Reverse sequence to get out.  Dressing  Lie on back to pull socks or slacks over feet, or sit  and bend leg while keeping back straight.    Housework - Sink  Place one foot on ledge of cabinet under sink when standing at sink for prolonged periods.   Pushing / Pulling  Pushing is preferable to pulling. Keep back in proper alignment, and use leg muscles to do the work.  Deep Squat   Squat and lift with both arms held against upper trunk. Tighten stomach muscles without holding breath. Use smooth movements to avoid jerking.  Avoid Twisting   Avoid twisting or bending back. Pivot around using foot movements, and bend at knees if needed when reaching for articles.  Carrying Luggage   Distribute weight evenly on both sides. Use a cart whenever possible. Do not twist trunk. Move body as a unit.   Lifting Principles .Maintain proper posture and head alignment. .Slide object as close as possible before lifting. .Move obstacles out of the way. .Test before lifting; ask for help if too heavy. .Tighten stomach muscles without holding breath. .Use smooth movements; do not jerk. .Use legs to do the work, and pivot with feet. .Distribute the work load symmetrically and close to the center of trunk. .Push instead of pull whenever possible.   Ask For Help   Ask for help and delegate to others when possible. Coordinate your movements when lifting together, and maintain the low back curve.  Log Roll   Lying on back, bend left knee and place left   arm across chest. Roll all in one movement to the right. Reverse to roll to the left. Always move as one unit. Housework - Sweeping  Use long-handled equipment to avoid stooping.   Housework - Wiping  Position yourself as close as possible to reach work surface. Avoid straining your back.  Laundry - Unloading Wash   To unload small items at bottom of washer, lift leg opposite to arm being used to reach.  Shelby close to area to be raked. Use arm movements to do the work. Keep back straight and avoid  twisting.     Cart  When reaching into cart with one arm, lift opposite leg to keep back straight.   Getting Into / Out of Bed  Lower self to lie down on one side by raising legs and lowering head at the same time. Use arms to assist moving without twisting. Bend both knees to roll onto back if desired. To sit up, start from lying on side, and use same move-ments in reverse. Housework - Vacuuming  Hold the vacuum with arm held at side. Step back and forth to move it, keeping head up. Avoid twisting.   Laundry - IT consultant so that bending and twisting can be avoided.   Laundry - Unloading Dryer  Squat down to reach into clothes dryer or use a reacher.  Gardening - Weeding / Probation officer or Kneel. Knee pads may be helpful.                  Bridge series issued from drawer with clams and march. Arm / Leg Lift: Opposite (Prone)  2-3    10 to 20 reps. 2-3 X a week.  Also arms only and legs only 10 X each.  Exercises issued from exercise drawer. Bridge, with clams and marches also added to home exercise program 10 to 20 reps each, holding 5 seconds, or moving slowly.  Issued from exercise drawer.   http://orth.exer.us/101   Copyright  VHI. All rights reserved.

## 2015-03-19 NOTE — Therapy (Signed)
[place], [place], [address] Phone: [phone]   Fax:  [phone]  Physical Therapy Treatment  Patient Details  Name: Nicole Oliver MRN: [id number] Date of Birth: 1966-03-12 Referring Provider:  Gaynelle Arabian, MD  Encounter Date: 03/19/2015      PT End of Session - 03/19/15 1312    Visit Number 14   Date for PT Re-Evaluation 03/27/15   PT Start Time 3818   PT Stop Time 2993   PT Time Calculation (min) 60 min   Activity Tolerance Patient tolerated treatment well;No increased pain      Past Medical History  Diagnosis Date  . Anxiety   . ACL tear 12/2014    left  . Dental crown present   . Hyperlipidemia     Past Surgical History  Procedure Laterality Date  . Nasal reconstruction with septal repair    . Total abdominal hysterectomy w/ bilateral salpingoophorectomy  06/25/2003  . Strabismus surgery    . Knee arthroscopy with anterior cruciate ligament (acl) repair Left 01/10/2015    Procedure: KNEE ARTHROSCOPY WITH ANTERIOR CRUCIATE LIGAMENT (ACL) RECONSTRUCTION WITH BONE-TENDON-BONE (PATELLAR TENDON) ALLOGRAFT.    ARTHROSCOPIC ACL RECONSTRUCTION WITH BONE-TENDON-BONE (PATELLAR TENDON) ALLOGRAFT, _0 , 170#.;  Surgeon: Garald Balding, MD;  Location: [place];  Service: Orthopedics;  Laterality: Left;  ARTHROSCOPIC ACL RECONSTRUCTION WITH BONE-TENDON-BONE (    There were no vitals filed for this visit.  Visit Diagnosis:  Muscle weakness of lower extremity  Difficulty in walking  Swelling of limb  Joint stiffness of knee, left      Subjective Assessment - 03/19/15 1149    Subjective Lt knee patellar tendon burns and is swelling more than usual.  Has hip pain 5/10 this includes intermittant tingling into Leg. Wears tennis shoes lately to see  if it helps her hips.     Currently in Pain? Yes   Pain Score 5    Pain Location Knee   Pain Orientation Left   Pain  Descriptors / Indicators Burning;Shooting;Aching;Nagging;Sore   Pain Relieving Factors cold,    Effect of Pain on Daily Activities uses cane.                         [facility] Adult PT Treatment/Exercise - 03/19/15 1203    Self-Care   Self-Care --  ADL posture education   Knee/Hip Exercises: Aerobic   Stationary Bike Nu step L6, 10.5 minutes   Knee/Hip Exercises: Supine   Bridges 10 reps  5 second holds, added to home   Other Supine Knee/Hip Exercises bridge series added to home program with clams and marching, these were a chalange   Knee/Hip Exercises: Prone   Straight Leg Raises 5 reps   Other Prone Exercises Prone core strengthening,  Leg lifts, alternate arm lifts and opposite arm/leg lifts. added to home exercise   Vasopneumatic   Number Minutes Vasopneumatic  15 minutes   Vasopnuematic Location  Knee   Vasopneumatic Pressure Medium   Vasopneumatic Temperature  32                PT Education - 03/19/15 1311    Education provided Yes   Education Details ADL posture ed, Bridges, prone   Person(s) Educated Patient   Methods Explanation;Tactile cues;Verbal cues;Handout   Comprehension Verbalized understanding;Returned demonstration          PT Short Term Goals - 03/14/15 1237    PT SHORT  TERM GOAL #1   Title be independent in initial HEP   Status Achieved   PT SHORT TERM GOAL #2   Title demonstrate Lt knee AROM flexion to 90 degrees to allow for sitting without substitution   Status Achieved   PT SHORT TERM GOAL #3   Title use 1 crutch for all ambulation wearing hinged brace   Status Achieved   PT SHORT TERM GOAL #4   Title Left knee extension improved to 6 degrees needed for greater ease with ambulation for return to work.   Status Achieved           PT Long Term Goals - 03/19/15 1315    PT LONG TERM GOAL #1   Title be independent in advanced HEP   Time 8   Period Weeks   Status On-going   PT LONG TERM GOAL #2   Title reduce FOTO  to < or = to 40% limitation   Status Unable to assess   PT LONG TERM GOAL #3   Title demonstrate Lt quad strength to 4/5 to improve safety with ambulation   Status Achieved   PT LONG TERM GOAL #4   Title wean from crutches for all distances    Baseline uses cane now   Time 8   Period Weeks   Status Partially Met   PT LONG TERM GOAL #5   Status Achieved   PT LONG TERM GOAL #6   Title improve Lt knee strength to ascend and descend steps with step-over-step gait   Time 8   Period Weeks   Status Unable to assess               Plan - 03/19/15 1313    Clinical Impression Statement Progress toward home exercise goal. pain decreased from 5 to 4/10 today.   PT Next Visit Plan review new home exercises,  strengthening   PT Home Exercise Plan bridge series, prone back   Consulted and Agree with Plan of Care Patient        Problem List Patient Active Problem List   Diagnosis Date Noted  . ACL (anterior cruciate ligament) tear 01/10/2015  . Finger pain 04/13/2012    HARRIS,KAREN 03/19/2015, 1:18 PM  [facility] [address] [place], [place], [address] Phone: [phone]   Fax:  [phone]     Melvenia Needles, PTA 03/19/2015 1:18 PM Phone: [phone] Fax: [phone]

## 2015-03-21 ENCOUNTER — Encounter: Payer: Self-pay | Admitting: Physical Therapy

## 2015-03-26 ENCOUNTER — Ambulatory Visit: Payer: 59 | Attending: Orthopaedic Surgery | Admitting: Physical Therapy

## 2015-03-26 DIAGNOSIS — M25662 Stiffness of left knee, not elsewhere classified: Secondary | ICD-10-CM | POA: Diagnosis present

## 2015-03-26 DIAGNOSIS — M7989 Other specified soft tissue disorders: Secondary | ICD-10-CM | POA: Insufficient documentation

## 2015-03-26 DIAGNOSIS — S83512A Sprain of anterior cruciate ligament of left knee, initial encounter: Secondary | ICD-10-CM | POA: Diagnosis present

## 2015-03-26 DIAGNOSIS — M6281 Muscle weakness (generalized): Secondary | ICD-10-CM | POA: Diagnosis not present

## 2015-03-26 DIAGNOSIS — R262 Difficulty in walking, not elsewhere classified: Secondary | ICD-10-CM | POA: Insufficient documentation

## 2015-03-26 NOTE — Therapy (Signed)
[place], [place], [address] Phone: [phone]   Fax:  [phone]  Physical Therapy Treatment  Patient Details  Name: Nicole Oliver MRN: [id number] Date of Birth: [birth year] Referring Provider:  Gaynelle Arabian, MD  Encounter Date: 03/26/2015      PT End of Session - 03/26/15 1257    Visit Number 15   Number of Visits 21   PT Start Time 1150   PT Stop Time 1250   PT Time Calculation (min) 60 min   Activity Tolerance Patient tolerated treatment well;No increased pain;Patient limited by fatigue   Behavior During Therapy [facility] for tasks assessed/performed      Past Medical History  Diagnosis Date  . Anxiety   . ACL tear 12/2014    left  . Dental crown present   . Hyperlipidemia     Past Surgical History  Procedure Laterality Date  . Nasal reconstruction with septal repair    . Total abdominal hysterectomy w/ bilateral salpingoophorectomy  06/25/2003  . Strabismus surgery    . Knee arthroscopy with anterior cruciate ligament (acl) repair Left 01/10/2015    Procedure: KNEE ARTHROSCOPY WITH ANTERIOR CRUCIATE LIGAMENT (ACL) RECONSTRUCTION WITH BONE-TENDON-BONE (PATELLAR TENDON) ALLOGRAFT.    ARTHROSCOPIC ACL RECONSTRUCTION WITH BONE-TENDON-BONE (PATELLAR TENDON) ALLOGRAFT, 5\' 5" , 170#.;  Surgeon: Garald Balding, MD;  Location: [place];  Service: Orthopedics;  Laterality: Left;  ARTHROSCOPIC ACL RECONSTRUCTION WITH BONE-TENDON-BONE (    There were no vitals filed for this visit.  Visit Diagnosis:  Muscle weakness of lower extremity  Difficulty in walking  Swelling of limb  Joint stiffness of knee, left  ACL tear, left, initial encounter      Subjective Assessment - 03/26/15 1156    Subjective No pain   Currently in Pain? No/denies   Pain Score 0-No pain   Pain Location Knee   Pain Orientation Left   Pain Descriptors / Indicators Aching   Pain Radiating Towards not   Aggravating Factors  prolonged walking   Pain Relieving Factors cold                         [facility] Adult PT Treatment/Exercise - 03/26/15 1201    Knee/Hip Exercises: Aerobic   Stationary Bike L3 8 min.  1.5 miles   Knee/Hip Exercises: Standing   Walking with Sports Cord 10 X 4 way 7, 10 LBS SBA,  Leading RT most challanging.     Knee/Hip Exercises: Supine   Bridges 10 reps   Bridges Limitations Bridge series  10 X also with march, clams and 3 for SLR limited  by fatigu   Vasopneumatic   Number Minutes Vasopneumatic  15 minutes   Vasopnuematic Location  Knee   Vasopneumatic Temperature  32                  PT Short Term Goals - 03/14/15 1237    PT SHORT TERM GOAL #1   Title be independent in initial HEP   Status Achieved   PT SHORT TERM GOAL #2   Title demonstrate Lt knee AROM flexion to 90 degrees to allow for sitting without substitution   Status Achieved   PT SHORT TERM GOAL #3   Title use 1 crutch for all ambulation wearing hinged brace   Status Achieved   PT SHORT TERM GOAL #4   Title Left knee extension improved to 6 degrees needed for greater ease with ambulation  for return to work.   Status Achieved           PT Long Term Goals - 03/26/15 1301    PT LONG TERM GOAL #1   Title be independent in advanced HEP   Time 8   Period Weeks   Status On-going   PT LONG TERM GOAL #2   Title reduce FOTO to < or = to 40% limitation   Time 8   Period Weeks   Status Unable to assess   PT LONG TERM GOAL #3   Time 8   Period Weeks   Status Achieved   PT LONG TERM GOAL #4   Title wean from crutches for all distances    Baseline uses cane intermittantly inside 100% in community/outside   Time 8   Period Weeks   Status Achieved   PT LONG TERM GOAL #5   Title demonstrate Lt knee AROM flexion to > or = to 110 degrees to improve mobility   Status Achieved   PT LONG TERM GOAL #6   Title improve Lt knee strength to ascend and descend steps with  step-over-step gait   Time 8   Period Weeks   Status On-going               Plan - 03/26/15 1300    PT Next Visit Plan continue strengthening,  try minisquat around mat,  plyotoss single leg on pod?   Consulted and Agree with Plan of Care Patient        Problem List Patient Active Problem List   Diagnosis Date Noted  . ACL (anterior cruciate ligament) tear 01/10/2015  . Finger pain 04/13/2012    Jaysten Essner 03/26/2015, 1:04 PM  [facility] [address] [place], [place], [address] Phone: [phone]   Fax:  [phone]     Melvenia Needles, PTA 03/26/2015 1:04 PM Phone: [phone] Fax: [phone]

## 2015-03-27 ENCOUNTER — Other Ambulatory Visit: Payer: Self-pay

## 2015-03-27 DIAGNOSIS — Z1231 Encounter for screening mammogram for malignant neoplasm of breast: Secondary | ICD-10-CM

## 2015-03-28 ENCOUNTER — Ambulatory Visit
Admission: RE | Admit: 2015-03-28 | Discharge: 2015-03-28 | Disposition: A | Discharge status: Home / Self Care | Payer: 59 | Source: Ambulatory Visit

## 2015-03-28 ENCOUNTER — Ambulatory Visit: Payer: 59 | Admitting: Physical Therapy

## 2015-03-28 DIAGNOSIS — S83512A Sprain of anterior cruciate ligament of left knee, initial encounter: Secondary | ICD-10-CM

## 2015-03-28 DIAGNOSIS — M25662 Stiffness of left knee, not elsewhere classified: Secondary | ICD-10-CM

## 2015-03-28 DIAGNOSIS — R262 Difficulty in walking, not elsewhere classified: Secondary | ICD-10-CM

## 2015-03-28 DIAGNOSIS — M6281 Muscle weakness (generalized): Secondary | ICD-10-CM

## 2015-03-28 DIAGNOSIS — M7989 Other specified soft tissue disorders: Secondary | ICD-10-CM

## 2015-03-28 DIAGNOSIS — Z1231 Encounter for screening mammogram for malignant neoplasm of breast: Secondary | ICD-10-CM

## 2015-03-28 NOTE — Therapy (Signed)
[place], [place], [address] Phone: [phone]   Fax:  [phone]  Physical Therapy Treatment/Recertification  Patient Details  Name: Nicole Oliver MRN: [id number] Date of Birth: 05-07-1966 Referring Provider:  Gaynelle Arabian, MD  Encounter Date: 03/28/2015      PT End of Session - 03/28/15 1239    Visit Number 16   Number of Visits 25   Date for PT Re-Evaluation 05/15/15   Authorization Type [facility]   PT Start Time 1140   PT Stop Time 6294   PT Time Calculation (min) 65 min   Activity Tolerance Patient tolerated treatment well      Past Medical History  Diagnosis Date  . Anxiety   . ACL tear 12/2014    left  . Dental crown present   . Hyperlipidemia     Past Surgical History  Procedure Laterality Date  . Nasal reconstruction with septal repair    . Total abdominal hysterectomy w/ bilateral salpingoophorectomy  06/25/2003  . Strabismus surgery    . Knee arthroscopy with anterior cruciate ligament (acl) repair Left 01/10/2015    Procedure: KNEE ARTHROSCOPY WITH ANTERIOR CRUCIATE LIGAMENT (ACL) RECONSTRUCTION WITH BONE-TENDON-BONE (PATELLAR TENDON) ALLOGRAFT.    ARTHROSCOPIC ACL RECONSTRUCTION WITH BONE-TENDON-BONE (PATELLAR TENDON) ALLOGRAFT, _0 , 170#.;  Surgeon: Garald Balding, MD;  Location: [place];  Service: Orthopedics;  Laterality: Left;  ARTHROSCOPIC ACL RECONSTRUCTION WITH BONE-TENDON-BONE (    There were no vitals filed for this visit.  Visit Diagnosis:  Muscle weakness of lower extremity - Plan: PT plan of care cert/re-cert  Difficulty in walking - Plan: PT plan of care cert/re-cert  Swelling of limb - Plan: PT plan of care cert/re-cert  Joint stiffness of knee, left - Plan: PT plan of care cert/re-cert  ACL tear, left, initial encounter - Plan: PT plan of care cert/re-cert      Subjective Assessment - 03/28/15 1142    Subjective MD rescheduled until  10/24.  Using [facility] in community only not in the house.  No brace.  Over the past 2 weeks 150 miles on my bike.  Still some pain with a lot of walking, activity if I turn the wrong way, it will irritate the front of the knee (patellar tendon).     Currently in Pain? No/denies   Pain Score 0-No pain   Pain Location Knee   Pain Orientation Left   Pain Type Surgical pain   Pain Frequency Intermittent            [facility] PT Assessment - 03/28/15 1150    Observation/Other Assessments   Focus on Therapeutic Outcomes (FOTO)  55% no change from last assessment 2-3 weeks ago   AROM   Left Knee Extension 0   Left Knee Flexion 133   Strength   Left Hip Extension 4+/5   Left Hip ADduction 4+/5   Left Knee Flexion 4/5   Left Knee Extension 4/5   Flexibility   Soft Tissue Assessment /Muscle Length --  Left HS length 95 degrees                     [facility] Adult PT Treatment/Exercise - 03/28/15 1149    Knee/Hip Exercises: Aerobic   Stationary Bike Bike 8 min   Elliptical 2 min lowest level   Knee/Hip Exercises: Standing   Forward Lunges Right;Left;1 set;10 reps   Forward Lunges Limitations alternating small range in parallel bars   Stairs  up and down 6 inch steps reciprocally, descend 4inch steps reciprocally   Other Standing Knee Exercises fig 8 walk, high step, [place], side step fast feet.  box step    Cryotherapy   Number Minutes Cryotherapy 10 Minutes   Cryotherapy Location Knee   Type of Cryotherapy Ice pack                  PT Short Term Goals - 03/28/15 1243    PT SHORT TERM GOAL #1   Title be independent in initial HEP   Status Achieved   PT SHORT TERM GOAL #2   Title demonstrate Lt knee AROM flexion to 90 degrees to allow for sitting without substitution   Status Achieved   PT SHORT TERM GOAL #3   Title use 1 crutch for all ambulation wearing hinged brace   Status Achieved   PT SHORT TERM GOAL #4   Title Left knee extension improved to 6 degrees  needed for greater ease with ambulation for return to work.   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  Able to ascend steps reciprocally but difficulty descending steps secondary to patellar tendon region pain.  Quad and HS strength 4/5, hip grossly 5-/5.  She is progressing with long term rehab goals but would benefit from further quad strengthening especially in weight bearing to prepare her for return to work.     Pt will benefit from skilled therapeutic intervention in order to improve on the following deficits Abnormal gait;Decreased activity tolerance;Decreased range of motion;Decreased strength;Difficulty walking;Pain;Impaired flexibility;Increased edema   Rehab Potential Good   PT Frequency 2x / week   PT Duration 6 weeks   PT Treatment/Interventions ADLs/Self Care Home Management;Cryotherapy;Paramedic;Therapeutic activities;Therapeutic exercise;Neuromuscular re-education;Patient/family education;Manual techniques;Vasopneumatic Device;Passive range of motion   PT Next Visit Plan continue strengthening,  try minisquat around mat,  plyotoss single leg on pod?;  Elliptical         Problem List Patient Active Problem List   Diagnosis Date Noted  .  ACL (anterior cruciate ligament) tear 01/10/2015  . Finger pain 04/13/2012    [name] 03/28/2015, 1:15 PM  [facility] [address] [place], [place], [address] Phone: [phone]   Fax:  [phone]    Ruben Im, PT 03/28/2015 1:16 PM Phone: [phone] Fax: [phone]

## 2015-03-29 ENCOUNTER — Other Ambulatory Visit: Payer: Self-pay | Admitting: Gynecology

## 2015-03-29 DIAGNOSIS — R928 Other abnormal and inconclusive findings on diagnostic imaging of breast: Secondary | ICD-10-CM

## 2015-04-01 ENCOUNTER — Ambulatory Visit: Payer: 59 | Admitting: Physical Therapy

## 2015-04-01 DIAGNOSIS — M6281 Muscle weakness (generalized): Secondary | ICD-10-CM

## 2015-04-01 DIAGNOSIS — M7989 Other specified soft tissue disorders: Secondary | ICD-10-CM

## 2015-04-01 DIAGNOSIS — R262 Difficulty in walking, not elsewhere classified: Secondary | ICD-10-CM

## 2015-04-01 DIAGNOSIS — S83512A Sprain of anterior cruciate ligament of left knee, initial encounter: Secondary | ICD-10-CM

## 2015-04-01 DIAGNOSIS — M25662 Stiffness of left knee, not elsewhere classified: Secondary | ICD-10-CM

## 2015-04-01 NOTE — Therapy (Signed)
[place], [place], [address] Phone: [phone]   Fax:  [phone]  Physical Therapy Treatment  Patient Details  Name: Nicole Oliver MRN: [id number] Date of Birth: August 11, 1965 Referring Provider:  Garald Balding, MD  Encounter Date: 04/01/2015      PT End of Session - 04/01/15 1256    Visit Number 17   Number of Visits 25   Date for PT Re-Evaluation 05/15/15   PT Start Time 1448   PT Stop Time 1856   PT Time Calculation (min) 57 min   Activity Tolerance Patient tolerated treatment well;No increased pain   Behavior During Therapy [facility] for tasks assessed/performed      Past Medical History  Diagnosis Date  . Anxiety   . ACL tear 12/2014    left  . Dental crown present   . Hyperlipidemia     Past Surgical History  Procedure Laterality Date  . Nasal reconstruction with septal repair    . Total abdominal hysterectomy w/ bilateral salpingoophorectomy  06/25/2003  . Strabismus surgery    . Knee arthroscopy with anterior cruciate ligament (acl) repair Left 01/10/2015    Procedure: KNEE ARTHROSCOPY WITH ANTERIOR CRUCIATE LIGAMENT (ACL) RECONSTRUCTION WITH BONE-TENDON-BONE (PATELLAR TENDON) ALLOGRAFT.    ARTHROSCOPIC ACL RECONSTRUCTION WITH BONE-TENDON-BONE (PATELLAR TENDON) ALLOGRAFT, _0 , 170#.;  Surgeon: Garald Balding, MD;  Location: [place];  Service: Orthopedics;  Laterality: Left;  ARTHROSCOPIC ACL RECONSTRUCTION WITH BONE-TENDON-BONE (    There were no vitals filed for this visit.  Visit Diagnosis:  Muscle weakness of lower extremity  Difficulty in walking  Swelling of limb  Joint stiffness of knee, left  ACL tear, left, initial encounter      Subjective Assessment - 04/01/15 1158    Subjective Doing OK.                           [place] Adult PT Treatment/Exercise - 04/01/15 1150    Knee/Hip Exercises: Aerobic   Elliptical 5 minutes ramp 1    Knee/Hip Exercises: Standing   Forward Step Up Left;1 set;10 reps;Hand Hold: 1;Step Height: 6";Step Height: 8";Limitations   Forward Step Up Limitations Burning reported in patellar tendon on 8 " step so stopped and taped McConnell tape to tendon.  able to have less pain post taping.     SLS with Vectors on Lt plyotoss 2-3  reps in a row , best .  SBA for safety.   Other Standing Knee Exercises mini squat around mat 1 X each way.     Knee/Hip Exercises: Supine   Straight Leg Raises 3 sets  3 LB weight, 10 X each set   Knee/Hip Exercises: Prone   Other Prone Exercises terminal knee extension, 20 X 3 LBS   Cryotherapy   Number Minutes Cryotherapy 10 Minutes   Cryotherapy Location Knee   Manual Therapy   Manual therapy comments taping patellar tendon                  PT Short Term Goals - 03/28/15 1243    PT SHORT TERM GOAL #1   Title be independent in initial HEP   Status Achieved   PT SHORT TERM GOAL #2   Title demonstrate Lt knee AROM flexion to 90 degrees to allow for sitting without substitution   Status Achieved   PT SHORT TERM GOAL #3   Title use 1 crutch for all ambulation  wearing hinged brace   Status Achieved   PT SHORT TERM GOAL #4   Title Left knee extension improved to 6 degrees needed for greater ease with ambulation for return to work.   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  tape helpful.  No pain at the end of exercise prior to cold.        Problem List Patient Active Problem List   Diagnosis Date Noted  . ACL (anterior cruciate ligament) tear 01/10/2015  . Finger pain 04/13/2012    HARRIS,KAREN 04/01/2015, 1:02 PM  [facility] [address] [place], [place], [address] Phone: [phone]   Fax:  [phone]     Melvenia Needles, PTA 04/01/2015 1:02 PM Phone: [phone] Fax: [phone]

## 2015-04-03 ENCOUNTER — Encounter: Payer: Self-pay | Admitting: Physical Therapy

## 2015-04-08 ENCOUNTER — Other Ambulatory Visit: Payer: Self-pay

## 2015-04-08 ENCOUNTER — Ambulatory Visit
Admission: RE | Admit: 2015-04-08 | Discharge: 2015-04-08 | Disposition: A | Discharge status: Home / Self Care | Payer: 59 | Source: Ambulatory Visit | Attending: Gynecology | Admitting: Gynecology

## 2015-04-08 DIAGNOSIS — R928 Other abnormal and inconclusive findings on diagnostic imaging of breast: Secondary | ICD-10-CM

## 2015-04-09 ENCOUNTER — Ambulatory Visit: Payer: 59 | Admitting: Physical Therapy

## 2015-04-09 DIAGNOSIS — M25662 Stiffness of left knee, not elsewhere classified: Secondary | ICD-10-CM

## 2015-04-09 DIAGNOSIS — M6281 Muscle weakness (generalized): Secondary | ICD-10-CM

## 2015-04-09 DIAGNOSIS — S83512A Sprain of anterior cruciate ligament of left knee, initial encounter: Secondary | ICD-10-CM

## 2015-04-09 DIAGNOSIS — R262 Difficulty in walking, not elsewhere classified: Secondary | ICD-10-CM

## 2015-04-09 DIAGNOSIS — M7989 Other specified soft tissue disorders: Secondary | ICD-10-CM

## 2015-04-09 NOTE — Therapy (Signed)
[place], [place], [address] Phone: [phone]   Fax:  [phone]  Physical Therapy Treatment  Patient Details  Name: Nicole Oliver MRN: [id number] Date of Birth: Apr 01, 1966 No Data Recorded  Encounter Date: 04/09/2015      PT End of Session - 04/09/15 1107    Visit Number 18   Number of Visits 25   Date for PT Re-Evaluation 05/15/15   Authorization Type [facility]   PT Start Time 1015   PT Stop Time 1115   PT Time Calculation (min) 60 min   Activity Tolerance Patient tolerated treatment well      Past Medical History  Diagnosis Date  . Anxiety   . ACL tear 12/2014    left  . Dental crown present   . Hyperlipidemia     Past Surgical History  Procedure Laterality Date  . Nasal reconstruction with septal repair    . Total abdominal hysterectomy w/ bilateral salpingoophorectomy  06/25/2003  . Strabismus surgery    . Knee arthroscopy with anterior cruciate ligament (acl) repair Left 01/10/2015    Procedure: KNEE ARTHROSCOPY WITH ANTERIOR CRUCIATE LIGAMENT (ACL) RECONSTRUCTION WITH BONE-TENDON-BONE (PATELLAR TENDON) ALLOGRAFT.    ARTHROSCOPIC ACL RECONSTRUCTION WITH BONE-TENDON-BONE (PATELLAR TENDON) ALLOGRAFT, '5\' 5"' , 170#.;  Surgeon: Garald Balding, MD;  Location: [place];  Service: Orthopedics;  Laterality: Left;  ARTHROSCOPIC ACL RECONSTRUCTION WITH BONE-TENDON-BONE (    There were no vitals filed for this visit.  Visit Diagnosis:  Muscle weakness of lower extremity  Difficulty in walking  Swelling of limb  Joint stiffness of knee, left  ACL tear, left, initial encounter      Subjective Assessment - 04/09/15 1019    Subjective My back is bothering me.  I think it is from walking unevenly.  Sometimes when I get up, my knee is stiff and hard to get up in the mornings.  "I'm using the cane less and less."   Currently in Pain? No/denies   Pain Location Knee   Pain  Orientation Left   Pain Type Surgical pain   Pain Onset More than a month ago   Pain Frequency Intermittent   Aggravating Factors  early AM   Pain Relieving Factors taping helped                         [facility] Adult PT Treatment/Exercise - 04/09/15 1043    Knee/Hip Exercises: Aerobic   Stationary Bike Bike 6 min   Knee/Hip Exercises: Standing   Lateral Step Up Left;1 set;15 reps;Hand Hold: 1;Step Height: 6"   Lateral Step Up Limitations with right hip abduction; tape on   Forward Step Up Left;1 set;15 reps;Hand Hold: 1;Step Height: 6"   Forward Step Up Limitations with tape;  right on chair for proprioception   SLS with Vectors Y formation 15x   Knee/Hip Exercises: Supine   Other Supine Knee/Hip Exercises ab brace series with LE movements   Knee/Hip Exercises: Sidelying   Other Sidelying Knee/Hip Exercises sideplank 3x 3x   Knee/Hip Exercises: Prone   Hip Extension AROM;Right;Left;1 set;10 reps  multifidi press   Other Prone Exercises prone press ups 10x   Cryotherapy   Number Minutes Cryotherapy 10 Minutes   Cryotherapy Location Knee   Manual Therapy   Manual therapy comments patellar tendon taping                  PT Short Term  Goals - 04/09/15 1118    PT SHORT TERM GOAL #1   Title be independent in initial HEP   Status Achieved   PT SHORT TERM GOAL #2   Title demonstrate Lt knee AROM flexion to 90 degrees to allow for sitting without substitution   Status Achieved   PT SHORT TERM GOAL #3   Title use 1 crutch for all ambulation wearing hinged brace   Status Achieved   PT SHORT TERM GOAL #4   Title Left knee extension improved to 6 degrees needed for greater ease with ambulation for return to work.   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, traveling between the campuses)   Time 6   Period Weeks   Status On-going   PT LONG TERM GOAL #5   Title demonstrate Lt knee AROM flexion to > or = to 135 degrees to improve mobility   Time 6   Period Weeks   Status On-going   PT LONG TERM GOAL #6   Title improve Lt knee strength to ascend and descend steps with step-over-step gait   Time 6   Period Weeks   Status On-going               Plan - 04/09/15 1107    Clinical Impression Statement The patient has significant core weakness and activation of transverse abdominus and lumbar multifidi which may account for lower back pain  with walking in addition to long duration of limping since Easter.  Taping eases symptoms in patellar tendon region.  She needs continued continued PT to address these deficits to decrease risk of long term problems.  Therapist closely monitoring and providing verbal cues for proper technique.     PT Next Visit Plan continue core, quad and gluteal strengthening,  try minisquat around mat,  plyotoss single leg on pod?;  Elliptical ;  patellar tendon taping as needed        Problem List Patient Active Problem List   Diagnosis Date Noted  . ACL (anterior cruciate ligament) tear 01/10/2015  . Finger pain 04/13/2012    [name] 04/09/2015, 11:21 AM  [facility] [address] [place], [place], [address] Phone: [phone]   Fax:  [phone]  Name: Nicole Oliver MRN: [id number] Date of Birth: [birth year]  Ruben Im, PT 04/09/2015 11:21 AM Phone: [phone] Fax: [phone]

## 2015-04-11 ENCOUNTER — Ambulatory Visit: Payer: 59 | Admitting: Physical Therapy

## 2015-04-11 DIAGNOSIS — M6281 Muscle weakness (generalized): Secondary | ICD-10-CM

## 2015-04-11 DIAGNOSIS — R262 Difficulty in walking, not elsewhere classified: Secondary | ICD-10-CM

## 2015-04-11 DIAGNOSIS — S83512A Sprain of anterior cruciate ligament of left knee, initial encounter: Secondary | ICD-10-CM

## 2015-04-11 DIAGNOSIS — M7989 Other specified soft tissue disorders: Secondary | ICD-10-CM

## 2015-04-11 DIAGNOSIS — M25662 Stiffness of left knee, not elsewhere classified: Secondary | ICD-10-CM

## 2015-04-11 NOTE — Therapy (Signed)
[place], [place], [address] Phone: [phone]   Fax:  [phone]  Physical Therapy Treatment  Patient Details  Name: Nicole Oliver MRN: [id number] Date of Birth: 1965/06/27 No Data Recorded  Encounter Date: 04/11/2015      PT End of Session - 04/11/15 1100    Visit Number 19   Number of Visits 25   Date for PT Re-Evaluation 05/15/15   PT Start Time 7353   PT Stop Time 1110   PT Time Calculation (min) 55 min   Activity Tolerance Patient tolerated treatment well;No increased pain   Behavior During Therapy [facility] for tasks assessed/performed      Past Medical History  Diagnosis Date  . Anxiety   . ACL tear 12/2014    left  . Dental crown present   . Hyperlipidemia     Past Surgical History  Procedure Laterality Date  . Nasal reconstruction with septal repair    . Total abdominal hysterectomy w/ bilateral salpingoophorectomy  06/25/2003  . Strabismus surgery    . Knee arthroscopy with anterior cruciate ligament (acl) repair Left 01/10/2015    Procedure: KNEE ARTHROSCOPY WITH ANTERIOR CRUCIATE LIGAMENT (ACL) RECONSTRUCTION WITH BONE-TENDON-BONE (PATELLAR TENDON) ALLOGRAFT.    ARTHROSCOPIC ACL RECONSTRUCTION WITH BONE-TENDON-BONE (PATELLAR TENDON) ALLOGRAFT, '5\' 5"' , 170#.;  Surgeon: Garald Balding, MD;  Location: [place];  Service: Orthopedics;  Laterality: Left;  ARTHROSCOPIC ACL RECONSTRUCTION WITH BONE-TENDON-BONE (    There were no vitals filed for this visit.  Visit Diagnosis:  Muscle weakness of lower extremity  Difficulty in walking  Swelling of limb  Joint stiffness of knee, left  ACL tear, left, initial encounter      Subjective Assessment - 04/11/15 1021    Subjective 2/10 knee.  Aches at the end of the day when I've been up and around most of the day.  Sleeping better at night.   Currently in Pain? Yes   Pain Score 2    Pain Location Knee   Pain  Orientation Left   Pain Descriptors / Indicators Sore   Pain Frequency Intermittent   Aggravating Factors  long active days   Pain Relieving Factors tape   Effect of Pain on Daily Activities wakes her up   Multiple Pain Sites No                         [facility] Adult PT Treatment/Exercise - 04/11/15 1028    Ambulation/Gait   Ambulation/Gait Yes   Stairs --  4-5 X 4 steps, 2 rails, cues   Knee/Hip Exercises: Aerobic   Elliptical 5 minutes  ramp 1, difficult   Knee/Hip Exercises: Standing   Heel Raises 10 reps;2 sets  LT only, Difficult   Forward Step Up Left;1 set;Hand Hold: 1;Step Height: 8";Limitations   Wall Squat 10 reps   Knee/Hip Exercises: Prone   Other Prone Exercises multifitus press, knee flexipon and Leg lifts added to home   Cryotherapy   Number Minutes Cryotherapy 10 Minutes   Cryotherapy Location Knee   Type of Cryotherapy --  cold pack                PT Education - 04/11/15 1055    Education provided Yes   Education Details multifitus   Person(s) Educated Patient   Methods Explanation;Demonstration;Verbal cues;Handout   Comprehension Verbalized understanding;Returned demonstration          PT Short Term Goals -  04/09/15 1118    PT SHORT TERM GOAL #1   Title be independent in initial HEP   Status Achieved   PT SHORT TERM GOAL #2   Title demonstrate Lt knee AROM flexion to 90 degrees to allow for sitting without substitution   Status Achieved   PT SHORT TERM GOAL #3   Title use 1 crutch for all ambulation wearing hinged brace   Status Achieved   PT SHORT TERM GOAL #4   Title Left knee extension improved to 6 degrees needed for greater ease with ambulation for return to work.   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  steps getting easier with 2 rails, still 1 step at a time at home.    PT Next Visit Plan 8 inch steps, multifitus, strength   PT Home Exercise Plan multifitus   Consulted and Agree with Plan of Care Patient        Problem List Patient Active Problem List   Diagnosis Date Noted  . ACL (anterior cruciate ligament) tear 01/10/2015  . Finger pain 04/13/2012    HARRIS,KAREN 04/11/2015, 11:04 AM  [facility] [address] [place], [place], [address] Phone: [phone]   Fax:  [phone]  Name: Nicole Oliver MRN: [id number] Date of Birth: 02/29/1966    Melvenia Needles, PTA 04/11/2015 11:04 AM Phone: [phone] Fax: [phone]

## 2015-04-16 ENCOUNTER — Ambulatory Visit: Payer: 59 | Admitting: Physical Therapy

## 2015-04-16 DIAGNOSIS — M6281 Muscle weakness (generalized): Secondary | ICD-10-CM | POA: Diagnosis not present

## 2015-04-16 DIAGNOSIS — M7989 Other specified soft tissue disorders: Secondary | ICD-10-CM

## 2015-04-16 DIAGNOSIS — M25662 Stiffness of left knee, not elsewhere classified: Secondary | ICD-10-CM

## 2015-04-16 DIAGNOSIS — R262 Difficulty in walking, not elsewhere classified: Secondary | ICD-10-CM

## 2015-04-16 DIAGNOSIS — S83512A Sprain of anterior cruciate ligament of left knee, initial encounter: Secondary | ICD-10-CM

## 2015-04-16 NOTE — Therapy (Signed)
[place], [place], [address] Phone: [phone]   Fax:  [phone]  Physical Therapy Treatment  Patient Details  Name: Nicole Oliver MRN: [id number] Date of Birth: Jul 31, 1965 No Data Recorded  Encounter Date: 04/16/2015      PT End of Session - 04/16/15 1249    Visit Number 20   Number of Visits 25   Date for PT Re-Evaluation 05/15/15   Authorization Type [facility]   PT Start Time 1100   PT Stop Time 1200   PT Time Calculation (min) 60 min   Activity Tolerance Patient tolerated treatment well      Past Medical History  Diagnosis Date  . Anxiety   . ACL tear 12/2014    left  . Dental crown present   . Hyperlipidemia     Past Surgical History  Procedure Laterality Date  . Nasal reconstruction with septal repair    . Total abdominal hysterectomy w/ bilateral salpingoophorectomy  06/25/2003  . Strabismus surgery    . Knee arthroscopy with anterior cruciate ligament (acl) repair Left 01/10/2015    Procedure: KNEE ARTHROSCOPY WITH ANTERIOR CRUCIATE LIGAMENT (ACL) RECONSTRUCTION WITH BONE-TENDON-BONE (PATELLAR TENDON) ALLOGRAFT.    ARTHROSCOPIC ACL RECONSTRUCTION WITH BONE-TENDON-BONE (PATELLAR TENDON) ALLOGRAFT, 5\' 5" , 170#.;  Surgeon: Garald Balding, MD;  Location: [place];  Service: Orthopedics;  Laterality: Left;  ARTHROSCOPIC ACL RECONSTRUCTION WITH BONE-TENDON-BONE (    There were no vitals filed for this visit.  Visit Diagnosis:  Muscle weakness of lower extremity  Difficulty in walking  Swelling of limb  Joint stiffness of knee, left  ACL tear, left, initial encounter      Subjective Assessment - 04/16/15 1105    Subjective Reports medial knee pain and some anterior knee pain achiness .  Sees MD 04/22/15.   Currently in Pain? Yes   Pain Score 2    Pain Location Knee   Pain Orientation Left   Pain Descriptors / Indicators Aching   Pain Type Surgical pain   Pain Onset More than a month ago   Pain Frequency Intermittent   Aggravating Factors  when I'm up too long   Pain Relieving Factors tape helps some                         [facility] Adult PT Treatment/Exercise - 04/16/15 1117    Knee/Hip Exercises: Aerobic   Stationary Bike 3 min   Tread Mill 1.5 mph 15 min 1/4 mile with taping    Knee/Hip Exercises: Machines for Strengthening   Cybex Leg Press 20# Seat 8 3x 10   Hip Cybex 30# B hip abd and hip ext 3x 10 each   Knee/Hip Exercises: Standing   Walking with Sports Cord Backwards only 10x 2 plates    Cryotherapy   Number Minutes Cryotherapy 10 Minutes   Cryotherapy Location Knee   Type of Cryotherapy Ice pack   Manual Therapy   Other Manual Therapy McConnell strapping tape lateral glide correction and patellar tendon taping                  PT Short Term Goals - 04/16/15 1257    PT SHORT TERM GOAL #1   Title be independent in initial HEP   Status Achieved   PT SHORT TERM GOAL #2   Title demonstrate Lt knee AROM flexion to 90 degrees to allow for sitting without substitution   Status Achieved  PT SHORT TERM GOAL #3   Title use 1 crutch for all ambulation wearing hinged brace   Status Achieved   PT SHORT TERM GOAL #4   Title Left knee extension improved to 6 degrees needed for greater ease with ambulation for return to work.   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  She has medial knee pain and patellar tendon region pain with some relief with taping.  Great difficulty with single leg press and is only able to do from a small knee angle.  She is able to ambulate without assistive device into the clinic today with moderate limp.  Therapist closely monitoring pain and modifying as needed.    PT Next Visit Plan Check knee ROM, MMT.  See MD note for appt 10/31;  Single leg press; Taping as needed; increase gait speed on TM        Problem List Patient Active Problem List   Diagnosis Date Noted  . ACL (anterior cruciate ligament) tear 01/10/2015  . Finger pain 04/13/2012    [name] 04/16/2015, 1:00 PM  [facility] [address] [place], [place], [address] Phone: [phone]   Fax:  [phone]  Name: JULITA OZBUN MRN: [id number] Date of Birth: Sep 11, 1965   Ruben Im, PT 04/16/2015 1:00 PM Phone: [phone] Fax: [phone]

## 2015-04-18 ENCOUNTER — Encounter: Payer: Self-pay | Admitting: Physical Therapy

## 2015-05-01 ENCOUNTER — Ambulatory Visit: Payer: 59 | Attending: Orthopaedic Surgery | Admitting: Physical Therapy

## 2015-05-01 DIAGNOSIS — R262 Difficulty in walking, not elsewhere classified: Secondary | ICD-10-CM | POA: Diagnosis present

## 2015-05-01 DIAGNOSIS — M25662 Stiffness of left knee, not elsewhere classified: Secondary | ICD-10-CM | POA: Insufficient documentation

## 2015-05-01 DIAGNOSIS — X58XXXA Exposure to other specified factors, initial encounter: Secondary | ICD-10-CM | POA: Diagnosis not present

## 2015-05-01 DIAGNOSIS — M6281 Muscle weakness (generalized): Secondary | ICD-10-CM | POA: Insufficient documentation

## 2015-05-01 DIAGNOSIS — S83512A Sprain of anterior cruciate ligament of left knee, initial encounter: Secondary | ICD-10-CM | POA: Insufficient documentation

## 2015-05-01 DIAGNOSIS — M7989 Other specified soft tissue disorders: Secondary | ICD-10-CM | POA: Insufficient documentation

## 2015-05-01 NOTE — Therapy (Signed)
[place], [place], [address] Phone: [phone]   Fax:  [phone]  Physical Therapy Treatment  Patient Details  Name: Nicole Oliver MRN: [id number] Date of Birth: 16-May-1966 No Data Recorded  Encounter Date: 05/01/2015      PT End of Session - 05/01/15 1418    Visit Number 21   Number of Visits 25   Date for PT Re-Evaluation 05/15/15   PT Start Time 1740   PT Stop Time 1418   PT Time Calculation (min) 44 min   Activity Tolerance Patient tolerated treatment well   Behavior During Therapy [facility] for tasks assessed/performed      Past Medical History  Diagnosis Date  . Anxiety   . ACL tear 12/2014    left  . Dental crown present   . Hyperlipidemia     Past Surgical History  Procedure Laterality Date  . Nasal reconstruction with septal repair    . Total abdominal hysterectomy w/ bilateral salpingoophorectomy  06/25/2003  . Strabismus surgery    . Knee arthroscopy with anterior cruciate ligament (acl) repair Left 01/10/2015    Procedure: KNEE ARTHROSCOPY WITH ANTERIOR CRUCIATE LIGAMENT (ACL) RECONSTRUCTION WITH BONE-TENDON-BONE (PATELLAR TENDON) ALLOGRAFT.    ARTHROSCOPIC ACL RECONSTRUCTION WITH BONE-TENDON-BONE (PATELLAR TENDON) ALLOGRAFT, 5\' 5" , 170#.;  Surgeon: Garald Balding, MD;  Location: [place];  Service: Orthopedics;  Laterality: Left;  ARTHROSCOPIC ACL RECONSTRUCTION WITH BONE-TENDON-BONE (    There were no vitals filed for this visit.  Visit Diagnosis:  Muscle weakness of lower extremity  Difficulty in walking  Joint stiffness of knee, left      Subjective Assessment - 05/01/15 1341    Subjective Saw MD, last week.  She is doing as expected, everyone gets better at their own speed.  She can continue PT if she feels she needs to and she feels like she still has a need.  She is working part days.  Getting better with steps ascending.  Descending difficult.      Currently in Pain? Yes   Pain Score --  mild, no number given   Pain Location Knee   Pain Orientation Left   Pain Descriptors / Indicators Tingling;Burning   Pain Radiating Towards shin   Aggravating Factors  going down steps   Pain Relieving Factors warm showers                         [facility] Adult PT Treatment/Exercise - 05/01/15 1410    Knee/Hip Exercises: Aerobic   Tread Mill 1.5 3 minutes, 1.7 MPH 12 minutes   Knee/Hip Exercises: Machines for Strengthening   Cybex Knee Extension 2 plates end range only 20 + reps   Cybex Knee Flexion 2 plates 1 nad 2 legs   Total Gym Leg Press 1, 2 plates 2 legs,  1 plate 2 legs press into extension then 1 leg lower sleg eccentric.  Sled on #7.  Burning distal patella decreased with increased reps.     Hip Cybex abduction, extension 2 plates 10 X each.   Knee/Hip Exercises: Standing   Other Standing Knee Exercises other closed chain exercises.    for strengthening ie. double and single leg bridge   Knee/Hip Exercises: Supine   Bridges Limitations single leg 10 X   Ankle Exercises: Machines for Strengthening   Cybex Leg Press 2 plates 2 legs 1 plate 2 legs straight, 1 leg flex,  sled on # 7.  knee flexion, 5 second hold 30+ 1 plate.                   PT Short Term Goals - 04/16/15 1257    PT SHORT TERM GOAL #1   Title be independent in initial HEP   Status Achieved   PT SHORT TERM GOAL #2   Title demonstrate Lt knee AROM flexion to 90 degrees to allow for sitting without substitution   Status Achieved   PT SHORT TERM GOAL #3   Title use 1 crutch for all ambulation wearing hinged brace   Status Achieved   PT SHORT TERM GOAL #4   Title Left knee extension improved to 6 degrees needed for greater ease with ambulation for return to work.   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     Time 6   Period Weeks   Status On-going   PT LONG TERM GOAL #5   Title demonstrate Lt knee AROM flexion to > or = to 135 degrees to improve mobility   Baseline 134 AA    Time 6   Period Weeks   Status On-going   PT LONG TERM GOAL #6   Title improve Lt knee strength to ascend and descend steps with step-over-step gait   Baseline unable step over step at home   Time 6   Period Weeks   Status On-going               Problem List Patient Active Problem List   Diagnosis Date Noted  . ACL (anterior cruciate ligament) tear 01/10/2015  . Finger pain 04/13/2012    Amorette Charrette 05/01/2015, 5:09 PM  [facility] [address] [place], [place], [address] Phone: [phone]   Fax:  [phone]  Name: Nicole Oliver MRN: [id number] Date of Birth: September 11, 1965    Melvenia Needles, PTA 05/01/2015 5:09 PM Phone: [phone] Fax: [phone]

## 2015-05-06 ENCOUNTER — Ambulatory Visit: Payer: 59 | Admitting: Physical Therapy

## 2015-05-07 ENCOUNTER — Encounter: Payer: Self-pay | Admitting: Gynecology

## 2015-05-07 ENCOUNTER — Encounter: Payer: 59 | Admitting: Gynecology

## 2015-05-07 ENCOUNTER — Ambulatory Visit (INDEPENDENT_AMBULATORY_CARE_PROVIDER_SITE_OTHER): Payer: 59 | Admitting: Gynecology

## 2015-05-07 VITALS — BP 120/76 | Ht 65.0 in | Wt 175.0 lb

## 2015-05-07 DIAGNOSIS — Z1321 Encounter for screening for nutritional disorder: Secondary | ICD-10-CM

## 2015-05-07 DIAGNOSIS — Z79890 Hormone replacement therapy (postmenopausal): Secondary | ICD-10-CM | POA: Diagnosis not present

## 2015-05-07 DIAGNOSIS — Z1329 Encounter for screening for other suspected endocrine disorder: Secondary | ICD-10-CM

## 2015-05-07 DIAGNOSIS — Z13220 Encounter for screening for lipoid disorders: Secondary | ICD-10-CM

## 2015-05-07 DIAGNOSIS — Z01419 Encounter for gynecological examination (general) (routine) without abnormal findings: Secondary | ICD-10-CM | POA: Diagnosis not present

## 2015-05-07 LAB — CBC WITH DIFFERENTIAL/PLATELET
BASOS ABS: 0 10*3/uL (ref 0.0–0.1)
BASOS PCT: 0 % (ref 0–1)
Eosinophils Absolute: 0.1 10*3/uL (ref 0.0–0.7)
Eosinophils Relative: 2 % (ref 0–5)
HCT: 39.1 % (ref 36.0–46.0)
HEMOGLOBIN: 13.3 g/dL (ref 12.0–15.0)
Lymphocytes Relative: 41 % (ref 12–46)
Lymphs Abs: 2.9 10*3/uL (ref 0.7–4.0)
MCH: 31.6 pg (ref 26.0–34.0)
MCHC: 34 g/dL (ref 30.0–36.0)
MCV: 92.9 fL (ref 78.0–100.0)
MPV: 10.5 fL (ref 8.6–12.4)
Monocytes Absolute: 0.5 10*3/uL (ref 0.1–1.0)
Monocytes Relative: 7 % (ref 3–12)
NEUTROS ABS: 3.6 10*3/uL (ref 1.7–7.7)
Neutrophils Relative %: 50 % (ref 43–77)
Platelets: 311 10*3/uL (ref 150–400)
RBC: 4.21 MIL/uL (ref 3.87–5.11)
RDW: 12.7 % (ref 11.5–15.5)
WBC: 7.1 10*3/uL (ref 4.0–10.5)

## 2015-05-07 LAB — COMPREHENSIVE METABOLIC PANEL
ALBUMIN: 4.7 g/dL (ref 3.6–5.1)
ALT: 13 U/L (ref 6–29)
AST: 18 U/L (ref 10–35)
Alkaline Phosphatase: 41 U/L (ref 33–130)
BILIRUBIN TOTAL: 0.4 mg/dL (ref 0.2–1.2)
BUN: 16 mg/dL (ref 7–25)
CO2: 25 mmol/L (ref 20–31)
CREATININE: 0.58 mg/dL (ref 0.50–1.05)
Calcium: 10 mg/dL (ref 8.6–10.4)
Chloride: 104 mmol/L (ref 98–110)
Glucose, Bld: 94 mg/dL (ref 65–99)
Potassium: 4.1 mmol/L (ref 3.5–5.3)
SODIUM: 139 mmol/L (ref 135–146)
TOTAL PROTEIN: 7 g/dL (ref 6.1–8.1)

## 2015-05-07 LAB — TSH: TSH: 2.538 u[IU]/mL (ref 0.350–4.500)

## 2015-05-07 LAB — LIPID PANEL
Cholesterol: 170 mg/dL (ref 125–200)
HDL: 57 mg/dL (ref >=46)
LDL CALC: 87 mg/dL (ref <130)
Total CHOL/HDL Ratio: 3 Ratio (ref <=5.0)
Triglycerides: 130 mg/dL (ref <150)
VLDL: 26 mg/dL (ref <30)

## 2015-05-07 MED ORDER — ESTRADIOL 0.075 MG/24HR TD PTTW: 1.0000 | MEDICATED_PATCH | TRANSDERMAL | Status: DC

## 2015-05-07 NOTE — Patient Instructions (Signed)
Office will call you to arrange the breast cancer genetic counseling appointment  You may obtain a copy of any labs that were done today by logging onto MyChart as outlined in the instructions provided with your AVS (after visit summary). The office will not call with normal lab results but certainly if there are any significant abnormalities then we will contact you.   Health Maintenance Adopting a healthy lifestyle and getting preventive care can go a long way to promote health and wellness. Talk with your health care provider about what schedule of regular examinations is right for you. This is a good chance for you to check in with your provider about disease prevention and staying healthy. In between checkups, there are plenty of things you can do on your own. Experts have done a lot of research about which lifestyle changes and preventive measures are most likely to keep you healthy. Ask your health care provider for more information. WEIGHT AND DIET  Eat a healthy diet  Be sure to include plenty of vegetables, fruits, low-fat dairy products, and lean protein.  Do not eat a lot of foods high in solid fats, added sugars, or salt.  Get regular exercise. This is one of the most important things you can do for your health.  Most adults should exercise for at least 150 minutes each week. The exercise should increase your heart rate and make you sweat (moderate-intensity exercise).  Most adults should also do strengthening exercises at least twice a week. This is in addition to the moderate-intensity exercise.  Maintain a healthy weight  Body mass index (BMI) is a measurement that can be used to identify possible weight problems. It estimates body fat based on height and weight. Your health care provider can help determine your BMI and help you achieve or maintain a healthy weight.  For females 94 years of age and older:   A BMI below 18.5 is considered underweight.  A BMI of 18.5 to  24.9 is normal.  A BMI of 25 to 29.9 is considered overweight.  A BMI of 30 and above is considered obese.  Watch levels of cholesterol and blood lipids  You should start having your blood tested for lipids and cholesterol at 51 years of age, then have this test every 5 years.  You may need to have your cholesterol levels checked more often if:  Your lipid or cholesterol levels are high.  You are older than 51 years of age.  You are at high risk for heart disease.  CANCER SCREENING   Lung Cancer  Lung cancer screening is recommended for adults 68-38 years old who are at high risk for lung cancer because of a history of smoking.  A yearly low-dose CT scan of the lungs is recommended for people who:  Currently smoke.  Have quit within the past 15 years.  Have at least a 30-pack-year history of smoking. A pack year is smoking an average of one pack of cigarettes a day for 1 year.  Yearly screening should continue until it has been 15 years since you quit.  Yearly screening should stop if you develop a health problem that would prevent you from having lung cancer treatment.  Breast Cancer  Practice breast self-awareness. This means understanding how your breasts normally appear and feel.  It also means doing regular breast self-exams. Let your health care provider know about any changes, no matter how small.  If you are in your 20s or 30s, you should  have a clinical breast exam (CBE) by a health care provider every 1-3 years as part of a regular health exam.  If you are 65 or older, have a CBE every year. Also consider having a breast X-ray (mammogram) every year.  If you have a family history of breast cancer, talk to your health care provider about genetic screening.  If you are at high risk for breast cancer, talk to your health care provider about having an MRI and a mammogram every year.  Breast cancer gene (BRCA) assessment is recommended for women who have family  members with BRCA-related cancers. BRCA-related cancers include:  Breast.  Ovarian.  Tubal.  Peritoneal cancers.  Results of the assessment will determine the need for genetic counseling and BRCA1 and BRCA2 testing. Cervical Cancer Routine pelvic examinations to screen for cervical cancer are no longer recommended for nonpregnant women who are considered low risk for cancer of the pelvic organs (ovaries, uterus, and vagina) and who do not have symptoms. A pelvic examination may be necessary if you have symptoms including those associated with pelvic infections. Ask your health care provider if a screening pelvic exam is right for you.   The Pap test is the screening test for cervical cancer for women who are considered at risk.  If you had a hysterectomy for a problem that was not cancer or a condition that could lead to cancer, then you no longer need Pap tests.  If you are older than 65 years, and you have had normal Pap tests for the past 10 years, you no longer need to have Pap tests.  If you have had past treatment for cervical cancer or a condition that could lead to cancer, you need Pap tests and screening for cancer for at least 20 years after your treatment.  If you no longer get a Pap test, assess your risk factors if they change (such as having a new sexual partner). This can affect whether you should start being screened again.  Some women have medical problems that increase their chance of getting cervical cancer. If this is the case for you, your health care provider may recommend more frequent screening and Pap tests.  The human papillomavirus (HPV) test is another test that may be used for cervical cancer screening. The HPV test looks for the virus that can cause cell changes in the cervix. The cells collected during the Pap test can be tested for HPV.  The HPV test can be used to screen women 21 years of age and older. Getting tested for HPV can extend the interval  between normal Pap tests from three to five years.  An HPV test also should be used to screen women of any age who have unclear Pap test results.  After 51 years of age, women should have HPV testing as often as Pap tests.  Colorectal Cancer  This type of cancer can be detected and often prevented.  Routine colorectal cancer screening usually begins at 51 years of age and continues through 51 years of age.  Your health care provider may recommend screening at an earlier age if you have risk factors for colon cancer.  Your health care provider may also recommend using home test kits to check for hidden blood in the stool.  A small camera at the end of a tube can be used to examine your colon directly (sigmoidoscopy or colonoscopy). This is done to check for the earliest forms of colorectal cancer.  Routine screening usually  begins at age 68.  Direct examination of the colon should be repeated every 5-10 years through 51 years of age. However, you may need to be screened more often if early forms of precancerous polyps or small growths are found. Skin Cancer  Check your skin from head to toe regularly.  Tell your health care provider about any new moles or changes in moles, especially if there is a change in a mole's shape or color.  Also tell your health care provider if you have a mole that is larger than the size of a pencil eraser.  Always use sunscreen. Apply sunscreen liberally and repeatedly throughout the day.  Protect yourself by wearing long sleeves, pants, a wide-brimmed hat, and sunglasses whenever you are outside. HEART DISEASE, DIABETES, AND HIGH BLOOD PRESSURE   Have your blood pressure checked at least every 1-2 years. High blood pressure causes heart disease and increases the risk of stroke.  If you are between 48 years and 5 years old, ask your health care provider if you should take aspirin to prevent strokes.  Have regular diabetes screenings. This involves  taking a blood sample to check your fasting blood sugar level.  If you are at a normal weight and have a low risk for diabetes, have this test once every three years after 51 years of age.  If you are overweight and have a high risk for diabetes, consider being tested at a younger age or more often. PREVENTING INFECTION  Hepatitis B  If you have a higher risk for hepatitis B, you should be screened for this virus. You are considered at high risk for hepatitis B if:  You were born in a country where hepatitis B is common. Ask your health care provider which countries are considered high risk.  Your parents were born in a high-risk country, and you have not been immunized against hepatitis B (hepatitis B vaccine).  You have HIV or AIDS.  You use needles to inject street drugs.  You live with someone who has hepatitis B.  You have had sex with someone who has hepatitis B.  You get hemodialysis treatment.  You take certain medicines for conditions, including cancer, organ transplantation, and autoimmune conditions. Hepatitis C  Blood testing is recommended for:  Everyone born from [birth year] through 1967.  Anyone with known risk factors for hepatitis C. Sexually transmitted infections (STIs)  You should be screened for sexually transmitted infections (STIs) including gonorrhea and chlamydia if:  You are sexually active and are younger than 51 years of age.  You are older than 51 years of age and your health care provider tells you that you are at risk for this type of infection.  Your sexual activity has changed since you were last screened and you are at an increased risk for chlamydia or gonorrhea. Ask your health care provider if you are at risk.  If you do not have HIV, but are at risk, it may be recommended that you take a prescription medicine daily to prevent HIV infection. This is called pre-exposure prophylaxis (PrEP). You are considered at risk if:  You are sexually active  and do not regularly use condoms or know the HIV status of your partner(s).  You take drugs by injection.  You are sexually active with a partner who has HIV. Talk with your health care provider about whether you are at high risk of being infected with HIV. If you choose to begin PrEP, you should first be tested for  HIV. You should then be tested every 3 months for as long as you are taking PrEP.  PREGNANCY   If you are premenopausal and you may become pregnant, ask your health care provider about preconception counseling.  If you may become pregnant, take 400 to 800 micrograms (mcg) of folic acid every day.  If you want to prevent pregnancy, talk to your health care provider about birth control (contraception). OSTEOPOROSIS AND MENOPAUSE   Osteoporosis is a disease in which the bones lose minerals and strength with aging. This can result in serious bone fractures. Your risk for osteoporosis can be identified using a bone density scan.  If you are 71 years of age or older, or if you are at risk for osteoporosis and fractures, ask your health care provider if you should be screened.  Ask your health care provider whether you should take a calcium or vitamin D supplement to lower your risk for osteoporosis.  Menopause may have certain physical symptoms and risks.  Hormone replacement therapy may reduce some of these symptoms and risks. Talk to your health care provider about whether hormone replacement therapy is right for you.  HOME CARE INSTRUCTIONS   Schedule regular health, dental, and eye exams.  Stay current with your immunizations.   Do not use any tobacco products including cigarettes, chewing tobacco, or electronic cigarettes.  If you are pregnant, do not drink alcohol.  If you are breastfeeding, limit how much and how often you drink alcohol.  Limit alcohol intake to no more than 1 drink per day for nonpregnant women. One drink equals 12 ounces of beer, 5 ounces of wine,  or 1 ounces of hard liquor.  Do not use street drugs.  Do not share needles.  Ask your health care provider for help if you need support or information about quitting drugs.  Tell your health care provider if you often feel depressed.  Tell your health care provider if you have ever been abused or do not feel safe at home. Document Released: 12/22/2010 Document Revised: 10/23/2013 Document Reviewed: 05/10/2013 [facility] Patient Information 2015 [place], [place]. This information is not intended to replace advice given to you by your health care provider. Make sure you discuss any questions you have with your health care provider.

## 2015-05-07 NOTE — Progress Notes (Signed)
RAVONDA WALDMANN 1966/06/10 [id number]        49 y.o.  G0P0  Patient's last menstrual period was 06/23/2003. for annual exam.  Several issues noted below.  Past medical history,surgical history, problem list, medications, allergies, family history and social history were all reviewed and documented as reviewed in the EPIC chart.  ROS:  Performed with pertinent positives and negatives included in the history, assessment and plan.   Additional significant findings :  none   Exam: Kim Counsellor Vitals:   05/07/15 1456  BP: 120/76  Height: 5\' 5"  (1.651 m)  Weight: 175 lb (79.379 kg)   General appearance:  Normal affect, orientation and appearance. Skin: Grossly normal HEENT: Without gross lesions.  No cervical or supraclavicular adenopathy. Thyroid normal.  Lungs:  Clear without wheezing, rales or rhonchi Cardiac: RR, without RMG Abdominal:  Soft, nontender, without masses, guarding, rebound, organomegaly or hernia Breasts:  Examined lying and sitting without masses, retractions, discharge or axillary adenopathy. Pelvic:  Ext/BUS/vagina normal  Adnexa  Without masses or tenderness    Anus and perineum  Normal   Rectovaginal  Normal sphincter tone without palpated masses or tenderness.    Assessment/Plan:  49 y.o. G0P0 female for annual exam.   1. Postmenopausal/HRT. Status post TAH/BSO 2005 for leiomyoma and endometriosis. On Vivelle 0.075 mg patches doing well wants to continue. Again review the whole issue of HRT to include increased risk of stroke heart attack DVT breast cancer. The ACOG and NAMS statements about weaning also reviewed. Patient's comfortable with continuing and I refilled her 1 year. 2. Family history of breast cancer.  Patient's mother, maternal aunt, sister and sister's daughter all had breast cancer in their 9s. Mother also apparently had ovarian cancer.  I have discussed with the patient multiple times about genetic counseling and screening and my strong  recommendation that she proceed with this as more than likely there is a genetic mutation in her family. She did have an MRI last year and the issues of annual MRIs with annual mammograms also discussed. If genetic positive up to 80% lifetime risk of breast cancer and whether she would proceed with prophylactic mastectomies also discussed. She is status post BSO in the past. Patient now agrees to discuss with genetic counselor. We'll go ahead and help her make that arrangement. Even if she is negative for genetic mutations but they calculated higher lifetime risk them will continue with annual MRIs and annual mammography.  Last mammogram 03/2015. SBE monthly reviewed. 3. Pap smear 2015 negative. No Pap smear done today. No history of abnormal Pap smears. Status post hysterectomy for benign indications. Options to stop screening versus less frequent screening intervals reviewed. 4. Colonoscopy 2010. Repeat at their recommended interval. 5. DEXA 2011 normal. We'll plan repeat further into the menopause.  Check vitamin D level today. 6. Health maintenance. Baseline CBC comprehensive metabolic panel lipid profile urinalysis TSH and vitamin D ordered.  Follow up for genetic counseling. Follow up in one year, sooner as needed.   [name] MD, 3:20 PM 05/07/2015

## 2015-05-08 ENCOUNTER — Ambulatory Visit: Payer: 59 | Admitting: Physical Therapy

## 2015-05-08 DIAGNOSIS — M6281 Muscle weakness (generalized): Secondary | ICD-10-CM

## 2015-05-08 DIAGNOSIS — M25662 Stiffness of left knee, not elsewhere classified: Secondary | ICD-10-CM

## 2015-05-08 DIAGNOSIS — R262 Difficulty in walking, not elsewhere classified: Secondary | ICD-10-CM

## 2015-05-08 LAB — URINALYSIS W MICROSCOPIC + REFLEX CULTURE
Bacteria, UA: NONE SEEN [HPF]
Bilirubin Urine: NEGATIVE
Casts: NONE SEEN [LPF]
Crystals: NONE SEEN [HPF]
Glucose, UA: NEGATIVE
Hgb urine dipstick: NEGATIVE
Ketones, ur: NEGATIVE
Leukocytes, UA: NEGATIVE
Nitrite: NEGATIVE
Protein, ur: NEGATIVE
Specific Gravity, Urine: 1.022 (ref 1.001–1.035)
Yeast: NONE SEEN [HPF]
pH: 6.5 (ref 5.0–8.0)

## 2015-05-08 LAB — VITAMIN D 25 HYDROXY (VIT D DEFICIENCY, FRACTURES): Vit D, 25-Hydroxy: 22 ng/mL — ABNORMAL LOW (ref 30–100)

## 2015-05-08 NOTE — Therapy (Signed)
[place], [place], [address] Phone: [phone]   Fax:  [phone]  Physical Therapy Treatment  Patient Details  Name: Nicole Oliver MRN: [id number] Date of Birth: 1965/10/05 No Data Recorded  Encounter Date: 05/08/2015      PT End of Session - 05/08/15 1515    Visit Number 22   Number of Visits 25   Date for PT Re-Evaluation 05/15/15   PT Start Time 1415   PT Stop Time 1500   PT Time Calculation (min) 45 min      Past Medical History  Diagnosis Date  . Anxiety   . ACL tear 12/2014    left  . Dental crown present   . Hyperlipidemia     Past Surgical History  Procedure Laterality Date  . Nasal reconstruction with septal repair    . Total abdominal hysterectomy w/ bilateral salpingoophorectomy  06/25/2003  . Strabismus surgery    . Knee arthroscopy with anterior cruciate ligament (acl) repair Left 01/10/2015    Procedure: KNEE ARTHROSCOPY WITH ANTERIOR CRUCIATE LIGAMENT (ACL) RECONSTRUCTION WITH BONE-TENDON-BONE (PATELLAR TENDON) ALLOGRAFT.    ARTHROSCOPIC ACL RECONSTRUCTION WITH BONE-TENDON-BONE (PATELLAR TENDON) ALLOGRAFT, 5\' 5" , 170#.;  Surgeon: Garald Balding, MD;  Location: [facility];  Service: Orthopedics;  Laterality: Left;  ARTHROSCOPIC ACL RECONSTRUCTION WITH BONE-TENDON-BONE (  . Abdominal hysterectomy      TAH BSO    There were no vitals filed for this visit.  Visit Diagnosis:  Muscle weakness of lower extremity  Difficulty in walking  Joint stiffness of knee, left      Subjective Assessment - 05/08/15 1423    Subjective Mild pain.  Typical for me.  Steps still a challange.    Started going outside with out the cane.  Yard, uneven terrain.  She uses extra time and caution.                          [facility] PT Treatment/Exercise - 05/08/15 1436    Ambulation/Gait   Ambulation/Gait --  Stairs and stairs pre gait activities   Gait  Comments able to go step over step  3 X 4 steps.     Knee/Hip Exercises: Aerobic   Stationary Bike 10 minutes 5 at 1.5 5 at 1.7   Knee/Hip Exercises: Machines for Strengthening   Cybex Knee Extension 2 plates 30 tostraight   Cybex Knee Flexion     Cybex Leg Press 1 plate, 2legs extended, 1 leg shortened   Knee/Hip Exercises: Standing   Step Down Limitations 4 and 6 inches 1 hand hold.  , able to do   Other Standing Knee Exercises sit to stand with eccentric emphasis                  PT Short Term Goals - 04/16/15 1257    PT SHORT TERM GOAL #1   Title be independent in initial HEP   Status Achieved   PT SHORT TERM GOAL #2   Title demonstrate Lt knee AROM flexion to 90 degrees to allow for sitting without substitution   Status Achieved   PT SHORT TERM GOAL #3   Title use 1 crutch for all ambulation wearing hinged brace   Status Achieved   PT SHORT TERM GOAL #4   Title Left knee extension improved to 6 degrees needed for greater ease with ambulation for return to work.   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  Able to descend  steps today.   PT Next Visit Plan continue eccentric strengthening   PT Home Exercise Plan stand to sit slowly to exercise intermittantly during the day when she sits in her chair   Consulted and Agree with Plan of Care Patient        Problem List Patient Active Problem List   Diagnosis Date Noted  . ACL (anterior cruciate ligament) tear 01/10/2015  . Finger pain 04/13/2012    Nicole Oliver 05/08/2015, 4:59 PM  [facility] [address] [place], [place], [address] Phone: [phone]   Fax:  [phone]  Name: Nicole Oliver MRN: [id number] Date of Birth: 1966-05-06    Melvenia Needles, PTA 05/08/2015 4:59 PM Phone: [phone] Fax: [phone]

## 2015-05-09 LAB — URINE CULTURE

## 2015-05-10 ENCOUNTER — Other Ambulatory Visit: Payer: Self-pay | Admitting: Gynecology

## 2015-05-10 DIAGNOSIS — E559 Vitamin D deficiency, unspecified: Secondary | ICD-10-CM

## 2015-05-13 ENCOUNTER — Ambulatory Visit: Payer: 59 | Admitting: Physical Therapy

## 2015-05-13 DIAGNOSIS — R262 Difficulty in walking, not elsewhere classified: Secondary | ICD-10-CM

## 2015-05-13 DIAGNOSIS — M6281 Muscle weakness (generalized): Secondary | ICD-10-CM

## 2015-05-13 DIAGNOSIS — M25662 Stiffness of left knee, not elsewhere classified: Secondary | ICD-10-CM

## 2015-05-13 NOTE — Therapy (Signed)
[place], [place], [address] Phone: [phone]   Fax:  [phone]  Physical Therapy Treatment  Patient Details  Name: Nicole Oliver MRN: [id number] Date of Birth: 20-Sep-1965 No Data Recorded  Encounter Date: 05/13/2015      PT End of Session - 05/13/15 1519    Visit Number 23   Number of Visits 25   Date for PT Re-Evaluation 05/15/15   PT Start Time 1420   PT Stop Time 1500   PT Time Calculation (min) 40 min   Activity Tolerance Patient tolerated treatment well;No increased pain   Behavior During Therapy [facility] for tasks assessed/performed      Past Medical History  Diagnosis Date  . Anxiety   . ACL tear 12/2014    left  . Dental crown present   . Hyperlipidemia     Past Surgical History  Procedure Laterality Date  . Nasal reconstruction with septal repair    . Total abdominal hysterectomy w/ bilateral salpingoophorectomy  06/25/2003  . Strabismus surgery    . Knee arthroscopy with anterior cruciate ligament (acl) repair Left 01/10/2015    Procedure: KNEE ARTHROSCOPY WITH ANTERIOR CRUCIATE LIGAMENT (ACL) RECONSTRUCTION WITH BONE-TENDON-BONE (PATELLAR TENDON) ALLOGRAFT.    ARTHROSCOPIC ACL RECONSTRUCTION WITH BONE-TENDON-BONE (PATELLAR TENDON) ALLOGRAFT, 5\' 5" , 170#.;  Surgeon: Garald Balding, MD;  Location: [place];  Service: Orthopedics;  Laterality: Left;  ARTHROSCOPIC ACL RECONSTRUCTION WITH BONE-TENDON-BONE (  . Abdominal hysterectomy      TAH BSO    There were no vitals filed for this visit.  Visit Diagnosis:  Muscle weakness of lower extremity  Difficulty in walking  Joint stiffness of knee, left      Subjective Assessment - 05/13/15 1502    Subjective No pain.  Still not using a cane.    Currently in Pain? No/denies                         [facility] Adult PT Treatment/Exercise - 05/13/15 1420    High Level Balance   High Level Balance  Comments heel toe walking, static stand with head movements eyes open and closed.  a challange.     Knee/Hip Exercises: Standing   Heel Raises 10 reps   Step Down Limitations 6 inches 10 X   Wall Squat Limitations 10 X 2-3 seconds 10 X   Other Standing Knee Exercises bent knee stand 3 way leg movements.     Other Standing Knee Exercises sit to stand 10 X   Ankle Exercises: Machines for Strengthening   Cybex Leg Press --  2, 1 plates and legs with eccentric focus.                    PT Short Term Goals - 04/16/15 1257    PT SHORT TERM GOAL #1   Title be independent in initial HEP   Status Achieved   PT SHORT TERM GOAL #2   Title demonstrate Lt knee AROM flexion to 90 degrees to allow for sitting without substitution   Status Achieved   PT SHORT TERM GOAL #3   Title use 1 crutch for all ambulation wearing hinged brace   Status Achieved   PT SHORT TERM GOAL #4   Title Left knee extension improved to 6 degrees needed for greater ease with ambulation for return to work.   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  Strength focus        Problem List Patient Active Problem List   Diagnosis Date Noted  . ACL (anterior cruciate ligament) tear 01/10/2015  . Finger pain 04/13/2012    Shomari Scicchitano 05/13/2015, 4:27 PM  [facility] [address] [place], [place], [address] Phone: [phone]   Fax:  [phone]  Name: Nicole Oliver MRN: [id number] Date of Birth: 1965-10-31    Melvenia Needles, PTA 05/13/2015 4:27 PM Phone: [phone] Fax: [phone]

## 2015-05-15 ENCOUNTER — Ambulatory Visit: Payer: 59 | Admitting: Physical Therapy

## 2015-05-15 DIAGNOSIS — R262 Difficulty in walking, not elsewhere classified: Secondary | ICD-10-CM

## 2015-05-15 DIAGNOSIS — M25662 Stiffness of left knee, not elsewhere classified: Secondary | ICD-10-CM

## 2015-05-15 DIAGNOSIS — M6281 Muscle weakness (generalized): Secondary | ICD-10-CM

## 2015-05-15 NOTE — Therapy (Signed)
[place], [place], [address] Phone: [phone]   Fax:  [phone]  Physical Therapy Treatment  Patient Details  Name: Nicole Oliver MRN: [id number] Date of Birth: April 19, 1966 No Data Recorded  Encounter Date: 05/15/2015      PT End of Session - 05/15/15 1517    Visit Number 24   Number of Visits 25   Date for PT Re-Evaluation 05/15/15   PT Start Time 1419   PT Stop Time 1500   PT Time Calculation (min) 41 min   Activity Tolerance Patient tolerated treatment well;No increased pain   Behavior During Therapy [facility] for tasks assessed/performed      Past Medical History  Diagnosis Date  . Anxiety   . ACL tear 12/2014    left  . Dental crown present   . Hyperlipidemia     Past Surgical History  Procedure Laterality Date  . Nasal reconstruction with septal repair    . Total abdominal hysterectomy w/ bilateral salpingoophorectomy  06/25/2003  . Strabismus surgery    . Knee arthroscopy with anterior cruciate ligament (acl) repair Left 01/10/2015    Procedure: KNEE ARTHROSCOPY WITH ANTERIOR CRUCIATE LIGAMENT (ACL) RECONSTRUCTION WITH BONE-TENDON-BONE (PATELLAR TENDON) ALLOGRAFT.    ARTHROSCOPIC ACL RECONSTRUCTION WITH BONE-TENDON-BONE (PATELLAR TENDON) ALLOGRAFT, 5\' 5" , 170#.;  Surgeon: Garald Balding, MD;  Location: [place];  Service: Orthopedics;  Laterality: Left;  ARTHROSCOPIC ACL RECONSTRUCTION WITH BONE-TENDON-BONE (  . Abdominal hysterectomy      TAH BSO    There were no vitals filed for this visit.  Visit Diagnosis:  Muscle weakness of lower extremity  Difficulty in walking  Joint stiffness of knee, left                       [facility] Adult PT Treatment/Exercise - 05/15/15 1419    High Level Balance   High Level Balance Comments as previous practiced with some improvements,  2 leg balance on BOSU, mini squats on BOSU.   SBA.  Static and dynamic ,  compliant and non compliant surface.   Knee/Hip Exercises: Aerobic   Stationary Bike Nustep  L5 6 minutes   Ankle Exercises: Machines for Strengthening   Cybex Leg Press 1 and 2 legs  1 and 2 plates.    Cybex hip 1.5 and 2 plates ,20 X abduction and extension , b                  PT Short Term Goals - 04/16/15 1257    PT SHORT TERM GOAL #1   Title be independent in initial HEP   Status Achieved   PT SHORT TERM GOAL #2   Title demonstrate Lt knee AROM flexion to 90 degrees to allow for sitting without substitution   Status Achieved   PT SHORT TERM GOAL #3   Title use 1 crutch for all ambulation wearing hinged brace   Status Achieved   PT SHORT TERM GOAL #4   Title Left knee extension improved to 6 degrees needed for greater ease with ambulation for return to work.   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  1 more visit in [place], Still more time in POC.She is not able to go up her inside steps at home inside, much taller   PT Next Visit Plan try taller steps in clinic 8?   PT Home Exercise Plan Balance, strength   Consulted  and Agree with Plan of Care Patient        Problem List Patient Active Problem List   Diagnosis Date Noted  . ACL (anterior cruciate ligament) tear 01/10/2015  . Finger pain 04/13/2012    HARRIS,KAREN 05/15/2015, 3:20 PM  [facility] [address] [place], [place], [address] Phone: [phone]   Fax:  [phone]  Name: Nicole Oliver MRN: [id number] Date of Birth: [birth year]    Melvenia Needles, PTA 05/15/2015 3:20 PM Phone: [phone] Fax: [phone]

## 2015-05-21 ENCOUNTER — Ambulatory Visit: Payer: 59 | Admitting: Physical Therapy

## 2015-05-21 DIAGNOSIS — M25662 Stiffness of left knee, not elsewhere classified: Secondary | ICD-10-CM

## 2015-05-21 DIAGNOSIS — S83512A Sprain of anterior cruciate ligament of left knee, initial encounter: Secondary | ICD-10-CM

## 2015-05-21 DIAGNOSIS — R262 Difficulty in walking, not elsewhere classified: Secondary | ICD-10-CM

## 2015-05-21 DIAGNOSIS — M6281 Muscle weakness (generalized): Secondary | ICD-10-CM

## 2015-05-21 DIAGNOSIS — M7989 Other specified soft tissue disorders: Secondary | ICD-10-CM

## 2015-05-22 NOTE — Patient Instructions (Signed)
Step down eccentric lowering 5 reps at least 1x/day.  Review of HEP and safe self progression

## 2015-05-22 NOTE — Therapy (Signed)
[place], [place], [address] Phone: [phone]   Fax:  [phone]  Physical Therapy Recertification/Discharge Summary  Patient Details  Name: Nicole Oliver MRN: [id number] Date of Birth: 1966-01-06 No Data Recorded  Encounter Date: 05/21/2015      PT End of Session - 05/21/15 1404    Visit Number 25   Number of Visits 25   Date for PT Re-Evaluation 05/21/15   Authorization Type [facility]   PT Start Time 1330   PT Stop Time 1415   PT Time Calculation (min) 45 min   Activity Tolerance Patient tolerated treatment well      Past Medical History  Diagnosis Date  . Anxiety   . ACL tear 12/2014    left  . Dental crown present   . Hyperlipidemia     Past Surgical History  Procedure Laterality Date  . Nasal reconstruction with septal repair    . Total abdominal hysterectomy w/ bilateral salpingoophorectomy  06/25/2003  . Strabismus surgery    . Knee arthroscopy with anterior cruciate ligament (acl) repair Left 01/10/2015    Procedure: KNEE ARTHROSCOPY WITH ANTERIOR CRUCIATE LIGAMENT (ACL) RECONSTRUCTION WITH BONE-TENDON-BONE (PATELLAR TENDON) ALLOGRAFT.    ARTHROSCOPIC ACL RECONSTRUCTION WITH BONE-TENDON-BONE (PATELLAR TENDON) ALLOGRAFT, _0 , 170#.;  Surgeon: Garald Balding, MD;  Location: [place];  Service: Orthopedics;  Laterality: Left;  ARTHROSCOPIC ACL RECONSTRUCTION WITH BONE-TENDON-BONE (  . Abdominal hysterectomy      TAH BSO    There were no vitals filed for this visit.  Visit Diagnosis:  Muscle weakness of lower extremity - Plan: PT plan of care cert/re-cert  Difficulty in walking - Plan: PT plan of care cert/re-cert  Joint stiffness of knee, left - Plan: PT plan of care cert/re-cert  Swelling of limb - Plan: PT plan of care cert/re-cert  ACL tear, left, initial encounter - Plan: PT plan of care cert/re-cert      Subjective Assessment - 05/21/15 1330    Subjective I still have problems on the steps (descending).  Every once in a while the sides of my joints hurt.  The last time I saw the doctor, he said no follow up needed for 6 months.  MD said to continue PT as long as medically necessary.  Have gone back to work full time.  Using bike at home.  Able to walk without assistive device but at times I feel off balance.  I  still take Advil every day.     Currently in Pain? No/denies   Pain Score 0-No pain   Pain Location Knee   Pain Orientation Left   Aggravating Factors  descending steps; standing too long            [facility] PT Assessment - 05/21/15 1341    Observation/Other Assessments   Focus on Therapeutic Outcomes (FOTO)  44%   AROM   Left Knee Extension 0   Left Knee Flexion 135   PROM   Left Knee Extension 0   Left Knee Flexion 140   Strength   Left Hip ABduction 5/5   Left Knee Flexion 4+/5   Left Knee Extension 4/5                     [facility] Adult PT Treatment/Exercise - 05/21/15 1340    Knee/Hip Exercises: Aerobic   Stationary Bike 8 min   [place] .75 miles speed >2.0 mph   Knee/Hip Exercises: Standing  Step Down 1 set;5 reps   Other Standing Knee Exercises up and down steps 3x                 PT Education - 05/22/15 0723    Education provided Yes   Education Details Discussion of safe self progression of HEP   Person(s) Educated Patient   Methods Explanation;Demonstration   Comprehension Verbalized understanding;Returned demonstration          PT Short Term Goals - 05/21/15 1336    PT SHORT TERM GOAL #1   Title be independent in initial HEP   Status Achieved   PT SHORT TERM GOAL #2   Title demonstrate Lt knee AROM flexion to 90 degrees to allow for sitting without substitution   Status Achieved   PT SHORT TERM GOAL #3   Title use 1 crutch for all ambulation wearing hinged brace   Status Achieved   PT SHORT TERM GOAL #4   Title Left knee extension improved to 6 degrees needed  for greater ease with ambulation for return to work.   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    Her AROM is good:  0-135 degrees.  Quad strength 4/5 with decreased motor weakness with descending steps.  HS 4+/5.  She is able to walk community distances without an assistive device.  She remains highly compliant with her HEP and feels ready to continue on her own to address her quad motor control deficits.  Her FOTO functional outcome score improved from 61% limitation to 44%.  She has met all short term and long term rehab goals.         PHYSICAL THERAPY DISCHARGE SUMMARY  Visits from Start of Care: 25  Current functional level related to goals / functional outcomes: See clinical impressions above   Remaining deficits: See above   Education / Equipment: HEP progression  Plan: Patient agrees to discharge.  Patient goals were met. Patient is being discharged due to meeting the stated rehab goals.  ?????         Problem List Patient Active Problem List   Diagnosis Date Noted  . ACL (anterior cruciate ligament)  tear 01/10/2015  . Finger pain 04/13/2012  Ruben Im, PT 05/22/2015 7:28 AM Phone: [phone] Fax: [phone]  [name] 05/22/2015,  7:[facility] [place], [place], [address] Phone: [phone]   Fax:  [phone]  Name: Nicole Oliver MRN: [id number] Date of Birth: [birth year]

## 2015-05-23 ENCOUNTER — Ambulatory Visit: Payer: 59 | Admitting: Physical Therapy

## 2015-05-27 ENCOUNTER — Encounter: Payer: Self-pay | Admitting: Physical Therapy

## 2015-05-29 ENCOUNTER — Encounter: Payer: Self-pay | Admitting: Physical Therapy

## 2015-06-04 ENCOUNTER — Encounter: Payer: Self-pay | Admitting: Physical Therapy

## 2015-06-06 ENCOUNTER — Encounter: Payer: Self-pay | Admitting: Physical Therapy

## 2015-06-10 ENCOUNTER — Encounter: Payer: Self-pay | Admitting: Physical Therapy

## 2015-06-12 ENCOUNTER — Ambulatory Visit (INDEPENDENT_AMBULATORY_CARE_PROVIDER_SITE_OTHER): Payer: 59 | Admitting: Gynecology

## 2015-06-12 ENCOUNTER — Encounter: Payer: Self-pay | Admitting: Physical Therapy

## 2015-06-12 ENCOUNTER — Encounter: Payer: Self-pay | Admitting: Gynecology

## 2015-06-12 VITALS — BP 116/76

## 2015-06-12 DIAGNOSIS — N200 Calculus of kidney: Secondary | ICD-10-CM | POA: Diagnosis not present

## 2015-06-12 DIAGNOSIS — R319 Hematuria, unspecified: Secondary | ICD-10-CM

## 2015-06-12 LAB — URINALYSIS W MICROSCOPIC + REFLEX CULTURE
BACTERIA UA: NONE SEEN [HPF]
Bilirubin Urine: NEGATIVE
Casts: NONE SEEN [LPF]
GLUCOSE, UA: NEGATIVE
Ketones, ur: NEGATIVE
LEUKOCYTES UA: NEGATIVE
NITRITE: NEGATIVE
PH: 7 (ref 5.0–8.0)
Protein, ur: NEGATIVE
SPECIFIC GRAVITY, URINE: 1.02 (ref 1.001–1.035)
WBC UA: NONE SEEN WBC/HPF (ref <=5)
YEAST: NONE SEEN [HPF]

## 2015-06-12 NOTE — Progress Notes (Signed)
Nicole Oliver Mar 13, 1966 [id number]        49 y.o.  G0P0 presents having passed a kidney stone approximately 1 month ago. This past weekend she passed a another stone. Notes that she did have blood in her urine at that point and on the tissue when she wiped. Feels now that her bladder is sensitive and uncomfortable with some urgency to urinate. No fever or chills. Does have a history 10 years ago with kidney stone having seen Dr. Diona Fanti historically.  Past medical history,surgical history, problem list, medications, allergies, family history and social history were all reviewed and documented in the EPIC chart.  Directed ROS with pertinent positives and negatives documented in the history of present illness/assessment and plan.  Exam: Filed Vitals:   06/12/15 1132  BP: 116/76   General appearance:  Normal Spine straight without CVA tenderness Abdomen soft nontender without masses guarding rebound  Urinalysis shows 6-10 squamous cells, 3-10 RBCs, no bacteria or white cells.  Assessment/Plan:  49 y.o. G0P0 with history of kidney stones. We'll follow up culture on her urine to make sure she does not have a UTI associated with this. Recommend follow up with urology now she is going to arrange that.    [name] MD, 11:48 AM 06/12/2015

## 2015-06-12 NOTE — Addendum Note (Signed)
Addended by: Nelva Nay on: 06/12/2015 12:02 PM   Modules accepted: Orders

## 2015-06-12 NOTE — Patient Instructions (Signed)
Follow up with the urologists as arranged.

## 2015-06-13 ENCOUNTER — Telehealth: Payer: Self-pay | Admitting: *Deleted

## 2015-06-13 NOTE — Telephone Encounter (Signed)
Referral faxed the will fax me back with time and date to relay to patient.

## 2015-06-13 NOTE — Telephone Encounter (Signed)
-----   Message from [name], MD sent at 06/12/2015 11:49 AM EST ----- Patient needs appointment with urology reference kidney stones. Saw Dr. Diona Fanti 10 years ago. Prefer sooner appointment as possible if needs to be different doctor

## 2015-06-14 LAB — URINE CULTURE: Colony Count: 50000

## 2015-06-18 ENCOUNTER — Encounter: Payer: Self-pay | Admitting: Physical Therapy

## 2015-06-19 ENCOUNTER — Telehealth: Payer: Self-pay

## 2015-06-19 NOTE — Telephone Encounter (Signed)
I called patient with result below. She said to tell you that the very next day she was in terrible pain and went to [facility]. They did a CT scan in their office and she had a kidney stone. She has since passed it but had a rough Christmas time because of it.  She said to let you know and to also let you know she will have follow up with them.

## 2015-06-19 NOTE — Telephone Encounter (Signed)
-----   Message from [name], MD sent at 06/17/2015 11:55 AM EST ----- Tell patient she has some microscopic blood in his urine.  Recommend recheck CC UA.  If continues she will need to see a urologist

## 2015-06-20 ENCOUNTER — Encounter: Payer: Self-pay | Admitting: Physical Therapy

## 2015-06-27 MED FILL — ALPRAZolam 0.5 MG TABS: 0.5 | 30 days supply | Qty: 60 | Fill #0

## 2015-07-04 NOTE — Telephone Encounter (Signed)
Appointment was on 06/12/16

## 2015-07-29 MED FILL — ESTRADIOL 0.075 MG PATCH: 0.075 | 28 days supply | Qty: 8 | Fill #0

## 2015-07-30 ENCOUNTER — Other Ambulatory Visit: Payer: Self-pay | Admitting: Gynecology

## 2015-07-30 MED FILL — FLUCONAZOLE 150 MG TABLET: 150 | 5 days supply | Qty: 5 | Fill #0

## 2015-07-30 NOTE — Telephone Encounter (Signed)
Diflucan 150 mg #1 with 5 refills

## 2015-07-30 NOTE — Telephone Encounter (Signed)
I received a refill request for Diflucan. Call patient and find out what's going on.

## 2015-07-30 NOTE — Telephone Encounter (Signed)
Dr. Loetta Rough- You originally prescribed on 05/06/14 and you wrote at visit "History of recurrent yeast infections. Treats herself with Diflucan once monthly which works from a suppressive standpoint. Has done this for years per Dr. Cherylann Banas. Diflucan 150 mg #5 with 1 refill provided."

## 2015-08-07 ENCOUNTER — Other Ambulatory Visit: Payer: 59

## 2015-09-06 ENCOUNTER — Other Ambulatory Visit: Payer: Self-pay | Admitting: Gynecology

## 2015-09-06 DIAGNOSIS — N6320 Unspecified lump in the left breast, unspecified quadrant: Secondary | ICD-10-CM

## 2015-10-08 ENCOUNTER — Ambulatory Visit
Admission: RE | Admit: 2015-10-08 | Discharge: 2015-10-08 | Disposition: A | Discharge status: Home / Self Care | Payer: 59 | Source: Ambulatory Visit | Attending: Gynecology | Admitting: Gynecology

## 2015-10-08 DIAGNOSIS — N63 Unspecified lump in breast: Secondary | ICD-10-CM | POA: Diagnosis not present

## 2015-10-08 DIAGNOSIS — N6320 Unspecified lump in the left breast, unspecified quadrant: Secondary | ICD-10-CM

## 2015-10-09 ENCOUNTER — Telehealth: Payer: Self-pay

## 2015-10-09 ENCOUNTER — Other Ambulatory Visit: Payer: Self-pay | Admitting: Gynecology

## 2015-10-09 DIAGNOSIS — N63 Unspecified lump in unspecified breast: Secondary | ICD-10-CM

## 2015-10-09 DIAGNOSIS — R922 Inconclusive mammogram: Secondary | ICD-10-CM

## 2015-10-09 DIAGNOSIS — Z1239 Encounter for other screening for malignant neoplasm of breast: Secondary | ICD-10-CM

## 2015-10-09 NOTE — Telephone Encounter (Signed)
Patient had breast imaging yesterday and radiologist recommended MRI. He told her she could call our office this morning to expedite. We will need order.

## 2015-10-09 NOTE — Telephone Encounter (Signed)
MRI of breast.  I need to know regarding contrast?

## 2015-10-09 NOTE — Telephone Encounter (Signed)
#  1 Okay to arrange MRI of the breast reference strong family history of breast and ovarian cancer  #2 We had discussed genetic counseling with the oncology counselors and she had agreed to this but I do not see where she followed up for this. Check with her about this and if she is interested schedule and appointment with [facility] oncology genetic counselor to discuss possible genetic testing.

## 2015-10-09 NOTE — Telephone Encounter (Signed)
Ask the radiologist. It's a screening MRI for family history of breast cancer

## 2015-10-09 NOTE — Telephone Encounter (Signed)
I spoke with Nicole Oliver in MRI and she said it should be ordered MRI Bilat w and w/o contrast.  Order placed and I spoke with patient and she is going to call to schedule or they will call her. They want to speak with her for screening questions.  Patient did agree she should not genetic counseling and I forwarded that to jennifer to handle referral. Patient knows she will hear from her.

## 2015-10-10 ENCOUNTER — Telehealth: Payer: Self-pay | Admitting: *Deleted

## 2015-10-10 DIAGNOSIS — Z803 Family history of malignant neoplasm of breast: Secondary | ICD-10-CM

## 2015-10-10 DIAGNOSIS — Z8041 Family history of malignant neoplasm of ovary: Secondary | ICD-10-CM

## 2015-10-10 NOTE — Telephone Encounter (Signed)
Per staff message "schedule and appointment with [facility] oncology genetic counselor to discuss possible genetic testing strong family history of breast and ovarian cancer" eferral will be placed and they will contact pt to schedule.

## 2015-10-16 ENCOUNTER — Ambulatory Visit
Admission: RE | Admit: 2015-10-16 | Discharge: 2015-10-16 | Disposition: A | Discharge status: Home / Self Care | Payer: 59 | Source: Ambulatory Visit | Attending: Gynecology | Admitting: Gynecology

## 2015-10-16 DIAGNOSIS — Z803 Family history of malignant neoplasm of breast: Secondary | ICD-10-CM | POA: Diagnosis not present

## 2015-10-16 DIAGNOSIS — N63 Unspecified lump in unspecified breast: Secondary | ICD-10-CM

## 2015-10-16 DIAGNOSIS — Z1239 Encounter for other screening for malignant neoplasm of breast: Secondary | ICD-10-CM

## 2015-10-16 DIAGNOSIS — N6011 Diffuse cystic mastopathy of right breast: Secondary | ICD-10-CM | POA: Diagnosis not present

## 2015-10-16 DIAGNOSIS — N6012 Diffuse cystic mastopathy of left breast: Secondary | ICD-10-CM | POA: Diagnosis not present

## 2015-10-16 MED ORDER — GADOBENATE DIMEGLUMINE 529 MG/ML IV SOLN
14.0000 mL | Freq: Once | INTRAVENOUS | Status: AC | PRN
  Administered 2015-10-16: 14 mL via INTRAVENOUS

## 2015-10-21 ENCOUNTER — Telehealth: Payer: Self-pay | Admitting: Genetic Counselor

## 2015-10-21 DIAGNOSIS — S83512D Sprain of anterior cruciate ligament of left knee, subsequent encounter: Secondary | ICD-10-CM | POA: Diagnosis not present

## 2015-10-21 MED FILL — FENOFIBRATE 160 MG TABLET: 160 | 90 days supply | Qty: 90 | Fill #1

## 2015-10-21 MED FILL — ESTRADIOL 0.075 MG PATCH: 0.075 | 28 days supply | Qty: 8 | Fill #1

## 2015-10-21 MED FILL — FLUCONAZOLE 150 MG TABLET: 150 | 5 days supply | Qty: 5 | Fill #1

## 2015-10-21 NOTE — Telephone Encounter (Signed)
Lt mess regarding genetic counseling referral °

## 2015-10-25 MED FILL — ALPRAZolam 0.5 MG TABS: 0.5 | 30 days supply | Qty: 60 | Fill #0

## 2015-10-29 NOTE — Telephone Encounter (Signed)
Cancer center tried to call pt to schedule, pt has yet to schedule. I called pt and asked her to call and schedule.

## 2015-11-03 ENCOUNTER — Telehealth: Payer: 59 | Admitting: Physician Assistant

## 2015-11-03 DIAGNOSIS — B9689 Other specified bacterial agents as the cause of diseases classified elsewhere: Secondary | ICD-10-CM

## 2015-11-03 DIAGNOSIS — J019 Acute sinusitis, unspecified: Secondary | ICD-10-CM | POA: Diagnosis not present

## 2015-11-03 MED ORDER — AMOXICILLIN-POT CLAVULANATE 875-125 MG PO TABS: 1.0000 | ORAL_TABLET | Freq: Two times a day (BID) | ORAL | Status: DC

## 2015-11-03 NOTE — Progress Notes (Signed)

## 2015-11-05 ENCOUNTER — Ambulatory Visit: Payer: Self-pay | Admitting: Family Medicine

## 2015-11-12 ENCOUNTER — Ambulatory Visit: Payer: Self-pay | Admitting: Family Medicine

## 2015-11-13 ENCOUNTER — Encounter: Payer: Self-pay | Admitting: Family Medicine

## 2015-11-13 ENCOUNTER — Ambulatory Visit (INDEPENDENT_AMBULATORY_CARE_PROVIDER_SITE_OTHER): Payer: 59 | Admitting: Family Medicine

## 2015-11-13 VITALS — BP 144/87 | HR 90 | Ht 65.0 in | Wt 165.0 lb

## 2015-11-13 DIAGNOSIS — S9031XA Contusion of right foot, initial encounter: Secondary | ICD-10-CM

## 2015-11-13 NOTE — Progress Notes (Signed)
Nicole Oliver - 50 y.o. female MRN [id number]  Date of birth: 13-May-1966  CC: Right plantar heel numbness  SUBJECTIVE:   HPI Nicole Oliver is a very pleasant 50 year old female who presents with 1 month of numbness on the plantar aspect of her right foot. She does not recall any specific injury. She denies any pain. She is otherwise feeling well. She did tear her anterior cruciate ligament 10 months ago and was in rehabilitation until 6 months ago. This was on the left side but she does recall putting more pressure on the right side since that time. She is active but does not do any specific exercise. She does not remember any trauma to that area. No other numbness. No joint pain. Denies any weakness. No back pain. No radiating symptoms. Occasionally she does have a tingle in her distal right foot when she steps awkwardly but this is about 3 times a month. No previous treatment.   ROS:     As above no fevers chills or night sweats. No abnormal weight loss. No joint pain or swelling. No weakness.   HISTORY: Past Medical, Surgical, Social, and Family History Reviewed & Updated per EMR.  Pertinent Historical Findings include: Anterior cruciate ligament tear in July 2016 with repair, hyperlipidemia. Nonsmoker  OBJECTIVE: BP 144/87 mmHg  Pulse 90  Ht 5\' 5"  (1.651 m)  Wt 165 lb (74.844 kg)  BMI 27.46 kg/m2  LMP 06/23/2003  Physical Exam Calm, no acute distress Nonlabored breathing  Ankle:right No visible erythema or swelling. Range of motion is full in all directions. Strength is 5/5 in all directions. Stable lateral and medial ligaments; squeeze test and kleiger test unremarkable; Talar dome nontender; No pain at base of 5th MT; No tenderness over cuboid; No tenderness over N spot or navicular prominence No tenderness on posterior aspects of lateral and medial malleolus No sign of peroneal tendon subluxations; Negative tarsal tunnel tinel's Able to walk 4 steps. Able to stand on toes  of time on the right without issue.  Numbness localized to the plantar aspect of the heel with a paper clip.    MEDICATIONS, LABS & OTHER ORDERS: Previous Medications   ALPRAZOLAM (XANAX) 0.25 MG TABLET    Take 0.25 mg by mouth at bedtime.    CHOLECALCIFEROL (VITAMIN D) 1000 UNITS TABLET    Take 4,000 Units by mouth daily.   ESTRADIOL (VIVELLE-DOT) 0.075 MG/24HR    Place 1 patch onto the skin 2 (two) times a week.   FENOFIBRATE 160 MG TABLET    Take 160 mg by mouth daily.   FLUCONAZOLE (DIFLUCAN) 150 MG TABLET    TAKE 1 TABLET (150 MG TOTAL) BY MOUTH ONCE AS NEEDED FOR YEAST   IBUPROFEN (ADVIL,MOTRIN) 200 MG TABLET    Take 3 tablets (600 mg total) by mouth every 8 (eight) hours as needed for mild pain or moderate pain.   Modified Medications   No medications on file   New Prescriptions   No medications on file   Discontinued Medications   ALPRAZOLAM (XANAX) 0.5 MG TABLET       AMOXICILLIN-CLAVULANATE (AUGMENTIN) 875-125 MG TABLET    Take 1 tablet by mouth 2 (two) times daily.  No orders of the defined types were placed in this encounter.   ASSESSMENT & PLAN: Possible medial plantar cutaneous nerve entrapment versus tarsal tunnel syndrome, although very mild: Possibly unknown trauma or secondary to increased weight bearing after left anterior cruciate ligament surgery. 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 Also recommended 100 mg vitamin B6 daily. See her back in 2 months, sooner if she has any progression of her symptoms. Call with any questions or concerns.

## 2015-12-02 MED FILL — ESTRADIOL 0.075 MG PATCH: 0.075 | 84 days supply | Qty: 24 | Fill #2

## 2016-02-27 ENCOUNTER — Ambulatory Visit (INDEPENDENT_AMBULATORY_CARE_PROVIDER_SITE_OTHER): Payer: 59

## 2016-02-27 ENCOUNTER — Ambulatory Visit (INDEPENDENT_AMBULATORY_CARE_PROVIDER_SITE_OTHER): Payer: 59 | Admitting: Podiatry

## 2016-02-27 ENCOUNTER — Encounter: Payer: Self-pay | Admitting: Podiatry

## 2016-02-27 VITALS — BP 157/91 | HR 89 | Resp 16 | Ht 65.0 in | Wt 165.0 lb

## 2016-02-27 DIAGNOSIS — M79671 Pain in right foot: Secondary | ICD-10-CM

## 2016-02-27 DIAGNOSIS — M722 Plantar fascial fibromatosis: Secondary | ICD-10-CM

## 2016-02-27 MED ORDER — TRIAMCINOLONE ACETONIDE 10 MG/ML IJ SUSP
10.0000 mg | Freq: Once | INTRAMUSCULAR | Status: AC
  Administered 2016-02-27: 10 mg

## 2016-02-27 MED ORDER — DICLOFENAC SODIUM 75 MG PO TBEC: 75.0000 mg | DELAYED_RELEASE_TABLET | Freq: Two times a day (BID) | ORAL | 2 refills | Status: DC

## 2016-02-27 NOTE — Patient Instructions (Signed)

## 2016-02-27 NOTE — Progress Notes (Signed)
   Subjective:    Patient ID: Nicole Oliver, female    DOB: 1965/07/16, 50 y.o.   MRN: [id number]  HPI Chief Complaint  Patient presents with  . Foot Pain    Right foot; heel; pt stated, "Has on and off pain; feels like walking on a rock"; x5 months      Review of Systems  All other systems reviewed and are negative.      Objective:   Physical Exam        Assessment & Plan:

## 2016-03-01 NOTE — Progress Notes (Signed)
Subjective:     Patient ID: Nicole Oliver, female   DOB: 1965-12-22, 50 y.o.   MRN: [id number]  HPI patient presents with a lot of discomfort plantar aspect right heel stating it's been present for around 4-6 months and gradually getting worse over that time   Review of Systems  All other systems reviewed and are negative.      Objective:   Physical Exam  Constitutional: She is oriented to person, place, and time.  Cardiovascular: Intact distal pulses.   Musculoskeletal: Normal range of motion.  Neurological: She is oriented to person, place, and time.  Skin: Skin is warm.  Nursing note and vitals reviewed.  neurovascular status intact muscle strength adequate range of motion was found to be within normal limits with mild equinus condition. Patient's noted to have intense discomfort medial fascial band right at the insertional point tendon into the calcaneus with fluid buildup noted and is also found to have good digital perfusion and is well oriented 3     Assessment:     Inflammatory fasciitis plantar aspect right heel at the insertional point of the tendon into the calcaneus    Plan:     H&P conditions reviewed and today I injected the fascia 3 mg Kenalog 5 mg Xylocaine and applied fascial brace and gave instructions on physical therapy. Patient will be seen back in the next several weeks and is to call with any questions  X-ray was taken indicated spur formation with no indications of stress fracture or arthritis

## 2016-03-17 ENCOUNTER — Telehealth: Payer: 59 | Admitting: Family

## 2016-03-17 DIAGNOSIS — B9689 Other specified bacterial agents as the cause of diseases classified elsewhere: Secondary | ICD-10-CM

## 2016-03-17 DIAGNOSIS — J019 Acute sinusitis, unspecified: Secondary | ICD-10-CM

## 2016-03-17 MED ORDER — AMOXICILLIN-POT CLAVULANATE 875-125 MG PO TABS: 1.0000 | ORAL_TABLET | Freq: Two times a day (BID) | ORAL | 0 refills | Status: DC

## 2016-03-17 MED FILL — AMOX-CLAV 875-125 MG TABLET: 875-125 | 10 days supply | Qty: 20 | Fill #0

## 2016-03-17 NOTE — Progress Notes (Signed)

## 2016-03-19 ENCOUNTER — Ambulatory Visit: Payer: 59 | Admitting: Podiatry

## 2016-04-07 ENCOUNTER — Other Ambulatory Visit: Payer: Self-pay | Admitting: Advanced Practice Midwife

## 2016-04-07 ENCOUNTER — Ambulatory Visit (HOSPITAL_COMMUNITY)
Admission: RE | Admit: 2016-04-07 | Discharge: 2016-04-07 | Disposition: A | Discharge status: Home / Self Care | Payer: 59 | Source: Ambulatory Visit | Attending: Family Medicine | Admitting: Family Medicine

## 2016-04-07 ENCOUNTER — Other Ambulatory Visit (HOSPITAL_COMMUNITY): Payer: Self-pay | Admitting: Physician Assistant

## 2016-04-07 DIAGNOSIS — M7989 Other specified soft tissue disorders: Principal | ICD-10-CM

## 2016-04-07 DIAGNOSIS — M79605 Pain in left leg: Secondary | ICD-10-CM

## 2016-04-07 DIAGNOSIS — M79669 Pain in unspecified lower leg: Secondary | ICD-10-CM | POA: Diagnosis not present

## 2016-04-07 DIAGNOSIS — M79662 Pain in left lower leg: Secondary | ICD-10-CM

## 2016-04-07 NOTE — Progress Notes (Signed)
VASCULAR LAB PRELIMINARY  PRELIMINARY  PRELIMINARY  PRELIMINARY  Left lower extremity venous duplex completed.    Preliminary report:  Left:  No evidence of DVT, superficial thrombosis, or Baker's cyst.  Hetal Proano, RVS 04/07/2016, 10:47 AM

## 2016-04-17 MED FILL — DICLOFENAC SOD 75 MG TAB EC: 75 | 25 days supply | Qty: 50 | Fill #0

## 2016-04-17 MED FILL — CYCLOBENZAPRINE 10 MG TAB: 10 | 10 days supply | Qty: 10 | Fill #0

## 2016-04-22 ENCOUNTER — Other Ambulatory Visit: Payer: Self-pay | Admitting: Gynecology

## 2016-04-22 MED FILL — FLUCONAZOLE 150 MG TABLET: 150 | 5 days supply | Qty: 5 | Fill #0

## 2016-04-22 MED FILL — ALPRAZolam 0.5 MG TABS: 0.5 | 30 days supply | Qty: 60 | Fill #0

## 2016-04-22 MED FILL — ESTRADIOL 0.075 MG PATCH: 0.075 | 84 days supply | Qty: 24 | Fill #3

## 2016-04-22 MED FILL — FENOFIBRATE 160 MG TABLET: 160 | 90 days supply | Qty: 90 | Fill #2

## 2016-05-07 ENCOUNTER — Encounter: Payer: 59 | Admitting: Gynecology

## 2016-05-21 DIAGNOSIS — F411 Generalized anxiety disorder: Secondary | ICD-10-CM | POA: Diagnosis not present

## 2016-05-21 DIAGNOSIS — R7301 Impaired fasting glucose: Secondary | ICD-10-CM | POA: Diagnosis not present

## 2016-05-21 DIAGNOSIS — R030 Elevated blood-pressure reading, without diagnosis of hypertension: Secondary | ICD-10-CM | POA: Diagnosis not present

## 2016-05-21 DIAGNOSIS — E782 Mixed hyperlipidemia: Secondary | ICD-10-CM | POA: Diagnosis not present

## 2016-05-21 DIAGNOSIS — Z0000 Encounter for general adult medical examination without abnormal findings: Secondary | ICD-10-CM | POA: Diagnosis not present

## 2016-05-22 ENCOUNTER — Other Ambulatory Visit: Payer: Self-pay | Admitting: Gynecology

## 2016-05-22 DIAGNOSIS — N6320 Unspecified lump in the left breast, unspecified quadrant: Secondary | ICD-10-CM

## 2016-05-27 ENCOUNTER — Ambulatory Visit
Admission: RE | Admit: 2016-05-27 | Discharge: 2016-05-27 | Disposition: A | Discharge status: Home / Self Care | Payer: 59 | Source: Ambulatory Visit | Attending: Gynecology | Admitting: Gynecology

## 2016-05-27 DIAGNOSIS — N6310 Unspecified lump in the right breast, unspecified quadrant: Secondary | ICD-10-CM | POA: Diagnosis not present

## 2016-05-27 DIAGNOSIS — N6320 Unspecified lump in the left breast, unspecified quadrant: Secondary | ICD-10-CM

## 2016-06-03 ENCOUNTER — Encounter: Payer: Self-pay | Admitting: Gynecology

## 2016-06-03 ENCOUNTER — Ambulatory Visit (INDEPENDENT_AMBULATORY_CARE_PROVIDER_SITE_OTHER): Payer: 59 | Admitting: Gynecology

## 2016-06-03 VITALS — BP 124/80 | Ht 65.0 in | Wt 182.0 lb

## 2016-06-03 DIAGNOSIS — R5383 Other fatigue: Secondary | ICD-10-CM | POA: Diagnosis not present

## 2016-06-03 DIAGNOSIS — E559 Vitamin D deficiency, unspecified: Secondary | ICD-10-CM

## 2016-06-03 DIAGNOSIS — Z01419 Encounter for gynecological examination (general) (routine) without abnormal findings: Secondary | ICD-10-CM | POA: Diagnosis not present

## 2016-06-03 DIAGNOSIS — Z79890 Hormone replacement therapy (postmenopausal): Secondary | ICD-10-CM

## 2016-06-03 DIAGNOSIS — F4323 Adjustment disorder with mixed anxiety and depressed mood: Secondary | ICD-10-CM | POA: Diagnosis not present

## 2016-06-03 LAB — CBC WITH DIFFERENTIAL/PLATELET
BASOS ABS: 0 {cells}/uL (ref 0–200)
Basophils Relative: 0 %
EOS ABS: 108 {cells}/uL (ref 15–500)
Eosinophils Relative: 2 %
HEMATOCRIT: 40.3 % (ref 35.0–45.0)
HEMOGLOBIN: 13.3 g/dL (ref 11.7–15.5)
LYMPHS ABS: 1944 {cells}/uL (ref 850–3900)
Lymphocytes Relative: 36 %
MCH: 31.8 pg (ref 27.0–33.0)
MCHC: 33 g/dL (ref 32.0–36.0)
MCV: 96.4 fL (ref 80.0–100.0)
MONO ABS: 378 {cells}/uL (ref 200–950)
MPV: 10.2 fL (ref 7.5–12.5)
Monocytes Relative: 7 %
NEUTROS PCT: 55 %
Neutro Abs: 2970 cells/uL (ref 1500–7800)
Platelets: 297 10*3/uL (ref 140–400)
RBC: 4.18 MIL/uL (ref 3.80–5.10)
RDW: 13.3 % (ref 11.0–15.0)
WBC: 5.4 10*3/uL (ref 3.8–10.8)

## 2016-06-03 LAB — COMPREHENSIVE METABOLIC PANEL
ALBUMIN: 4.6 g/dL (ref 3.6–5.1)
ALT: 14 U/L (ref 6–29)
AST: 18 U/L (ref 10–35)
Alkaline Phosphatase: 29 U/L — ABNORMAL LOW (ref 33–130)
BILIRUBIN TOTAL: 0.4 mg/dL (ref 0.2–1.2)
BUN: 14 mg/dL (ref 7–25)
CALCIUM: 9.8 mg/dL (ref 8.6–10.4)
CHLORIDE: 104 mmol/L (ref 98–110)
CO2: 27 mmol/L (ref 20–31)
Creat: 0.66 mg/dL (ref 0.50–1.05)
Glucose, Bld: 106 mg/dL — ABNORMAL HIGH (ref 65–99)
Potassium: 4.4 mmol/L (ref 3.5–5.3)
Sodium: 140 mmol/L (ref 135–146)
Total Protein: 7 g/dL (ref 6.1–8.1)

## 2016-06-03 LAB — URINALYSIS W MICROSCOPIC + REFLEX CULTURE
Bacteria, UA: NONE SEEN [HPF]
Bilirubin Urine: NEGATIVE
CASTS: NONE SEEN [LPF]
Crystals: NONE SEEN [HPF]
Glucose, UA: NEGATIVE
Hgb urine dipstick: NEGATIVE
KETONES UR: NEGATIVE
Leukocytes, UA: NEGATIVE
NITRITE: NEGATIVE
PH: 7 (ref 5.0–8.0)
Protein, ur: NEGATIVE
SPECIFIC GRAVITY, URINE: 1.019 (ref 1.001–1.035)
Yeast: NONE SEEN [HPF]

## 2016-06-03 LAB — TSH: TSH: 2.12 m[IU]/L

## 2016-06-03 MED ORDER — FLUOXETINE HCL 20 MG PO CAPS: 20.0000 mg | ORAL_CAPSULE | Freq: Every day | ORAL | 12 refills | Status: DC

## 2016-06-03 MED ORDER — ESTRADIOL 0.075 MG/24HR TD PTTW: 1.0000 | MEDICATED_PATCH | TRANSDERMAL | 12 refills | Status: DC

## 2016-06-03 MED FILL — FLUoxetine HCL 20 MG CAPS: 20 | 30 days supply | Qty: 30 | Fill #0

## 2016-06-03 NOTE — Progress Notes (Addendum)
CASI ALIMI 1965/08/01 [id number]        50 y.o.  G0P0 for annual exam.  Also is having issues with fatigue and anxiety/depression. Patient notes just not feeling well over all and feeling depressed. Particularly during the holiday seasons. Has lost most of her family members with cancer and this is really weighing her down. Uses a low dose of Xanax by her primary physician but still has these feelings. Is still battling with the decision about talking to a genetic counselor and being tested in reference to her trunk family history of breast cancer. No significant weight gain or weight loss. Some sleep disturbance. No feelings to harm herself or others.  Past medical history,surgical history, problem list, medications, allergies, family history and social history were all reviewed and documented as reviewed in the EPIC chart.  ROS:  Performed with pertinent positives and negatives included in the history, assessment and plan.   Additional significant findings :  None   Exam: [name] assistant Vitals:   06/03/16 0759  BP: 124/80  Weight: 182 lb (82.6 kg)  Height: 5\' 5"  (1.651 m)   Body mass index is 30.29 kg/m.  General appearance:  Normal affect, orientation and appearance. Skin: Grossly normal HEENT: Without gross lesions.  No cervical or supraclavicular adenopathy. Thyroid normal.  Lungs:  Clear without wheezing, rales or rhonchi Cardiac: RR, without RMG Abdominal:  Soft, nontender, without masses, guarding, rebound, organomegaly or hernia Breasts:  Examined lying and sitting without masses, retractions, discharge or axillary adenopathy. Pelvic:  Ext, BUS, Vagina normal  Adnexa without masses or tenderness    Anus and perineum normal   Rectovaginal normal sphincter tone without palpated masses or tenderness.    Assessment/Plan:  51 y.o. G0P0 female for annual exam.  1. Anxiety/depression. Has been dealing with this for over a year. Seems be getting a little worse.  Feels related to loss of family members and particularly bad around the holidays. Options for management reviewed and ultimately we have decided to initiate a low-dose fluoxetine at 20 mg daily. Side effect profile and risks to include worsening depression and suicidal ideation discussed. Will call if she does not feel she is getting an adequate response for dose adjustment. Will consider referral for counseling if does not get an adequate response. I think her fatigue is related to this but will check baseline TSH CBC and comp has a metabolic panel. 2. Postmenopausal/HRT. Status post TVH/BSO 2005 for leiomyoma and endometriosis. On Vivelle 0.075 patches doing well. Has missed the patch and had quick onset of hot flushes and sweats. I reviewed the most current 2017 NAMS guidelines on HRT. Benefits of symptom relief and possible cardiovascular/bone health and started early versus risks of stroke heart attack DVT and breast cancer reviewed. Patient wants to continue and I refilled her 1 year. 3. Family history of breast cancer. Patient's mother, maternal aunt, sister and sister's daughter all had breast cancer in her 45s. Have counseled her multiple times per genetic counseling which I thought she was going to follow up on last year but she failed to do so. I again reviewed my strong recommendation she pursue this. At this point she is not interested but will call if she changes her mind. She does clearly understand her significant risk of breast cancer genetically positive. Still at increased risk based on her family history if genetically negative. She is doing annual mammography and annual MRIs. Mammogram 05/2016. MRI 09/2015. Will follow up when due for  MRI. 4. Pap smear 2015. No Pap smear done today. No history of significant abnormal Pap smears. Per current screening guidelines options to stop screening based on hysterectomy history reviewed. Will readdress on annual basis. 5. Colonoscopy 2010 with reported  repeat interval 10 years. 6. DEXA 2011 normal. Will plan repeat further in the menopause. Was significantly low on vitamin D last year at 23. Check vitamin D level today. 7. Health maintenance. Baseline CBC, CMP, TSH and urinalysis done. Lipid profile last year was excellent and not repeated this year. Follow up if desires to pursue genetic counseling. Follow up if issues once starting fluoxetine. Follow up in one year for annual exam  Additional time in excess of her routine gynecologic exam was spent in direct face to face counseling and coordination of care in regards to her anxiety/depression and fatigue.    [name] MD, 8:26 AM 06/03/2016

## 2016-06-03 NOTE — Patient Instructions (Signed)
Start on the fluoxetine medication daily. Let me know if you start feeling more depressed or if you do not feel any better so that we can make a dose adjustment.  Let me know if you want to pursue genetic counseling in reference to the family history of breast cancer

## 2016-06-04 LAB — VITAMIN D 25 HYDROXY (VIT D DEFICIENCY, FRACTURES): Vit D, 25-Hydroxy: 37 ng/mL (ref 30–100)

## 2016-06-04 LAB — URINE CULTURE

## 2016-07-30 MED FILL — ALPRAZolam 0.5 MG TABS: 0.5 | 30 days supply | Qty: 60 | Fill #0

## 2016-09-02 MED FILL — ESTRADIOL 0.075 MG PATCH: 0.075 | 28 days supply | Qty: 8 | Fill #0

## 2016-10-12 MED FILL — ESTRADIOL 0.075 MG PATCH: 0.075 | 28 days supply | Qty: 8 | Fill #1

## 2016-10-12 MED FILL — ALPRAZolam 0.5 MG TABS: 0.5 | 30 days supply | Qty: 60 | Fill #0

## 2016-10-15 ENCOUNTER — Ambulatory Visit (INDEPENDENT_AMBULATORY_CARE_PROVIDER_SITE_OTHER): Payer: 59

## 2016-10-15 ENCOUNTER — Encounter: Payer: Self-pay | Admitting: Podiatry

## 2016-10-15 ENCOUNTER — Ambulatory Visit (INDEPENDENT_AMBULATORY_CARE_PROVIDER_SITE_OTHER): Payer: 59 | Admitting: Podiatry

## 2016-10-15 DIAGNOSIS — M722 Plantar fascial fibromatosis: Secondary | ICD-10-CM

## 2016-10-15 DIAGNOSIS — T148XXA Other injury of unspecified body region, initial encounter: Secondary | ICD-10-CM

## 2016-10-17 NOTE — Progress Notes (Signed)
Subjective:    Patient ID: Nicole Oliver, female   DOB: 51 y.o.   MRN: [id number]   HPI patient states she's developed a lot of pain in the bottom of the right heel and states she felt a pop several weeks ago and it's been really bothering her since and it felt like a rubber band snapped    ROS      Objective:  Physical Exam Neurovascular status intact with quite a bit of discomfort in the plantar heel right and proximal with no Achilles tendon involvement and what appears to be moderate tearing of the plantar fascia    Assessment:   Appears to be plantar injury with probable tear of the strands of the plantar fascia secondary to acute pressure      Plan:    H&P condition reviewed and air fracture walker administered with instructions on usage. Patient will be seen back again in the next several weeks and is instructed to call if any problems should occur   X-ray indicates that there is no signs stress fracture advanced arthritis or traumatic fracture

## 2016-11-05 ENCOUNTER — Encounter: Payer: Self-pay | Admitting: Podiatry

## 2016-11-05 ENCOUNTER — Ambulatory Visit (INDEPENDENT_AMBULATORY_CARE_PROVIDER_SITE_OTHER): Payer: 59 | Admitting: Podiatry

## 2016-11-05 DIAGNOSIS — T148XXA Other injury of unspecified body region, initial encounter: Secondary | ICD-10-CM

## 2016-11-05 DIAGNOSIS — M722 Plantar fascial fibromatosis: Secondary | ICD-10-CM | POA: Diagnosis not present

## 2016-11-05 MED ORDER — TRIAMCINOLONE ACETONIDE 10 MG/ML IJ SUSP
10.0000 mg | Freq: Once | INTRAMUSCULAR | Status: AC
Start: 2016-11-05 — End: 2016-11-05
  Administered 2016-11-05: 10 mg

## 2016-11-09 NOTE — Progress Notes (Signed)
Subjective:    Patient ID: Nicole Oliver, female   DOB: 51 y.o.   MRN: [id number]   HPI patient states it seems to be feeling better and it seems that the boot is helping me but I'm still getting some pain    ROS      Objective:  Physical Exam Neurovascular status intact with mild diminishment of pain right secondary to immobilization with inflammation fluid still noted    Assessment:    Plantar fasciitis right improving but painful     Plan:    H&P condition reviewed and today I went ahead and I did careful injection right 3 mg Kenalog 5 mg Xylocaine and I went ahead and discuss gradual reduction of boot over the next 4 weeks

## 2016-11-24 MED FILL — ESTRADIOL 0.075 MG PATCH: 0.075 | 28 days supply | Qty: 8 | Fill #2

## 2016-12-02 ENCOUNTER — Ambulatory Visit (INDEPENDENT_AMBULATORY_CARE_PROVIDER_SITE_OTHER): Payer: 59 | Admitting: Sports Medicine

## 2016-12-02 ENCOUNTER — Encounter: Payer: Self-pay | Admitting: Sports Medicine

## 2016-12-02 DIAGNOSIS — M79671 Pain in right foot: Secondary | ICD-10-CM | POA: Diagnosis not present

## 2016-12-02 MED ORDER — GABAPENTIN 300 MG PO CAPS: 300.0000 mg | ORAL_CAPSULE | Freq: Every day | ORAL | 1 refills | Status: DC

## 2016-12-02 MED FILL — GABAPENTIN 300 MG CAPSULE: 300 | 60 days supply | Qty: 60 | Fill #0

## 2016-12-02 NOTE — Progress Notes (Signed)
   Subjective:    Patient ID: Nicole Oliver, female    DOB: Mar 14, 1966, 51 y.o.   MRN: [id number]  HPI Ms. Gunkel is a 51 yo female who presents with right heel pain for almost two years. She reports that she had a left ACL tear in 2016, and she first noticed the right heel pain during her rehab. She describes the pain as a nail going into her heel or walking on a rock. She also has a burning sensation just at her heel. She first came to Sports Medicine clinic over a year ago and was told she may have plantar fasciitis and to do ice and stretching exercises. Due to continued symptoms, she went to the foot specialists and two steroid injections done which did not result in significant improvement of her symptoms. About two months ago, she was leaning over her bed and felt a pop "like a rubber band popping" on her heel. Her symptoms continue to be stable and are not improving. She has been wearing a boot on the right for the past 6 weeks or so and in the last week she started to wear running shoes with insoles.    Review of Systems As noted above     Objective:   Physical Exam Gen: NAD Ankle/Feet: No visible erythema or swelling. Cavus foot, high arch, and flattening of transverse arch bilaterally.  Range of motion is full in all directions. Strength is 5/5 in all directions. No tenderness to palpation of the right ankle and foot including the medial calcaneus   Limited Ultrasound of the right foot:  Plantar fascia thickness 0.4cm (upper limits of normal), slight amount of hypoechoic areas within the fascia but not consistent with chronic plantar fascitis. No heel spur noted     Assessment & Plan:  Right Heel Pain:  Not consistent with plantar fasciitis. More consistent with nerve irritation with symptoms of numbness at the heel. Not the classic presentation for tarsal tunnel syndrome.  - green insoles with scaphoid pads  - trial of Gabapentin 300mg  qhs  - follow up in 3-4 weeks for  re-evaluation  Smiley Houseman, MD PGY 2 Family Medicine    Patient seen and evaluated with the resident. I agree with the above plan of care. Patient has very little tenderness to palpation at the origin of the plantar fascia. Her ultrasound also is not consistent with chronic plantar fasciitis. I think she is getting some irritation of the tibial nerve, specifically the medial calcaneal branch which supplies sensation to the heel. I'm going to try some green sports insoles and scaphoid pads as well as a trial of gabapentin 300 mg daily at bedtime. She'll follow-up with me in 3-4 weeks for reevaluation. If symptoms persist at follow-up, consider further diagnostic imaging. Call with questions or concerns in the interim.

## 2016-12-07 MED FILL — GABAPENTIN 100 MG CAPSULE: 100 | 30 days supply | Qty: 30 | Fill #0

## 2016-12-24 ENCOUNTER — Other Ambulatory Visit: Payer: Self-pay | Admitting: Gynecology

## 2016-12-24 MED FILL — ALPRAZolam 0.5 MG TABS: 0.5 | 30 days supply | Qty: 60 | Fill #0

## 2016-12-24 MED FILL — ESTRADIOL 0.075 MG PATCH: 0.075 | 28 days supply | Qty: 8 | Fill #3

## 2016-12-24 MED FILL — FLUCONAZOLE 150 MG TABLET: 150 | 5 days supply | Qty: 5 | Fill #0

## 2016-12-28 ENCOUNTER — Ambulatory Visit (INDEPENDENT_AMBULATORY_CARE_PROVIDER_SITE_OTHER): Payer: 59 | Admitting: Sports Medicine

## 2016-12-28 ENCOUNTER — Encounter: Payer: Self-pay | Admitting: Sports Medicine

## 2016-12-28 VITALS — BP 130/82 | Ht 65.0 in | Wt 168.0 lb

## 2016-12-28 DIAGNOSIS — M25571 Pain in right ankle and joints of right foot: Secondary | ICD-10-CM | POA: Diagnosis not present

## 2016-12-28 NOTE — Patient Instructions (Signed)
[facility] [address] [phone]

## 2016-12-28 NOTE — Progress Notes (Signed)
   Subjective:    Patient ID: Nicole Oliver, female    DOB: [birth year], 51 y.o.   MRN: [id number]  HPI   Patient comes in today for follow-up on right foot pain. She had to remove the scaphoid pad from her green sports insole. She also had to decrease her dose of gabapentin from 300 mg daily at bedtime to 100 mg daily at bedtime. She does think that the gabapentin has been helpful but she does have a headache upon awakening the following day. The burning pain in her foot has improved. But she is still getting a discomfort in her heel which she describes as "feeling like a stone bruise". She is also getting a "rubber band type feeling" throughout her midfoot and into her first toe. Previous x-rays have been fairly unremarkable.    Review of Systems    as above Objective:   Physical Exam  Well-developed, well-nourished. No acute distress  Examination of her foot shows slight tenderness to palpation in the middle of the calcaneus but minimal tenderness at the calcaneal origin of the plantar fascia. Equivocal Tinel's at the tarsal tunnel. Good strength. Cavus foot. No swelling. Neurovascularly intact distally.      Assessment & Plan:   Chronic right foot pain-rule out tarsal tunnel syndrome  Patient has had chronic foot pain which has been unresponsive to treatment including multiple cortisone injections for plantar fasciitis. Her physical exam, as well as her ultrasound findings in June, do not seem to correlate with plantar fasciitis. I think a lot of her pain is neuropathic. She has noticed some improvement with gabapentin. We need to work this up further with EMG/nerve conduction studies and an MRI of her right ankle. Phone follow-up after those studies at which point we will delineate a more definitive treatment plan. In the meantime, continue with her green sports insoles and 100 mg of gabapentin at night.

## 2016-12-30 ENCOUNTER — Telehealth: Payer: 59 | Admitting: Nurse Practitioner

## 2016-12-30 DIAGNOSIS — L255 Unspecified contact dermatitis due to plants, except food: Secondary | ICD-10-CM

## 2016-12-30 MED ORDER — PREDNISONE 10 MG (21) PO TBPK: ORAL_TABLET | ORAL | 0 refills | Status: DC

## 2016-12-30 MED FILL — predniSONE 10 MG TABS: 10 | 6 days supply | Qty: 21 | Fill #0

## 2016-12-30 NOTE — Progress Notes (Signed)

## 2017-01-07 ENCOUNTER — Ambulatory Visit
Admission: RE | Admit: 2017-01-07 | Discharge: 2017-01-07 | Disposition: A | Discharge status: Home / Self Care | Payer: 59 | Source: Ambulatory Visit | Attending: Sports Medicine | Admitting: Sports Medicine

## 2017-01-07 DIAGNOSIS — M25571 Pain in right ankle and joints of right foot: Secondary | ICD-10-CM | POA: Diagnosis not present

## 2017-01-14 ENCOUNTER — Telehealth: Payer: Self-pay | Admitting: Sports Medicine

## 2017-01-14 NOTE — Telephone Encounter (Signed)
I spoke with the patient yesterday on the phone regarding MRI findings of her right ankle. No evidence of plantar fasciitis. 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 She is feeling much better after taking a 6 day Sterapred Dosepak for some poison oak. In fact, she has canceled her EMG/nerve conduction study. I recommended that she follow-up with me again in 2 weeks. Although her MRI shows findings of tenosynovitis I still question whether or not there is an element of tarsal tunnel syndrome present. We may need to reconsider merits of EMG/nerve conduction study if her symptoms return. We'll discuss this further at follow-up in 2 weeks.

## 2017-01-28 ENCOUNTER — Ambulatory Visit (INDEPENDENT_AMBULATORY_CARE_PROVIDER_SITE_OTHER): Payer: 59 | Admitting: Sports Medicine

## 2017-01-28 VITALS — BP 138/84 | Ht 65.0 in | Wt 168.0 lb

## 2017-01-28 DIAGNOSIS — M25571 Pain in right ankle and joints of right foot: Secondary | ICD-10-CM | POA: Diagnosis not present

## 2017-01-28 NOTE — Patient Instructions (Signed)
  You have tendinitis in one of the tendons in your ankle (flexor hallucis longus)  Try either the arch strap or the body helix ankle sleeve  We could consider custom orthotics if you would like  I could also send you to Dr. Doran Durand to discuss possible surgery  I will not make a follow-up appointment. I will just wait to hear back from you if you need me for anything.

## 2017-01-29 NOTE — Progress Notes (Signed)
   Subjective:    Patient ID: Nicole Oliver, female    DOB: 1966/06/05, 51 y.o.   MRN: [id number]  HPI   Patient comes in today to discuss MRI findings of her right ankle. She has findings consistent with tendinitis in the flexor hallucis longus and flexor digitorum tendons. No evidence of plantar fasciitis. Her symptoms had nearly resolved on a Sterapred Dosepak but they have started to return. Symptoms are most noticeable with activity. She has found that if she wears an arch strap and ankle support that her symptoms are less.    Review of Systems As above    Objective:   Physical Exam  Well-developed, well-nourished. No acute distress.  Right ankle: Full range of motion. No effusion. No soft tissue swelling. She does have reproducible pain with resisted flexion of the great toe. No real pain with resisted flexion of the lesser toes. Positive Tinel's at the tarsal tunnel. Good pulses. Walking without a limp.      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 I discussed treatment options with her including continuing with arch support in the way of an arch strap or with taping. I also discussed custom orthotics which may be helpful for her cavus foot. We did try a temporary insert with a scaphoid pad but she did not feel like she had enough support with this. I also discussed the possibility of surgical referral given the chronicity of her problem. I think some of her symptoms are from tibial nerve irritation and the question would be whether or not she may benefit from a decompression of her tarsal tunnel. At this point, she has decided to stick with taping for arch support and she will think about her other options. I will see her as needed.

## 2017-02-08 DIAGNOSIS — L728 Other follicular cysts of the skin and subcutaneous tissue: Secondary | ICD-10-CM | POA: Diagnosis not present

## 2017-02-08 DIAGNOSIS — L821 Other seborrheic keratosis: Secondary | ICD-10-CM | POA: Diagnosis not present

## 2017-03-01 MED FILL — FENOFIBRATE 160 MG TABLET: 160 | 90 days supply | Qty: 90 | Fill #0

## 2017-03-01 MED FILL — ESTRADIOL 0.075 MG PATCH: 0.075 | 28 days supply | Qty: 8 | Fill #4

## 2017-03-01 MED FILL — ALPRAZolam 0.5 MG TABS: 0.5 | 30 days supply | Qty: 60 | Fill #0

## 2017-03-07 IMAGING — MR MR KNEE*L* W/O CM
4 of 6 series · 19 of 40 positions shown · non-contrast
Comparison: Radiographs 09/16/2014.  MRI 06/03/2007.

CLINICAL DATA: Left knee pain and weakness with limited
weight-bearing following injury 4 days ago. No previous relevant
surgery. Initial encounter.

EXAM:
MRI OF THE LEFT KNEE WITHOUT CONTRAST
TECHNIQUE: Multiplanar, multisequence MR imaging of the knee was performed. No
intravenous contrast was administered.

[Series 3: PD fat-sat · axial · 4.0mm · 0.29mm/px · z∈[-58,+52]mm · 7 of 23 slices shown (1 of 4)]
[im 1/23]
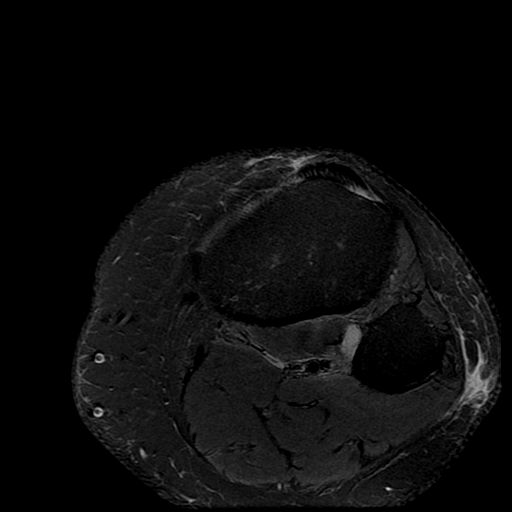
[im 4/23]
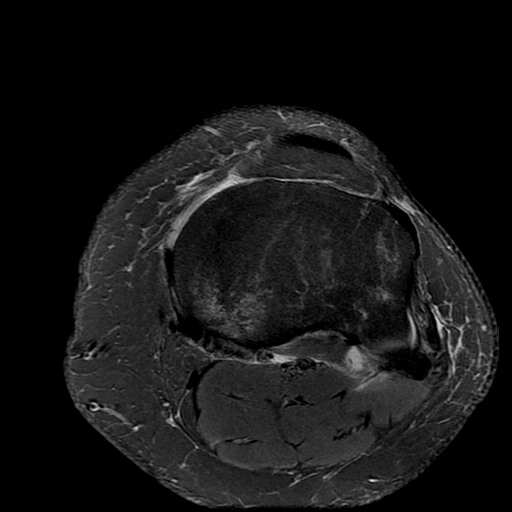
[im 8/23]
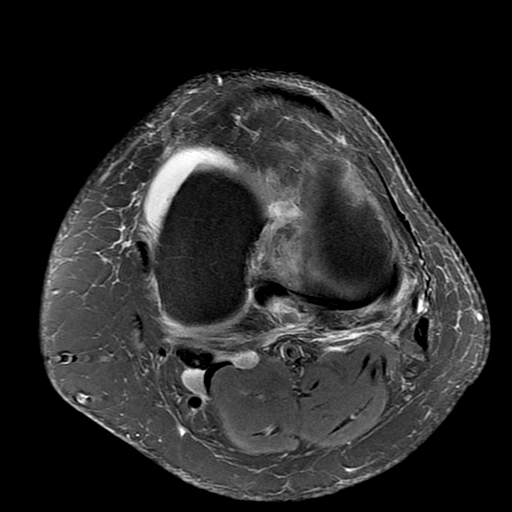
[im 12/23]
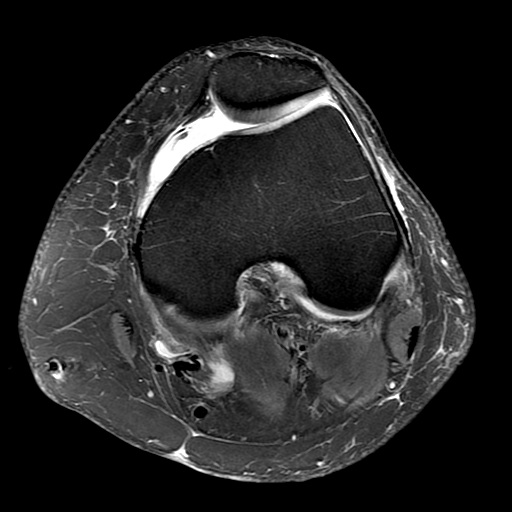
[im 15/23]
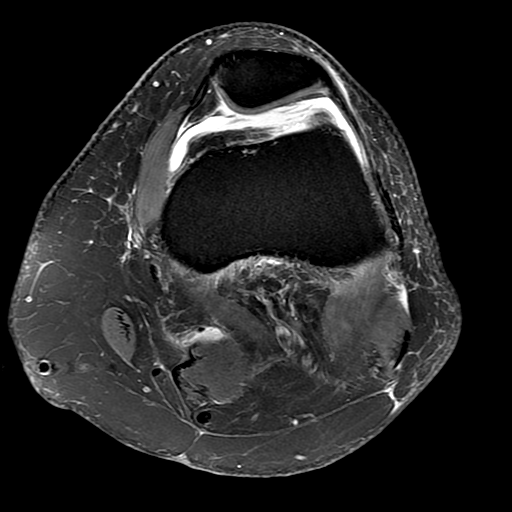
[im 19/23]
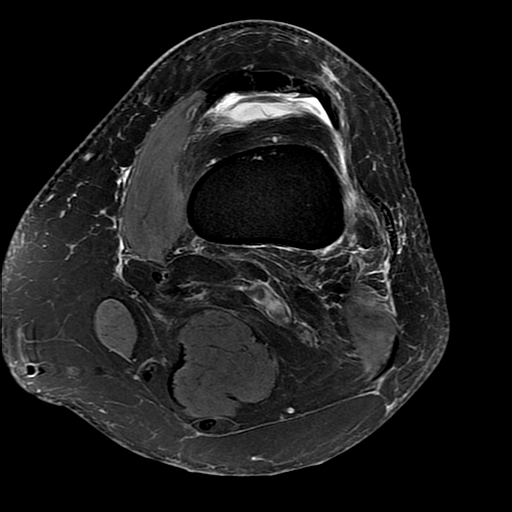
[im 23/23]
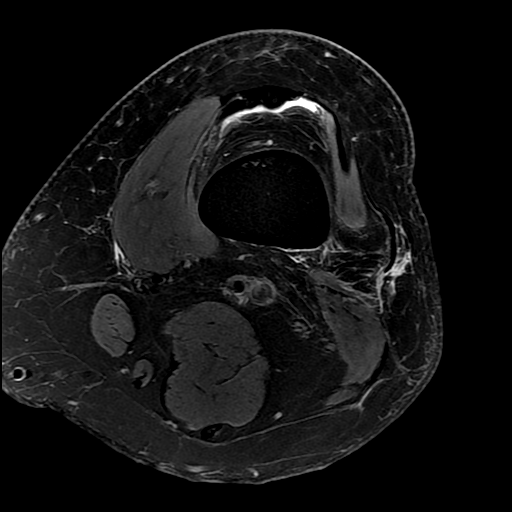

[Series 4: PD fat-sat · sagittal · 3.0mm · 0.29mm/px · 6 of 27 slices shown (2 of 4)]
[im 1/27]
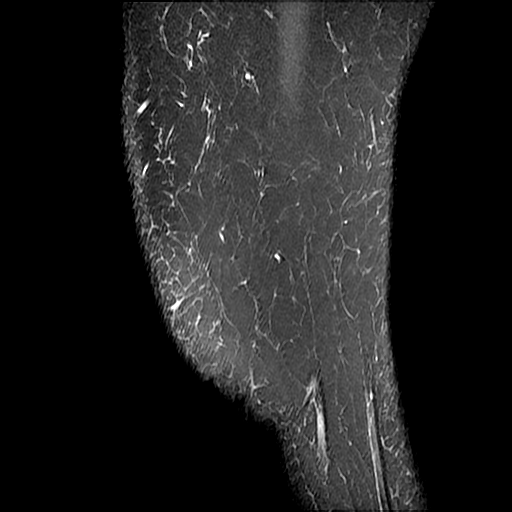
[im 4/27]
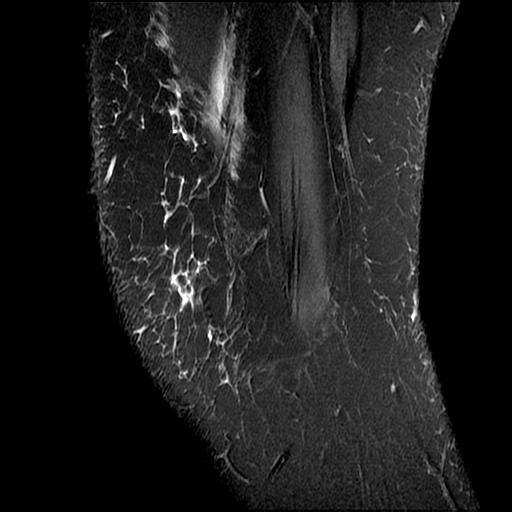
[im 8/27]
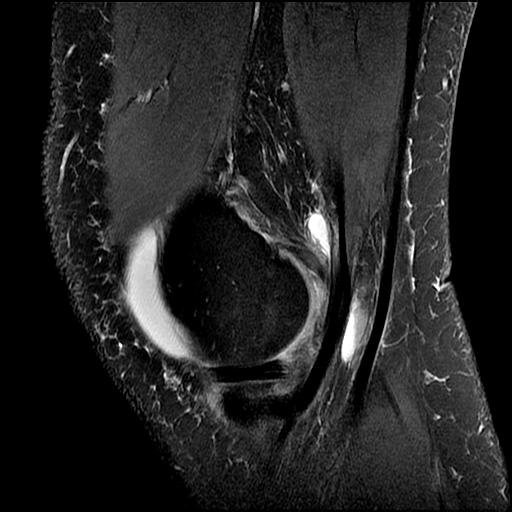
[im 12/27]
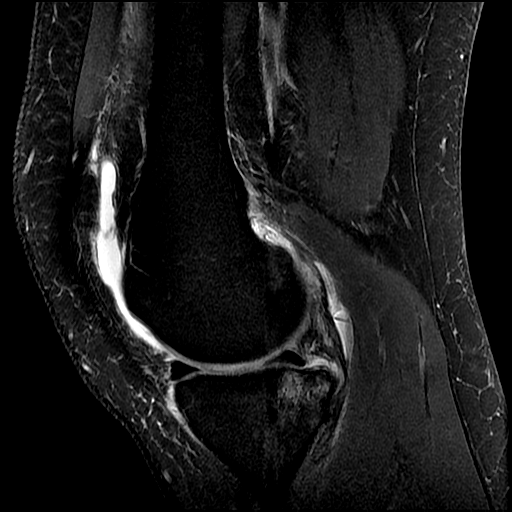
[im 15/27]
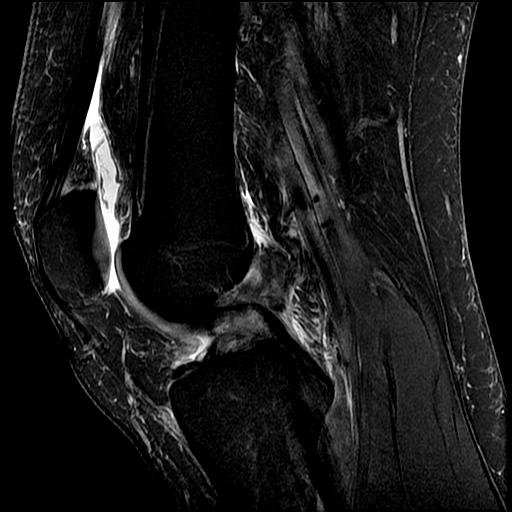
[im 23/27]
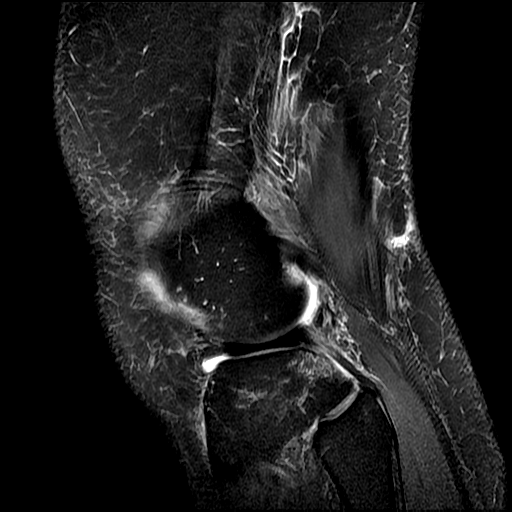

[Series 7: PD fat-sat · coronal · 4.0mm · 0.31mm/px · 3 of 24 slices shown (3 of 4)]
[im 4/24]
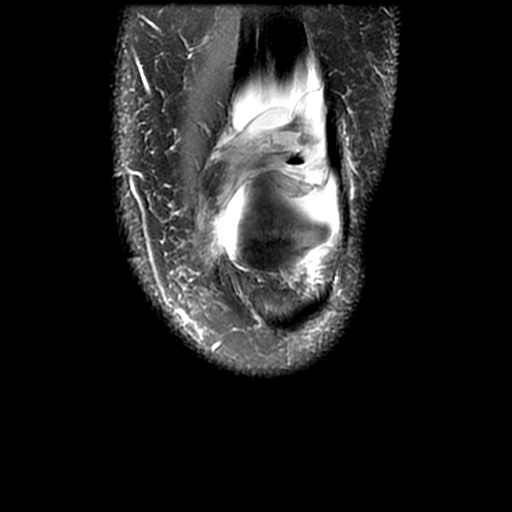
[im 12/24]
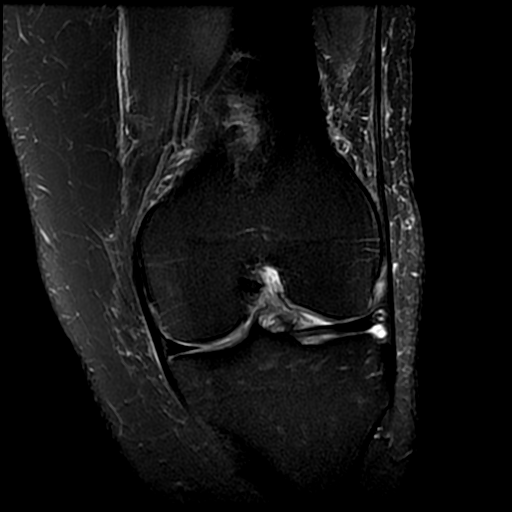
[im 20/24]
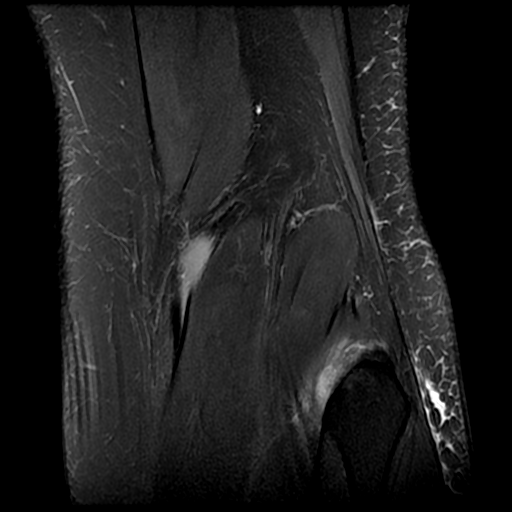

[Series 8: PD fat-sat · oblique · 2.0mm · 0.31mm/px · 3 of 13 slices shown (4 of 4)]
[im 1/13]
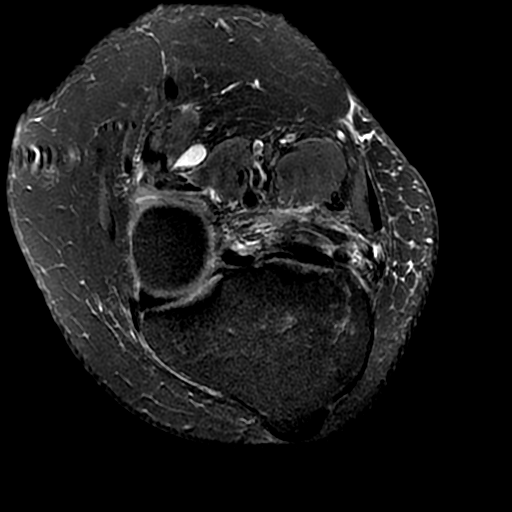
[im 9/13]
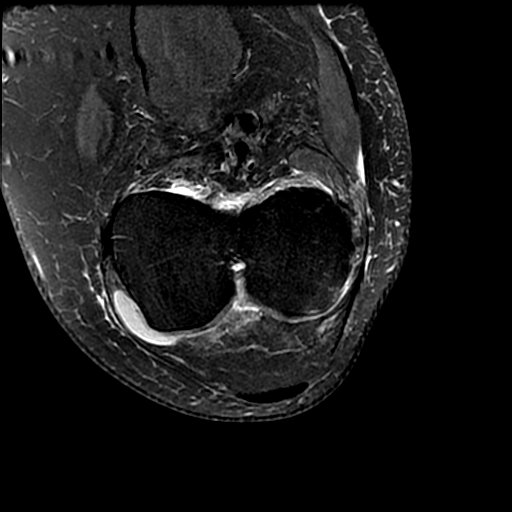
[im 13/13]
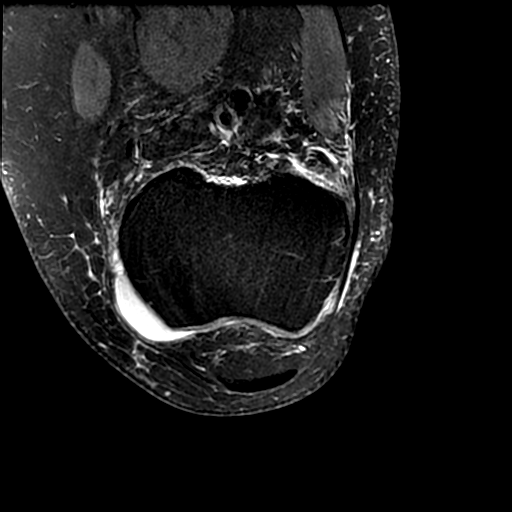

[19 of 40 positions shown; findings below may reference images not displayed]

FINDINGS: MENISCI

Medial meniscus:  Intact with normal morphology.

Lateral meniscus:  Intact with normal morphology.

LIGAMENTS

Cruciates: There is an acute complete tear of the anterior cruciate
ligament with surrounding hemorrhage. The PCL is intact.

Collaterals:  Intact.

CARTILAGE

Patellofemoral:  Preserved.

Medial:  Preserved.

Lateral:  Preserved.

Joint: Moderate mildly complex joint effusion, likely hemarthrosis.
There is a probable small air bubble in the joint, attributed to
recent joint aspiration.

Popliteal Fossa: Small Baker's cyst containing a fluid-fluid level,
likely hematocrit effect from hemarthrosis. There is fluid within
the popliteus hiatus with posterolateral corner soft tissue edema.

Extensor Mechanism:  Intact.

Bones: There are bone contusions of both tibial plateaus
posteriorly. No femoral osseous injury identified.
IMPRESSION: 1. Acute complete tear of the anterior cruciate ligament.
2. Associated bone contusions of both tibial plateaus posteriorly.
3. Moderate hemarthrosis.
4. Posterolateral soft tissue edema without disruption of the
lateral collateral ligament complex.
5. Intact menisci and collateral ligaments.

## 2017-04-01 MED FILL — ESTRADIOL 0.075 MG PATCH: 0.075 | 28 days supply | Qty: 8 | Fill #5

## 2017-04-15 MED FILL — FAMCICLOVIR 500 MG TABLET: 500 | 10 days supply | Qty: 30 | Fill #0

## 2017-05-03 ENCOUNTER — Other Ambulatory Visit: Payer: Self-pay | Admitting: Gynecology

## 2017-05-03 MED FILL — ALPRAZolam 0.5 MG TABS: 0.5 | 30 days supply | Qty: 60 | Fill #0

## 2017-05-03 MED FILL — FLUCONAZOLE 150 MG TABLET: 150 | 5 days supply | Qty: 5 | Fill #0

## 2017-05-03 MED FILL — ESTRADIOL 0.075 MG PATCH: 0.075 | 28 days supply | Qty: 8 | Fill #6

## 2017-05-24 DIAGNOSIS — M545 Low back pain: Secondary | ICD-10-CM | POA: Diagnosis not present

## 2017-05-24 DIAGNOSIS — M9903 Segmental and somatic dysfunction of lumbar region: Secondary | ICD-10-CM | POA: Diagnosis not present

## 2017-05-24 DIAGNOSIS — M5136 Other intervertebral disc degeneration, lumbar region: Secondary | ICD-10-CM | POA: Diagnosis not present

## 2017-05-24 DIAGNOSIS — M5416 Radiculopathy, lumbar region: Secondary | ICD-10-CM | POA: Diagnosis not present

## 2017-05-26 DIAGNOSIS — M5136 Other intervertebral disc degeneration, lumbar region: Secondary | ICD-10-CM | POA: Diagnosis not present

## 2017-05-26 DIAGNOSIS — M9903 Segmental and somatic dysfunction of lumbar region: Secondary | ICD-10-CM | POA: Diagnosis not present

## 2017-05-26 DIAGNOSIS — M545 Low back pain: Secondary | ICD-10-CM | POA: Diagnosis not present

## 2017-05-26 DIAGNOSIS — M5416 Radiculopathy, lumbar region: Secondary | ICD-10-CM | POA: Diagnosis not present

## 2017-05-27 DIAGNOSIS — M5416 Radiculopathy, lumbar region: Secondary | ICD-10-CM | POA: Diagnosis not present

## 2017-05-27 DIAGNOSIS — M545 Low back pain: Secondary | ICD-10-CM | POA: Diagnosis not present

## 2017-05-27 DIAGNOSIS — M9903 Segmental and somatic dysfunction of lumbar region: Secondary | ICD-10-CM | POA: Diagnosis not present

## 2017-05-27 DIAGNOSIS — M5136 Other intervertebral disc degeneration, lumbar region: Secondary | ICD-10-CM | POA: Diagnosis not present

## 2017-05-27 MED FILL — ESTRADIOL 0.075 MG PATCH: 0.075 | 28 days supply | Qty: 8 | Fill #7

## 2017-06-03 ENCOUNTER — Other Ambulatory Visit: Payer: Self-pay | Admitting: Gynecology

## 2017-06-03 DIAGNOSIS — Z1231 Encounter for screening mammogram for malignant neoplasm of breast: Secondary | ICD-10-CM

## 2017-06-07 ENCOUNTER — Encounter: Payer: Self-pay | Admitting: Gynecology

## 2017-06-07 ENCOUNTER — Ambulatory Visit (INDEPENDENT_AMBULATORY_CARE_PROVIDER_SITE_OTHER): Payer: 59 | Admitting: Gynecology

## 2017-06-07 VITALS — BP 124/80 | Ht 64.0 in | Wt 184.0 lb

## 2017-06-07 DIAGNOSIS — Z13220 Encounter for screening for lipoid disorders: Secondary | ICD-10-CM | POA: Diagnosis not present

## 2017-06-07 DIAGNOSIS — Z1329 Encounter for screening for other suspected endocrine disorder: Secondary | ICD-10-CM | POA: Diagnosis not present

## 2017-06-07 DIAGNOSIS — Z01419 Encounter for gynecological examination (general) (routine) without abnormal findings: Secondary | ICD-10-CM

## 2017-06-07 DIAGNOSIS — Z79890 Hormone replacement therapy (postmenopausal): Secondary | ICD-10-CM

## 2017-06-07 DIAGNOSIS — R300 Dysuria: Secondary | ICD-10-CM | POA: Diagnosis not present

## 2017-06-07 DIAGNOSIS — N898 Other specified noninflammatory disorders of vagina: Secondary | ICD-10-CM

## 2017-06-07 DIAGNOSIS — E559 Vitamin D deficiency, unspecified: Secondary | ICD-10-CM | POA: Diagnosis not present

## 2017-06-07 LAB — WET PREP FOR TRICH, YEAST, CLUE

## 2017-06-07 MED ORDER — FLUCONAZOLE 150 MG PO TABS: 150.0000 mg | ORAL_TABLET | Freq: Once | ORAL | 0 refills | Status: DC

## 2017-06-07 MED ORDER — ESTRADIOL 0.075 MG/24HR TD PTTW: 1.0000 | MEDICATED_PATCH | TRANSDERMAL | 4 refills | Status: DC

## 2017-06-07 MED FILL — FLUCONAZOLE 150 MG TABLET: 150 | 1 days supply | Qty: 1 | Fill #0

## 2017-06-07 NOTE — Addendum Note (Signed)
Addended by: Nelva Nay on: 06/07/2017 09:30 AM   Modules accepted: Orders

## 2017-06-07 NOTE — Progress Notes (Signed)
Nicole Oliver 1966-03-13 [id number]        51 y.o.  G0P0 for annual gynecologic exam.  Also complaining of several days of vaginal itching.  No discharge or odor.  Does note some dysuria with voiding.  No urgency frequency low back pain fever or chills.  No nausea vomiting diarrhea constipation.  Past medical history,surgical history, problem list, medications, allergies, family history and social history were all reviewed and documented as reviewed in the EPIC chart.  ROS:  Performed with pertinent positives and negatives included in the history, assessment and plan.   Additional significant findings : None   Exam: [name] assistant Vitals:   06/07/17 0820  BP: 124/80  Weight: 184 lb (83.5 kg)  Height: 5\' 4"  (1.626 m)   Body mass index is 31.58 kg/m.  General appearance:  Normal affect, orientation and appearance. Skin: Grossly normal HEENT: Without gross lesions.  No cervical or supraclavicular adenopathy. Thyroid normal.  Lungs:  Clear without wheezing, rales or rhonchi Cardiac: RR, without RMG Abdominal:  Soft, nontender, without masses, guarding, rebound, organomegaly or hernia Breasts:  Examined lying and sitting without masses, retractions, discharge or axillary adenopathy. Pelvic:  Ext, BUS, Vagina: Normal.  Scant discharge noted.  Wet prep done.  Pap smear done  Adnexa: Without masses or tenderness    Anus and perineum: Normal   Rectovaginal: Normal sphincter tone without palpated masses or tenderness.    Assessment/Plan:  51 y.o. G0P0 female for annual gynecologic exam.  Status post TVH/BSO 2005 for leiomyoma and endometriosis.  1. Vaginal itching/dysuria.  Wet prep and urine analysis are negative.  Given the history I am going to treat her as a low-grade yeast with Diflucan 150 mg x1 dose.  Patient will call if her symptoms persist. 2. Postmenopausal/HRT.  Continues on Vivelle 0.075 mg patches doing well.  I again reviewed the whole issue of HRT, risks  versus benefits.  Stroke heart attack DVT and breast cancer issues reviewed.  Benefits to include cardiovascular and bone health as well as symptom relief also discussed.  At this point the patient wants to continue.  Refill times 1 year provided. 3. Pap smear 2015.  Pap smear done today.  No history of abnormal Pap smears.  Options to stop screening per current screening guidelines based on hysterectomy history reviewed.  Will readdress on annual basis. 4. DEXA 2011 normal.  Will plan repeat further into the menopause.  History of vitamin D deficiency.  On vitamin D supplement now.  Check vitamin D level. 5. Mammography due now and patient has scheduled in January.  Breast exam normal today.  SBE monthly reviewed.  Family history of breast cancer in her mother, maternal aunt, sister and sister's daughter.  We have talked about genetic screening multiple times and the patient declines.  She again declines today.  Increased screening protocols to include annual MRIs with her mammography also have been reviewed and she did one 09/2015.  Will plan mammogram this January and MRI this coming year.  She will call me if she changes her mind about genetic screening. 6. Colonoscopy 2010.  Repeat at their recommended interval. 7. Anxiety.  Is on Xanax per her primary physician.  Had transiently tried fluoxetine last year but did not like the way she felt.  Will continue to follow-up with him in reference to this. 8. Health maintenance.  Baseline CBC, CMP, lipid profile, vitamin D, TSH, urine analysis done.  Follow-up in 1 year.  Sooner if any  issues.  Additional time in excess of her routine gynecologic exam was spent in direct face to face counseling and coordination of care in regards to her itching and dysuria.    [name] MD, 8:45 AM 06/07/2017

## 2017-06-07 NOTE — Patient Instructions (Signed)
Take the one Diflucan pill for the vaginal itching.  Call if your symptoms persist.  Follow-up in 1 year for annual exam

## 2017-06-08 LAB — URINALYSIS W MICROSCOPIC + REFLEX CULTURE
BILIRUBIN URINE: NEGATIVE
Bacteria, UA: NONE SEEN /HPF
GLUCOSE, UA: NEGATIVE
HGB URINE DIPSTICK: NEGATIVE
HYALINE CAST: NONE SEEN /LPF
KETONES UR: NEGATIVE
LEUKOCYTE ESTERASE: NEGATIVE
NITRITES URINE, INITIAL: NEGATIVE
PH: 5.5 (ref 5.0–8.0)
Protein, ur: NEGATIVE
RBC / HPF: NONE SEEN /HPF (ref 0–2)
SPECIFIC GRAVITY, URINE: 1.02 (ref 1.001–1.03)
WBC UA: NONE SEEN /HPF (ref 0–5)

## 2017-06-08 LAB — LIPID PANEL
Cholesterol: 185 mg/dL (ref <200)
HDL: 51 mg/dL (ref >50)
LDL CHOLESTEROL (CALC): 109 mg/dL — AB
Non-HDL Cholesterol (Calc): 134 mg/dL (calc) — ABNORMAL HIGH (ref <130)
Total CHOL/HDL Ratio: 3.6 (calc) (ref <5.0)
Triglycerides: 131 mg/dL (ref <150)

## 2017-06-08 LAB — COMPREHENSIVE METABOLIC PANEL
AG RATIO: 2.3 (calc) (ref 1.0–2.5)
ALBUMIN MSPROF: 4.5 g/dL (ref 3.6–5.1)
ALKALINE PHOSPHATASE (APISO): 49 U/L (ref 33–130)
ALT: 12 U/L (ref 6–29)
AST: 14 U/L (ref 10–35)
BILIRUBIN TOTAL: 0.7 mg/dL (ref 0.2–1.2)
BUN/Creatinine Ratio: 18 (calc) (ref 6–22)
BUN: 9 mg/dL (ref 7–25)
CALCIUM: 9.5 mg/dL (ref 8.6–10.4)
CO2: 26 mmol/L (ref 20–32)
Chloride: 106 mmol/L (ref 98–110)
Creat: 0.49 mg/dL — ABNORMAL LOW (ref 0.50–1.05)
Globulin: 2 g/dL (calc) (ref 1.9–3.7)
Glucose, Bld: 105 mg/dL — ABNORMAL HIGH (ref 65–99)
POTASSIUM: 4.6 mmol/L (ref 3.5–5.3)
Sodium: 140 mmol/L (ref 135–146)
Total Protein: 6.5 g/dL (ref 6.1–8.1)

## 2017-06-08 LAB — CBC WITH DIFFERENTIAL/PLATELET
BASOS ABS: 30 {cells}/uL (ref 0–200)
Basophils Relative: 0.6 %
Eosinophils Absolute: 120 cells/uL (ref 15–500)
Eosinophils Relative: 2.4 %
HEMATOCRIT: 38.3 % (ref 35.0–45.0)
Hemoglobin: 13.4 g/dL (ref 11.7–15.5)
LYMPHS ABS: 2090 {cells}/uL (ref 850–3900)
MCH: 31.9 pg (ref 27.0–33.0)
MCHC: 35 g/dL (ref 32.0–36.0)
MCV: 91.2 fL (ref 80.0–100.0)
MPV: 10.5 fL (ref 7.5–12.5)
Monocytes Relative: 7.5 %
NEUTROS PCT: 47.7 %
Neutro Abs: 2385 cells/uL (ref 1500–7800)
Platelets: 239 10*3/uL (ref 140–400)
RBC: 4.2 10*6/uL (ref 3.80–5.10)
RDW: 12.1 % (ref 11.0–15.0)
Total Lymphocyte: 41.8 %
WBC: 5 10*3/uL (ref 3.8–10.8)
WBCMIX: 375 {cells}/uL (ref 200–950)

## 2017-06-08 LAB — NO CULTURE INDICATED

## 2017-06-08 LAB — VITAMIN D 25 HYDROXY (VIT D DEFICIENCY, FRACTURES): Vit D, 25-Hydroxy: 32 ng/mL (ref 30–100)

## 2017-06-08 LAB — TSH: TSH: 2.15 mIU/L

## 2017-06-09 LAB — PAP IG W/ RFLX HPV ASCU

## 2017-06-18 MED FILL — ESTRADIOL 0.075 MG PATCH: 0.075 | 84 days supply | Qty: 24 | Fill #0

## 2017-06-23 DIAGNOSIS — M79671 Pain in right foot: Secondary | ICD-10-CM | POA: Diagnosis not present

## 2017-06-23 MED FILL — IBUPROFEN 800 MG TABS: 800 | 30 days supply | Qty: 90 | Fill #0

## 2017-06-29 DIAGNOSIS — M5416 Radiculopathy, lumbar region: Secondary | ICD-10-CM | POA: Diagnosis not present

## 2017-06-29 DIAGNOSIS — M5136 Other intervertebral disc degeneration, lumbar region: Secondary | ICD-10-CM | POA: Diagnosis not present

## 2017-06-29 DIAGNOSIS — M979XXS Periprosthetic fracture around unspecified internal prosthetic joint, sequela: Secondary | ICD-10-CM | POA: Diagnosis not present

## 2017-06-29 DIAGNOSIS — M545 Low back pain: Secondary | ICD-10-CM | POA: Diagnosis not present

## 2017-07-02 ENCOUNTER — Telehealth: Payer: 59 | Admitting: Family

## 2017-07-02 DIAGNOSIS — B9689 Other specified bacterial agents as the cause of diseases classified elsewhere: Secondary | ICD-10-CM | POA: Diagnosis not present

## 2017-07-02 DIAGNOSIS — J028 Acute pharyngitis due to other specified organisms: Secondary | ICD-10-CM

## 2017-07-02 MED ORDER — AZITHROMYCIN 250 MG PO TABS: ORAL_TABLET | ORAL | 0 refills | Status: DC

## 2017-07-02 MED ORDER — BENZONATATE 100 MG PO CAPS: 100.0000 mg | ORAL_CAPSULE | Freq: Three times a day (TID) | ORAL | 0 refills | Status: DC | PRN

## 2017-07-02 MED FILL — ALPRAZolam 0.5 MG TABS: 0.5 | 30 days supply | Qty: 60 | Fill #0

## 2017-07-02 MED FILL — BENZONATATE 100 MG CAPS: 100 | 5 days supply | Qty: 30 | Fill #0

## 2017-07-02 MED FILL — AZITHROMYCIN 250 MG TABLET: 250 | 5 days supply | Qty: 6 | Fill #0

## 2017-07-02 NOTE — Progress Notes (Signed)

## 2017-07-05 ENCOUNTER — Ambulatory Visit: Payer: Self-pay

## 2017-07-07 MED FILL — FENOFIBRATE 160 MG TABLET: 160 | 90 days supply | Qty: 90 | Fill #0

## 2017-07-22 ENCOUNTER — Ambulatory Visit
Admission: RE | Admit: 2017-07-22 | Discharge: 2017-07-22 | Disposition: A | Discharge status: Home / Self Care | Payer: 59 | Source: Ambulatory Visit | Attending: Gynecology | Admitting: Gynecology

## 2017-07-22 DIAGNOSIS — Z1231 Encounter for screening mammogram for malignant neoplasm of breast: Secondary | ICD-10-CM

## 2017-08-09 MED FILL — IBUPROFEN 800 MG TABS: 800 | 30 days supply | Qty: 90 | Fill #1

## 2017-08-18 DIAGNOSIS — E782 Mixed hyperlipidemia: Secondary | ICD-10-CM | POA: Diagnosis not present

## 2017-08-18 DIAGNOSIS — Z0000 Encounter for general adult medical examination without abnormal findings: Secondary | ICD-10-CM | POA: Diagnosis not present

## 2017-08-18 DIAGNOSIS — R7301 Impaired fasting glucose: Secondary | ICD-10-CM | POA: Diagnosis not present

## 2017-08-18 DIAGNOSIS — R030 Elevated blood-pressure reading, without diagnosis of hypertension: Secondary | ICD-10-CM | POA: Diagnosis not present

## 2017-08-18 DIAGNOSIS — L299 Pruritus, unspecified: Secondary | ICD-10-CM | POA: Diagnosis not present

## 2017-08-18 DIAGNOSIS — F411 Generalized anxiety disorder: Secondary | ICD-10-CM | POA: Diagnosis not present

## 2017-08-19 MED FILL — TRIAMCINOLONE 0.1% CREAM: 0.1 | 7 days supply | Qty: 15 | Fill #0

## 2017-09-08 DIAGNOSIS — R030 Elevated blood-pressure reading, without diagnosis of hypertension: Secondary | ICD-10-CM | POA: Diagnosis not present

## 2017-09-13 MED FILL — ALPRAZolam 0.5 MG TABS: 0.5 | 30 days supply | Qty: 60 | Fill #0

## 2017-11-12 MED FILL — ALPRAZolam 0.5 MG TABS: 0.5 | 30 days supply | Qty: 60 | Fill #0

## 2018-01-03 MED FILL — FENOFIBRATE 160 MG TABLET: 160 | 90 days supply | Qty: 90 | Fill #0

## 2018-01-17 MED FILL — ESTRADIOL 0.075 MG PATCH: 0.075 | 84 days supply | Qty: 24 | Fill #1

## 2018-01-31 ENCOUNTER — Other Ambulatory Visit: Payer: Self-pay | Admitting: Gynecology

## 2018-02-02 ENCOUNTER — Other Ambulatory Visit: Payer: Self-pay | Admitting: Gynecology

## 2018-02-02 MED FILL — FLUCONAZOLE 150 MG TABS: 150 | 1 days supply | Qty: 1 | Fill #0

## 2018-02-07 ENCOUNTER — Other Ambulatory Visit (HOSPITAL_COMMUNITY): Payer: Self-pay | Admitting: Family Medicine

## 2018-02-07 ENCOUNTER — Ambulatory Visit (HOSPITAL_COMMUNITY)
Admission: RE | Admit: 2018-02-07 | Discharge: 2018-02-07 | Disposition: A | Discharge status: Home / Self Care | Payer: 59 | Source: Ambulatory Visit | Attending: Surgery | Admitting: Surgery

## 2018-02-07 DIAGNOSIS — M7989 Other specified soft tissue disorders: Secondary | ICD-10-CM

## 2018-02-07 DIAGNOSIS — M79662 Pain in left lower leg: Secondary | ICD-10-CM | POA: Insufficient documentation

## 2018-02-15 MED FILL — ALPRAZolam 0.5 MG TABS: 0.5 | 30 days supply | Qty: 60 | Fill #0

## 2018-03-21 MED FILL — FENOFIBRATE 160 MG TABLET: 160 | 90 days supply | Qty: 90 | Fill #1

## 2018-03-25 MED FILL — ESTRADIOL 0.075 MG PATCH: 0.075 | 84 days supply | Qty: 24 | Fill #2

## 2018-05-17 MED FILL — ALPRAZolam 0.5 MG TABS: 0.5 | 30 days supply | Qty: 60 | Fill #0

## 2018-05-21 ENCOUNTER — Telehealth: Payer: 59 | Admitting: Family

## 2018-05-21 DIAGNOSIS — H938X3 Other specified disorders of ear, bilateral: Secondary | ICD-10-CM

## 2018-05-21 NOTE — Progress Notes (Signed)
Based on what you shared with me it looks like you have a condition that should be evaluated in a face to face office visit.  NOTE: If you entered your credit card information for this eVisit, you will not be charged. You may see a "hold" on your card for the $30 but that hold will drop off and you will not have a charge processed.  If you are having a true medical emergency please call 911.  If you need an urgent face to face visit, [facility] has four urgent care centers for your convenience.  If you need care fast and have a high deductible or no insurance consider:   [email or web address] to reserve your spot online an avoid wait times  [facility] [address], [address] [place], [place] [address] 8 am to 8 pm Monday-Friday 10 am to 4 pm Saturday-Sunday *Across the street from [employer]  [facility]  [address] [place], [address] 8 am to 5 pm Monday-Friday * In the [facility] the [facility]   The following sites will take your  insurance:  . [facility]  [phone] Get Driving Directions Find a Provider at this Location  [address] [place], [place] [address] . 10 am to 8 pm Monday-Friday . 12 pm to 8 pm Saturday-Sunday   . [facility]  [phone] Get Driving Directions Find a Provider at this Location  [address], [address] [place], [place] [address] . 8 am to 8 pm Monday-Friday . 9 am to 6 pm Saturday . 11 am to 6 pm Sunday   . [facility]  [phone] Get Driving Directions  [address] [place], [place] [address] . 8 am to 8 pm Monday-Friday . 8 am to 4 pm Saturday-Sunday   Your e-visit answers were reviewed by a board certified advanced clinical practitioner to complete your personal care plan.  Thank you for using e-Visits. 

## 2018-06-08 ENCOUNTER — Encounter: Payer: 59 | Admitting: Gynecology

## 2018-06-20 ENCOUNTER — Encounter: Payer: Self-pay | Admitting: Gynecology

## 2018-06-20 ENCOUNTER — Ambulatory Visit (INDEPENDENT_AMBULATORY_CARE_PROVIDER_SITE_OTHER): Payer: 59 | Admitting: Gynecology

## 2018-06-20 ENCOUNTER — Other Ambulatory Visit: Payer: Self-pay | Admitting: Gynecology

## 2018-06-20 VITALS — BP 122/78 | Ht 64.0 in | Wt 187.0 lb

## 2018-06-20 DIAGNOSIS — Z1321 Encounter for screening for nutritional disorder: Secondary | ICD-10-CM | POA: Diagnosis not present

## 2018-06-20 DIAGNOSIS — Z1329 Encounter for screening for other suspected endocrine disorder: Secondary | ICD-10-CM

## 2018-06-20 DIAGNOSIS — Z01419 Encounter for gynecological examination (general) (routine) without abnormal findings: Secondary | ICD-10-CM

## 2018-06-20 DIAGNOSIS — N760 Acute vaginitis: Secondary | ICD-10-CM | POA: Diagnosis not present

## 2018-06-20 LAB — CBC WITH DIFFERENTIAL/PLATELET
Absolute Monocytes: 457 cells/uL (ref 200–950)
Basophils Absolute: 50 cells/uL (ref 0–200)
Basophils Relative: 0.9 %
Eosinophils Absolute: 110 cells/uL (ref 15–500)
Eosinophils Relative: 2 %
HCT: 38.2 % (ref 35.0–45.0)
Hemoglobin: 13.1 g/dL (ref 11.7–15.5)
Lymphs Abs: 1826 cells/uL (ref 850–3900)
MCH: 31.8 pg (ref 27.0–33.0)
MCHC: 34.3 g/dL (ref 32.0–36.0)
MCV: 92.7 fL (ref 80.0–100.0)
MPV: 10.6 fL (ref 7.5–12.5)
Monocytes Relative: 8.3 %
Neutro Abs: 3058 cells/uL (ref 1500–7800)
Neutrophils Relative %: 55.6 %
Platelets: 304 10*3/uL (ref 140–400)
RBC: 4.12 10*6/uL (ref 3.80–5.10)
RDW: 12.1 % (ref 11.0–15.0)
Total Lymphocyte: 33.2 %
WBC: 5.5 10*3/uL (ref 3.8–10.8)

## 2018-06-20 MED ORDER — FLUCONAZOLE 150 MG PO TABS: ORAL_TABLET | ORAL | 4 refills | Status: DC

## 2018-06-20 MED ORDER — ESTRADIOL 0.5 MG PO TABS: 0.5000 mg | ORAL_TABLET | Freq: Every day | ORAL | 4 refills | Status: DC

## 2018-06-20 MED FILL — FLUCONAZOLE 150 MG TABS: 150 | 84 days supply | Qty: 6 | Fill #0

## 2018-06-20 MED FILL — ESTRADIOL 0.5 MG TABLET: 0.5 | 90 days supply | Qty: 90 | Fill #0

## 2018-06-20 MED FILL — FENOFIBRATE 160 MG TABLET: 160 | 90 days supply | Qty: 90 | Fill #2

## 2018-06-20 NOTE — Progress Notes (Signed)
    Nicole Oliver 27-Aug-1965 [id number]        52 y.o.  G0P0 for annual gynecologic exam.  Overall doing well.  She does note that she is having difficulty with her estradiol patches sticking to her skin.  She also notes recurrent vaginitis that she uses OTC antifungal 3 times monthly.  She has had a history of recurrent yeast infections in the past.  She was on a chronic suppressive Diflucan regimen by Dr. Cherylann Banas.  Currently not having issues.  Past medical history,surgical history, problem list, medications, allergies, family history and social history were all reviewed and documented as reviewed in the EPIC chart.  ROS:  Performed with pertinent positives and negatives included in the history, assessment and plan.   Additional significant findings : None   Exam: [name] assistant Vitals:   06/20/18 0934  BP: 122/78  Weight: 187 lb (84.8 kg)  Height: 5\' 4"  (1.626 m)   Body mass index is 32.1 kg/m.  General appearance:  Normal affect, orientation and appearance. Skin: Grossly normal HEENT: Without gross lesions.  No cervical or supraclavicular adenopathy. Thyroid normal.  Lungs:  Clear without wheezing, rales or rhonchi Cardiac: RR, without RMG Abdominal:  Soft, nontender, without masses, guarding, rebound, organomegaly or hernia Breasts:  Examined lying and sitting without masses, retractions, discharge or axillary adenopathy. Pelvic:  Ext, BUS, Vagina: Normal with mild atrophic changes.  No significant discharge.  Adnexa: Without masses or tenderness    Anus and perineum: Normal   Rectovaginal: Normal sphincter tone without palpated masses or tenderness.    Assessment/Plan:  52 y.o. G0P0 female for annual gynecologic exam.   1. Postmenopausal/HRT.  Status post TVH/BSO 2005 for leiomyoma and endometriosis.  Using estradiol 0.075 mg patches but having issues with them sticking.  We discussed the whole issue of HRT, risks versus benefits.  Increased risk of thrombosis  to include stroke heart attack DVT in the breast cancer issue versus benefits as far as symptom relief and possible cardiovascular and bone health when started early.  Benefits of transdermal as far as decreased thrombosis risk also discussed.  After discussion the patient is comfortable with trying oral estradiol 0.5 mg daily.  1 year supply provided.  We will follow-up if any issues in the transition. 2. DEXA 2011 normal.  History of marginal vitamin D.  Check vitamin D level today.  Plan follow-up bone density at age 42.  As she remains on estrogen. 3. Colonoscopy 2010.  Repeat at their recommended interval. 4. Pap smear 2018.  No Pap smear done today.  No history of significant abnormal Pap smears.  Options to stop screening per current screening guidelines based on hysterectomy history reviewed.  Will readdress on an annual basis. 5. Mammography coming due in January and she is in the process of arranging.  Breast exam normal today. 6. Health maintenance.  Baseline DBC comprehensive metabolic panel, hemoglobin A1c, vitamin D and TSH ordered.  Has other lab work done elsewhere she is being followed for her cholesterol.  Follow-up in 1 year, sooner as needed.   [name] MD, 9:56 AM 06/20/2018

## 2018-06-20 NOTE — Patient Instructions (Signed)
Take the Diflucan pill every other week over the next 6 months.  We will see if this does not suppress recurrent yeast infections.  Start on the estrogen pills instead of the patches.  Let me know if you have any issues with this.  Follow-up for annual exam in 1 year.

## 2018-06-21 ENCOUNTER — Other Ambulatory Visit: Payer: Self-pay | Admitting: Gynecology

## 2018-06-21 DIAGNOSIS — Z1231 Encounter for screening mammogram for malignant neoplasm of breast: Secondary | ICD-10-CM

## 2018-06-21 LAB — HEMOGLOBIN A1C
EAG (MMOL/L): 6.2 (calc)
Hgb A1c MFr Bld: 5.5 % of total Hgb (ref <5.7)
Mean Plasma Glucose: 111 (calc)

## 2018-06-21 LAB — COMPREHENSIVE METABOLIC PANEL
AG RATIO: 2.1 (calc) (ref 1.0–2.5)
ALKALINE PHOSPHATASE (APISO): 31 U/L — AB (ref 33–130)
ALT: 17 U/L (ref 6–29)
AST: 18 U/L (ref 10–35)
Albumin: 4.7 g/dL (ref 3.6–5.1)
BILIRUBIN TOTAL: 0.4 mg/dL (ref 0.2–1.2)
BUN: 13 mg/dL (ref 7–25)
CHLORIDE: 105 mmol/L (ref 98–110)
CO2: 24 mmol/L (ref 20–32)
Calcium: 9.9 mg/dL (ref 8.6–10.4)
Creat: 0.65 mg/dL (ref 0.50–1.05)
GLOBULIN: 2.2 g/dL (ref 1.9–3.7)
Glucose, Bld: 97 mg/dL (ref 65–99)
POTASSIUM: 4.8 mmol/L (ref 3.5–5.3)
SODIUM: 139 mmol/L (ref 135–146)
TOTAL PROTEIN: 6.9 g/dL (ref 6.1–8.1)

## 2018-06-21 LAB — VITAMIN D 25 HYDROXY (VIT D DEFICIENCY, FRACTURES): Vit D, 25-Hydroxy: 39 ng/mL (ref 30–100)

## 2018-06-21 LAB — TSH: TSH: 2.57 mIU/L

## 2018-07-22 ENCOUNTER — Telehealth: Payer: Self-pay | Admitting: *Deleted

## 2018-07-22 MED ORDER — ESTRADIOL 1 MG PO TABS: 1.0000 mg | ORAL_TABLET | Freq: Every day | ORAL | 3 refills | Status: DC

## 2018-07-22 MED FILL — TRIAMCINOLONE 0.1% CREAM: 0.1 | 7 days supply | Qty: 15 | Fill #1

## 2018-07-22 MED FILL — ALPRAZolam 0.5 MG TABS: 0.5 | 30 days supply | Qty: 60 | Fill #0

## 2018-07-22 NOTE — Telephone Encounter (Signed)
Patient was prescribed estradiol 0.5 mg tablet 1 po daily, reports you told her to could take 2 if needed due to symptoms. Patient did increase to 2 tablets and doing well. She would like a new Rx for estradiol 1 mg 1 po daily. Okay to send?

## 2018-07-22 NOTE — Telephone Encounter (Signed)
Okay to replace until next annual exam

## 2018-07-22 NOTE — Telephone Encounter (Signed)
Rx sent 

## 2018-07-25 ENCOUNTER — Ambulatory Visit
Admission: RE | Admit: 2018-07-25 | Discharge: 2018-07-25 | Disposition: A | Discharge status: Home / Self Care | Payer: 59 | Source: Ambulatory Visit | Attending: Gynecology | Admitting: Gynecology

## 2018-07-25 DIAGNOSIS — Z1231 Encounter for screening mammogram for malignant neoplasm of breast: Secondary | ICD-10-CM

## 2018-08-02 MED FILL — ESTRADIOL 1 MG TABLET: 1 | 90 days supply | Qty: 90 | Fill #0 | Status: TO

## 2018-09-17 MED FILL — FLUCONAZOLE 150 MG TABS: 150 | 84 days supply | Qty: 6 | Fill #0

## 2018-09-19 MED FILL — FENOFIBRATE 160 MG TABLET: 160 | 90 days supply | Qty: 90 | Fill #0

## 2018-09-19 MED FILL — ALPRAZolam 0.5 MG TABS: 0.5 | 30 days supply | Qty: 60 | Fill #0

## 2018-09-21 ENCOUNTER — Telehealth: Payer: 59 | Admitting: Nurse Practitioner

## 2018-09-21 DIAGNOSIS — J0100 Acute maxillary sinusitis, unspecified: Secondary | ICD-10-CM | POA: Diagnosis not present

## 2018-09-21 MED ORDER — AMOXICILLIN-POT CLAVULANATE 875-125 MG PO TABS: 1.0000 | ORAL_TABLET | Freq: Two times a day (BID) | ORAL | 0 refills | Status: DC

## 2018-09-21 MED FILL — AMOX-CLAV 875-125 MG TABLET: 875-125 | 7 days supply | Qty: 14 | Fill #0

## 2018-09-21 NOTE — Progress Notes (Signed)
We are sorry that you are not feeling well.  Here is how we plan to help!  Based on what you have shared with me it looks like you have sinusitis.  Sinusitis is inflammation and infection in the sinus cavities of the head.  Based on your presentation I believe you most likely have Acute Bacterial Sinusitis.  This is an infection caused by bacteria and is treated with antibiotics. I have prescribed Augmentin 875mg/125mg one tablet twice daily with food, for 7 days. You may use an oral decongestant such as Mucinex D or if you have glaucoma or high blood pressure use plain Mucinex. Saline nasal spray help and can safely be used as often as needed for congestion.  If you develop worsening sinus pain, fever or notice severe headache and vision changes, or if symptoms are not better after completion of antibiotic, please schedule an appointment with a health care provider.    Sinus infections are not as easily transmitted as other respiratory infection, however we still recommend that you avoid close contact with loved ones, especially the very young and elderly.  Remember to wash your hands thoroughly throughout the day as this is the number one way to prevent the spread of infection!  Home Care:  Only take medications as instructed by your medical team.  Complete the entire course of an antibiotic.  Do not take these medications with alcohol.  A steam or ultrasonic humidifier can help congestion.  You can place a towel over your head and breathe in the steam from hot water coming from a faucet.  Avoid close contacts especially the very young and the elderly.  Cover your mouth when you cough or sneeze.  Always remember to wash your hands.  Get Help Right Away If:  You develop worsening fever or sinus pain.  You develop a severe head ache or visual changes.  Your symptoms persist after you have completed your treatment plan.  Make sure you  Understand these instructions.  Will watch your  condition.  Will get help right away if you are not doing well or get worse.  Your e-visit answers were reviewed by a board certified advanced clinical practitioner to complete your personal care plan.  Depending on the condition, your plan could have included both over the counter or prescription medications.  If there is a problem please reply  once you have received a response from your provider.  Your safety is important to us.  If you have drug allergies check your prescription carefully.    You can use MyChart to ask questions about today's visit, request a non-urgent call back, or ask for a work or school excuse for 24 hours related to this e-Visit. If it has been greater than 24 hours you will need to follow up with your provider, or enter a new e-Visit to address those concerns.  You will get an e-mail in the next two days asking about your experience.  I hope that your e-visit has been valuable and will speed your recovery. Thank you for using e-visits.   5 minutes spent reviewing and documenting in chart.  

## 2018-10-01 MED FILL — ESTRADIOL 1 MG TABLET: 1 | 90 days supply | Qty: 90 | Fill #0

## 2018-12-05 MED FILL — FLUCONAZOLE 150 MG TABS: 150 | 84 days supply | Qty: 6 | Fill #1

## 2018-12-12 MED FILL — FENOFIBRATE 160 MG TABLET: 160 | 90 days supply | Qty: 90 | Fill #1

## 2018-12-22 MED FILL — ALPRAZolam 0.5 MG TABS: 0.5 | 30 days supply | Qty: 60 | Fill #0

## 2018-12-26 MED FILL — ESTRADIOL 1 MG TABLET: 1 | 90 days supply | Qty: 90 | Fill #1

## 2019-03-13 MED FILL — FENOFIBRATE 160 MG TABLET: 160 | 90 days supply | Qty: 90 | Fill #0

## 2019-03-13 MED FILL — FLUCONAZOLE 150 MG TABS: 150 | 84 days supply | Qty: 6 | Fill #2

## 2019-03-15 ENCOUNTER — Encounter: Payer: Self-pay | Admitting: Gynecology

## 2019-03-15 MED FILL — ALPRAZolam 0.5 MG TABS: 0.5 | 30 days supply | Qty: 60 | Fill #0

## 2019-03-24 MED FILL — ESTRADIOL 1 MG TABLET: 1 | 90 days supply | Qty: 90 | Fill #2

## 2019-05-08 MED FILL — ALPRAZolam 0.5 MG TABS: 0.5 | 30 days supply | Qty: 60 | Fill #0

## 2019-05-08 MED FILL — TRIAMCINOLONE 0.1% CREAM: 0.1 | 10 days supply | Qty: 15 | Fill #0

## 2019-05-15 ENCOUNTER — Telehealth: Payer: 59 | Admitting: Physician Assistant

## 2019-05-15 DIAGNOSIS — N3000 Acute cystitis without hematuria: Secondary | ICD-10-CM | POA: Diagnosis not present

## 2019-05-15 MED ORDER — NITROFURANTOIN MONOHYD MACRO 100 MG PO CAPS: 100.0000 mg | ORAL_CAPSULE | Freq: Two times a day (BID) | ORAL | 0 refills | Status: AC

## 2019-05-15 MED FILL — NITROFURANTOIN MONO-MCR 100: 100 | 5 days supply | Qty: 10 | Fill #0

## 2019-05-15 NOTE — Progress Notes (Signed)
We are sorry that you are not feeling well.  Here is how we plan to help!  Based on what you shared with me it looks like you most likely have a simple urinary tract infection.  A UTI (Urinary Tract Infection) is a bacterial infection of the bladder.  Most cases of urinary tract infections are simple to treat but a key part of your care is to encourage you to drink plenty of fluids and watch your symptoms carefully.  I have prescribed MacroBid 100 mg twice a day for 5 days.  Your symptoms should gradually improve. Call us if the burning in your urine worsens, you develop worsening fever, back pain or pelvic pain or if your symptoms do not resolve after completing the antibiotic.  Urinary tract infections can be prevented by drinking plenty of water to keep your body hydrated.  Also be sure when you wipe, wipe from front to back and don't hold it in!  If possible, empty your bladder every 4 hours.  Your e-visit answers were reviewed by a board certified advanced clinical practitioner to complete your personal care plan.  Depending on the condition, your plan could have included both over the counter or prescription medications.  If there is a problem please reply  once you have received a response from your provider.  Your safety is important to [place].  If you have drug allergies check your prescription carefully.    You can use MyChart to ask questions about today's visit, request a non-urgent call back, or ask for a work or school excuse for 24 hours related to this e-Visit. If it has been greater than 24 hours you will need to follow up with your provider, or enter a new e-Visit to address those concerns.   You will get an e-mail in the next two days asking about your experience.  I hope that your e-visit has been valuable and will speed your recovery. Thank you for using e-visits.   I spent 5 minutes on this chart

## 2019-06-05 MED FILL — FLUCONAZOLE 150 MG TABS: 150 | 84 days supply | Qty: 6 | Fill #3

## 2019-06-05 MED FILL — TRIAMCINOLONE 0.1% CREAM: 0.1 | 10 days supply | Qty: 15 | Fill #1

## 2019-06-05 MED FILL — FENOFIBRATE 160 MG TABLET: 160 | 90 days supply | Qty: 90 | Fill #1

## 2019-06-21 ENCOUNTER — Other Ambulatory Visit: Payer: Self-pay

## 2019-06-22 ENCOUNTER — Encounter: Payer: Self-pay | Admitting: Gynecology

## 2019-06-22 ENCOUNTER — Ambulatory Visit (INDEPENDENT_AMBULATORY_CARE_PROVIDER_SITE_OTHER): Payer: 59 | Admitting: Gynecology

## 2019-06-22 VITALS — BP 116/72 | Ht 64.25 in | Wt 169.0 lb

## 2019-06-22 DIAGNOSIS — Z79890 Hormone replacement therapy (postmenopausal): Secondary | ICD-10-CM | POA: Diagnosis not present

## 2019-06-22 DIAGNOSIS — Z01419 Encounter for gynecological examination (general) (routine) without abnormal findings: Secondary | ICD-10-CM | POA: Diagnosis not present

## 2019-06-22 DIAGNOSIS — N952 Postmenopausal atrophic vaginitis: Secondary | ICD-10-CM | POA: Diagnosis not present

## 2019-06-22 MED ORDER — ESTRADIOL 1 MG PO TABS: 1.0000 mg | ORAL_TABLET | Freq: Every day | ORAL | 4 refills | Status: DC

## 2019-06-22 MED ORDER — FLUCONAZOLE 150 MG PO TABS: ORAL_TABLET | ORAL | 4 refills | Status: DC

## 2019-06-22 MED FILL — ESTRADIOL 1 MG TABS: 1 | 90 days supply | Qty: 90 | Fill #0

## 2019-06-22 NOTE — Progress Notes (Signed)
    Nicole Oliver Jan 04, 1966 [id number]        53 y.o.  G0P0 for annual gynecologic exam.  Without gynecologic complaints  Past medical history,surgical history, problem list, medications, allergies, family history and social history were all reviewed and documented as reviewed in the EPIC chart.  ROS:  Performed with pertinent positives and negatives included in the history, assessment and plan.   Additional significant findings : None   Exam: Surveyor, minerals Vitals:   06/22/19 0901  BP: 116/72  Weight: 169 lb (76.7 kg)  Height: 5' 4.25" (1.632 m)   Body mass index is 28.78 kg/m.  General appearance:  Normal affect, orientation and appearance. Skin: Grossly normal HEENT: Without gross lesions.  No cervical or supraclavicular adenopathy. Thyroid normal.  Lungs:  Clear without wheezing, rales or rhonchi Cardiac: RR, without RMG Abdominal:  Soft, nontender, without masses, guarding, rebound, organomegaly or hernia Breasts:  Examined lying and sitting without masses, retractions, discharge or axillary adenopathy. Pelvic:  Ext, BUS, Vagina: With mild atrophic changes  Adnexa: Without masses or tenderness    Anus and perineum: Normal   Rectovaginal: Normal sphincter tone without palpated masses or tenderness.    Assessment/Plan:  53 y.o. G0P0 female for annual gynecologic exam.  Status post TAH BSO 2005 for leiomyoma and endometriosis  1. Postmenopausal/HRT.  Switch to oral estradiol 1 mg last year due to patches causing irritation.  We again discussed the risks of ERT to include thrombosis such as stroke heart attack DVT and the breast cancer issue.  Benefits as far as cardiovascular and bone health when started earlier also reviewed.  Transdermal versus oral routes with first-pass effect benefits also discussed but she did not tolerate the patch and is going to remain on the pills.  Refill x1 year provided. 2. DEXA 2011 normal.  Plan repeat DEXA at age 20. 5. Colonoscopy due and  she is going to schedule this coming year. 4. Pap smear 05/2017.  No Pap smear done today.  No history of significant abnormal Pap smears.  Options to stop screening versus less frequent screening intervals reviewed based on hysterectomy history.  Will readdress on annual basis. 5. Mammography 07/2018.  Continue with annual mammography beginning of next year when due.  Breast exam normal today. 6. History of recurrent yeast infections.  Taking 1 Diflucan every several weeks which keeps her symptoms suppressed.  #6 with 4 refills provided 7. Health maintenance.  Patient has an appointment with her primary provider this coming spring and will do her lab work there.  Follow-up 1 year, sooner as needed.   [name] MD, 9:20 AM 06/22/2019

## 2019-06-22 NOTE — Patient Instructions (Signed)
Follow-up in 1 year for annual exam 

## 2019-07-20 DIAGNOSIS — Z0000 Encounter for general adult medical examination without abnormal findings: Secondary | ICD-10-CM | POA: Diagnosis not present

## 2019-07-20 DIAGNOSIS — E782 Mixed hyperlipidemia: Secondary | ICD-10-CM | POA: Diagnosis not present

## 2019-07-20 DIAGNOSIS — F411 Generalized anxiety disorder: Secondary | ICD-10-CM | POA: Diagnosis not present

## 2019-07-20 DIAGNOSIS — R030 Elevated blood-pressure reading, without diagnosis of hypertension: Secondary | ICD-10-CM | POA: Diagnosis not present

## 2019-07-20 DIAGNOSIS — Z1211 Encounter for screening for malignant neoplasm of colon: Secondary | ICD-10-CM | POA: Diagnosis not present

## 2019-08-29 ENCOUNTER — Other Ambulatory Visit (HOSPITAL_COMMUNITY): Payer: Self-pay | Admitting: Family Medicine

## 2019-08-29 MED FILL — TRIAMCINOLONE ACETONIDE 0.1: 0.1 | 5 days supply | Qty: 15 | Fill #0

## 2019-08-29 MED FILL — ALPRAZolam 0.5 MG TABS: 0.5 | 30 days supply | Qty: 60 | Fill #0

## 2019-08-29 MED FILL — FENOFIBRATE 160 MG TABLET: 160 | 90 days supply | Qty: 90 | Fill #0

## 2019-08-29 MED FILL — FLUCONAZOLE 150 MG TABS: 150 | 84 days supply | Qty: 6 | Fill #0

## 2019-08-30 ENCOUNTER — Other Ambulatory Visit: Payer: Self-pay

## 2019-08-30 NOTE — Telephone Encounter (Signed)
Encounter not needed

## 2019-09-09 DIAGNOSIS — Z23 Encounter for immunization: Secondary | ICD-10-CM | POA: Diagnosis not present

## 2019-10-07 DIAGNOSIS — Z23 Encounter for immunization: Secondary | ICD-10-CM | POA: Diagnosis not present

## 2019-10-23 ENCOUNTER — Other Ambulatory Visit: Payer: Self-pay | Admitting: Family Medicine

## 2019-10-23 ENCOUNTER — Other Ambulatory Visit: Payer: Self-pay | Admitting: Obstetrics and Gynecology

## 2019-10-23 DIAGNOSIS — Z1231 Encounter for screening mammogram for malignant neoplasm of breast: Secondary | ICD-10-CM

## 2019-10-24 MED FILL — ALPRAZolam 0.5 MG TABS: 0.5 | 30 days supply | Qty: 60 | Fill #0

## 2019-10-24 MED FILL — ESTRADIOL 1 MG TABS: 1 | 90 days supply | Qty: 90 | Fill #1

## 2019-10-24 MED FILL — TRIAMCINOLONE ACETONIDE 0.1: 0.1 | 5 days supply | Qty: 15 | Fill #1

## 2019-10-31 ENCOUNTER — Ambulatory Visit
Admission: RE | Admit: 2019-10-31 | Discharge: 2019-10-31 | Disposition: A | Discharge status: Home / Self Care | Payer: 59 | Source: Ambulatory Visit | Attending: Obstetrics and Gynecology | Admitting: Obstetrics and Gynecology

## 2019-10-31 ENCOUNTER — Other Ambulatory Visit: Payer: Self-pay

## 2019-10-31 DIAGNOSIS — Z1231 Encounter for screening mammogram for malignant neoplasm of breast: Secondary | ICD-10-CM | POA: Diagnosis not present

## 2019-12-18 MED FILL — ALPRAZolam 0.5 MG TABS: 0.5 | 30 days supply | Qty: 60 | Fill #0

## 2019-12-18 MED FILL — TRIAMCINOLONE ACETONIDE 0.1: 0.1 | 10 days supply | Qty: 15 | Fill #2

## 2020-02-08 ENCOUNTER — Other Ambulatory Visit (HOSPITAL_COMMUNITY): Payer: Self-pay | Admitting: Family Medicine

## 2020-02-08 MED FILL — ESTRADIOL 1 MG TABS: 1 | 90 days supply | Qty: 90 | Fill #2

## 2020-02-08 MED FILL — TRIAMCINOLONE ACETONIDE 0.1: 0.1 | 7 days supply | Qty: 15 | Fill #0

## 2020-02-08 MED FILL — FLUCONAZOLE 150 MG TABS: 150 | 84 days supply | Qty: 6 | Fill #1

## 2020-02-08 MED FILL — ALPRAZolam 0.5 MG TABS: 0.5 | 30 days supply | Qty: 60 | Fill #0

## 2020-02-08 MED FILL — FENOFIBRATE 160 MG TABLET: 160 | 90 days supply | Qty: 90 | Fill #1

## 2020-03-20 ENCOUNTER — Telehealth: Payer: 59 | Admitting: Family

## 2020-03-20 DIAGNOSIS — L255 Unspecified contact dermatitis due to plants, except food: Secondary | ICD-10-CM | POA: Diagnosis not present

## 2020-03-20 MED ORDER — TRIAMCINOLONE ACETONIDE 0.5 % EX OINT: 1.0000 "application " | TOPICAL_OINTMENT | Freq: Two times a day (BID) | CUTANEOUS | 0 refills | Status: DC

## 2020-03-20 MED ORDER — PREDNISONE 10 MG PO TABS: ORAL_TABLET | ORAL | 0 refills | Status: DC

## 2020-03-20 NOTE — Progress Notes (Signed)

## 2020-03-29 DIAGNOSIS — R233 Spontaneous ecchymoses: Secondary | ICD-10-CM | POA: Diagnosis not present

## 2020-03-29 DIAGNOSIS — Z23 Encounter for immunization: Secondary | ICD-10-CM | POA: Diagnosis not present

## 2020-04-02 DIAGNOSIS — R232 Flushing: Secondary | ICD-10-CM | POA: Diagnosis not present

## 2020-04-02 DIAGNOSIS — E559 Vitamin D deficiency, unspecified: Secondary | ICD-10-CM | POA: Diagnosis not present

## 2020-05-10 ENCOUNTER — Other Ambulatory Visit (HOSPITAL_COMMUNITY): Payer: Self-pay | Admitting: Family Medicine

## 2020-05-10 MED FILL — FLUCONAZOLE 150 MG TABS: 150 | 84 days supply | Qty: 6 | Fill #2

## 2020-05-10 MED FILL — TRIAMCINOLONE 0.1% CREAM: 0.1 | 7 days supply | Qty: 15 | Fill #1

## 2020-05-10 MED FILL — ESTRADIOL 1 MG TABS: 1 | 90 days supply | Qty: 90 | Fill #3

## 2020-05-10 MED FILL — FENOFIBRATE 160 MG TABLET: 160 | 90 days supply | Qty: 90 | Fill #2

## 2020-05-11 MED FILL — ALPRAZolam 0.5 MG TABS: 0.5 | 30 days supply | Qty: 60 | Fill #0

## 2020-06-24 ENCOUNTER — Encounter: Payer: 59 | Admitting: Obstetrics and Gynecology

## 2020-07-09 ENCOUNTER — Other Ambulatory Visit: Payer: Self-pay | Admitting: Family

## 2020-07-09 ENCOUNTER — Telehealth: Payer: 59 | Admitting: Family

## 2020-07-09 DIAGNOSIS — R399 Unspecified symptoms and signs involving the genitourinary system: Secondary | ICD-10-CM | POA: Diagnosis not present

## 2020-07-09 MED ORDER — CEPHALEXIN 500 MG PO CAPS: 500.0000 mg | ORAL_CAPSULE | Freq: Two times a day (BID) | ORAL | 0 refills | Status: DC

## 2020-07-09 MED FILL — CEPHALEXIN 500 MG CAPSULE: 500 | 7 days supply | Qty: 14 | Fill #0

## 2020-07-09 NOTE — Progress Notes (Signed)

## 2020-08-08 ENCOUNTER — Other Ambulatory Visit: Payer: Self-pay | Admitting: Obstetrics & Gynecology

## 2020-08-08 ENCOUNTER — Telehealth: Payer: Self-pay | Admitting: *Deleted

## 2020-08-08 MED ORDER — FLUCONAZOLE 150 MG PO TABS: ORAL_TABLET | ORAL | 0 refills | Status: DC

## 2020-08-08 MED ORDER — ESTRADIOL 1 MG PO TABS: 1.0000 mg | ORAL_TABLET | Freq: Every day | ORAL | 0 refills | Status: DC

## 2020-08-08 NOTE — Telephone Encounter (Signed)
Patient contacted office through e-mail due to phone issues.  Desires to schedule annual exam and requests refill on Estradiol and Diflucan.  Annual exam scheduled with Dr Dellis Filbert for 08-13-20. Last annual 06-22-19. Refills sent.

## 2020-08-09 ENCOUNTER — Other Ambulatory Visit (HOSPITAL_COMMUNITY): Payer: Self-pay | Admitting: Family Medicine

## 2020-08-09 MED FILL — FENOFIBRATE 160 MG TABLET: 160 | 90 days supply | Qty: 90 | Fill #0

## 2020-08-09 MED FILL — FLUCONAZOLE 150 MG TABS: 150 | 84 days supply | Qty: 6 | Fill #0

## 2020-08-09 MED FILL — ESTRADIOL 1 MG TABLET: 1 | 90 days supply | Qty: 90 | Fill #0

## 2020-08-09 MED FILL — ALPRAZolam 0.5 MG TABS: 0.5 | 30 days supply | Qty: 60 | Fill #0

## 2020-08-10 NOTE — Telephone Encounter (Signed)
Will refill at Annual Exam 08/13/2020.

## 2020-08-13 ENCOUNTER — Ambulatory Visit (INDEPENDENT_AMBULATORY_CARE_PROVIDER_SITE_OTHER): Payer: 59 | Admitting: Obstetrics & Gynecology

## 2020-08-13 ENCOUNTER — Encounter: Payer: Self-pay | Admitting: Obstetrics & Gynecology

## 2020-08-13 ENCOUNTER — Other Ambulatory Visit: Payer: Self-pay

## 2020-08-13 ENCOUNTER — Other Ambulatory Visit: Payer: Self-pay | Admitting: Obstetrics & Gynecology

## 2020-08-13 VITALS — BP 124/80 | Ht 64.5 in | Wt 169.0 lb

## 2020-08-13 DIAGNOSIS — Z79890 Hormone replacement therapy (postmenopausal): Secondary | ICD-10-CM | POA: Diagnosis not present

## 2020-08-13 DIAGNOSIS — R300 Dysuria: Secondary | ICD-10-CM

## 2020-08-13 DIAGNOSIS — N952 Postmenopausal atrophic vaginitis: Secondary | ICD-10-CM | POA: Diagnosis not present

## 2020-08-13 DIAGNOSIS — Z01419 Encounter for gynecological examination (general) (routine) without abnormal findings: Secondary | ICD-10-CM | POA: Diagnosis not present

## 2020-08-13 MED ORDER — ESTRADIOL 1 MG PO TABS: 1.0000 mg | ORAL_TABLET | Freq: Every day | ORAL | 4 refills | Status: DC

## 2020-08-13 NOTE — Progress Notes (Signed)
Nicole Oliver 1966/03/29 [id number]   History:    55 y.o. G0  RP:  Established patient presenting for annual gyn exam   HPI:  Status post TAH BSO 2005 for leiomyoma and endometriosis.  Postmenopausal/HRT.  Switch to oral estradiol 1 mg 2 yrs ago due to patches causing irritation. Pap smear 05/2017 was negative. C/O dysuria.  BMs normal. Breasts normal. BMI 28.56.  Exercising.  Health labs here.  Past medical history,surgical history, family history and social history were all reviewed and documented in the EPIC chart.  Gynecologic History Patient's last menstrual period was 06/23/2003.  Obstetric History OB History  Gravida Para Term Preterm AB Living  0            SAB IAB Ectopic Multiple Live Births                ROS: A ROS was performed and pertinent positives and negatives are included in the history.  GENERAL: No fevers or chills. HEENT: No change in vision, no earache, sore throat or sinus congestion. NECK: No pain or stiffness. CARDIOVASCULAR: No chest pain or pressure. No palpitations. PULMONARY: No shortness of breath, cough or wheeze. GASTROINTESTINAL: No abdominal pain, nausea, vomiting or diarrhea, melena or bright red blood per rectum. GENITOURINARY: No urinary frequency, urgency, hesitancy or dysuria. MUSCULOSKELETAL: No joint or muscle pain, no back pain, no recent trauma. DERMATOLOGIC: No rash, no itching, no lesions. ENDOCRINE: No polyuria, polydipsia, no heat or cold intolerance. No recent change in weight. HEMATOLOGICAL: No anemia or easy bruising or bleeding. NEUROLOGIC: No headache, seizures, numbness, tingling or weakness. PSYCHIATRIC: No depression, no loss of interest in normal activity or change in sleep pattern.     Exam:   BP 124/80   Ht 5' 4.5" (1.638 m)   Wt 169 lb (76.7 kg)   LMP 06/23/2003   BMI 28.56 kg/m   Body mass index is 28.56 kg/m.  General appearance : Well developed well nourished female. No acute distress HEENT: Eyes: no retinal  hemorrhage or exudates,  Neck supple, trachea midline, no carotid bruits, no thyroidmegaly Lungs: Clear to auscultation, no rhonchi or wheezes, or rib retractions  Heart: Regular rate and rhythm, no murmurs or gallops Breast:Examined in sitting and supine position were symmetrical in appearance, no palpable masses or tenderness,  no skin retraction, no nipple inversion, no nipple discharge, no skin discoloration, no axillary or supraclavicular lymphadenopathy Abdomen: no palpable masses or tenderness, no rebound or guarding Extremities: no edema or skin discoloration or tenderness  Pelvic: Vulva: Normal             Vagina: No gross lesions or discharge  Cervix/Uterus absent  Adnexa  Without masses or tenderness  Anus: Normal  U/A:  Negative   Assessment/Plan:  55 y.o. female for annual exam   1. Well female exam with routine gynecological exam Gynecologic exam s/p TAH/BSO for Fibroids/Endometriosis.  No need for a Pap test at this time.  Breasts normal. Mammo Neg 10/2019.  [place] scheduling.  Bone density normal previously, will repeat at age 34.  F/U Fasting health labs.  BMI 28.56.  Healthy nutrition, fitness to continue. - CBC; Future - Comp Met (CMET); Future - TSH; Future - Lipid Profile; Future - Vitamin D 1,25 dihydroxy; Future  2. Dysuria U/A Neg.  Reassured. - Urinalysis,Complete w/RFL Culture  3. Hormone replacement therapy (HRT) Continue on Estradiol 1 mg PO daily. Well on Estradiol 1 mg tab PO daily.  Patient understands the risks of  ERT including thrombosis such as stroke heart attack DVT and the breast cancer issue.  Benefits as far as cardiovascular and bone health when started earlier also reviewed.  Transdermal versus oral routes with first-pass effect benefits also discussed but she did not tolerate the patch and is going to remain on the pills.  Refill x1 year provided.  4. Postmenopausal atrophic vaginitis Well on Estradiol 1 mg tab PO daily. Will use coconut oil  as needed.  Other orders - estradiol (ESTRACE) 1 MG tablet; Take 1 tablet (1 mg total) by mouth daily.  Princess Bruins MD, 4:07 PM 08/13/2020

## 2020-08-14 ENCOUNTER — Encounter: Payer: Self-pay | Admitting: Obstetrics & Gynecology

## 2020-08-14 LAB — URINALYSIS, COMPLETE W/RFL CULTURE
Bacteria, UA: NONE SEEN /HPF
Bilirubin Urine: NEGATIVE
Glucose, UA: NEGATIVE
Hgb urine dipstick: NEGATIVE
Hyaline Cast: NONE SEEN /LPF
Ketones, ur: NEGATIVE
Leukocyte Esterase: NEGATIVE
Nitrites, Initial: NEGATIVE
Protein, ur: NEGATIVE
RBC / HPF: NONE SEEN /HPF (ref 0–2)
Specific Gravity, Urine: 1.01 (ref 1.001–1.03)
pH: 6 (ref 5.0–8.0)

## 2020-09-12 ENCOUNTER — Other Ambulatory Visit (HOSPITAL_BASED_OUTPATIENT_CLINIC_OR_DEPARTMENT_OTHER): Payer: Self-pay

## 2020-10-08 ENCOUNTER — Other Ambulatory Visit (HOSPITAL_COMMUNITY): Payer: Self-pay

## 2020-11-07 ENCOUNTER — Other Ambulatory Visit: Payer: Self-pay | Admitting: Obstetrics & Gynecology

## 2020-11-07 ENCOUNTER — Other Ambulatory Visit (HOSPITAL_COMMUNITY): Payer: Self-pay

## 2020-11-07 MED ORDER — FLUCONAZOLE 150 MG PO TABS
ORAL_TABLET | ORAL | 0 refills | Status: DC
  Filled 2020-11-07: qty 6, 90d supply, fill #0

## 2020-11-07 MED FILL — Estradiol Tab 1 MG: ORAL | 90 days supply | Qty: 90 | Fill #0 | Status: AC

## 2020-11-07 NOTE — Telephone Encounter (Signed)
Per Dr.Fontaine note on 06/10/21   "History of recurrent yeast infections.  Taking 1 Diflucan every several weeks which keeps her symptoms suppressed.  #6 with 4 refills provided

## 2020-11-08 ENCOUNTER — Other Ambulatory Visit (HOSPITAL_COMMUNITY): Payer: Self-pay

## 2020-11-08 MED ORDER — ALPRAZOLAM 0.5 MG PO TABS
0.2500 mg | ORAL_TABLET | Freq: Two times a day (BID) | ORAL | 0 refills | Status: DC | PRN
Start: 2020-11-08 — End: 2021-12-30
  Filled 2020-11-08: qty 60, 30d supply, fill #0

## 2020-11-08 MED ORDER — FENOFIBRATE 160 MG PO TABS
ORAL_TABLET | ORAL | 3 refills | Status: DC
  Filled 2020-11-08: qty 90, 90d supply, fill #0
  Filled 2021-01-30: qty 90, 90d supply, fill #1
  Filled 2021-05-05: qty 90, 90d supply, fill #2
  Filled 2021-07-07: qty 90, 90d supply, fill #3

## 2020-11-11 ENCOUNTER — Other Ambulatory Visit (HOSPITAL_COMMUNITY): Payer: Self-pay

## 2020-11-19 ENCOUNTER — Other Ambulatory Visit (HOSPITAL_COMMUNITY): Payer: Self-pay

## 2020-11-19 MED ORDER — CARESTART COVID-19 HOME TEST VI KIT
PACK | 0 refills | Status: DC
  Filled 2020-11-19: qty 4, 4d supply, fill #0

## 2020-11-27 ENCOUNTER — Other Ambulatory Visit: Payer: Self-pay | Admitting: Obstetrics & Gynecology

## 2020-11-27 DIAGNOSIS — Z1231 Encounter for screening mammogram for malignant neoplasm of breast: Secondary | ICD-10-CM

## 2020-12-25 DIAGNOSIS — D123 Benign neoplasm of transverse colon: Secondary | ICD-10-CM | POA: Diagnosis not present

## 2020-12-25 DIAGNOSIS — D120 Benign neoplasm of cecum: Secondary | ICD-10-CM | POA: Diagnosis not present

## 2020-12-25 DIAGNOSIS — Z1211 Encounter for screening for malignant neoplasm of colon: Secondary | ICD-10-CM | POA: Diagnosis not present

## 2021-01-21 ENCOUNTER — Ambulatory Visit
Admission: RE | Admit: 2021-01-21 | Discharge: 2021-01-21 | Disposition: A | Discharge status: Home / Self Care | Payer: 59 | Source: Ambulatory Visit | Attending: Obstetrics & Gynecology | Admitting: Obstetrics & Gynecology

## 2021-01-21 ENCOUNTER — Other Ambulatory Visit: Payer: Self-pay

## 2021-01-21 DIAGNOSIS — Z1231 Encounter for screening mammogram for malignant neoplasm of breast: Secondary | ICD-10-CM

## 2021-01-30 ENCOUNTER — Other Ambulatory Visit: Payer: Self-pay | Admitting: Obstetrics & Gynecology

## 2021-01-30 ENCOUNTER — Other Ambulatory Visit (HOSPITAL_COMMUNITY): Payer: Self-pay

## 2021-01-30 MED ORDER — ALPRAZOLAM 0.5 MG PO TABS
0.2500 mg | ORAL_TABLET | ORAL | 0 refills | Status: DC
  Filled 2021-01-30: qty 60, 30d supply, fill #0

## 2021-01-30 MED FILL — Estradiol Tab 1 MG: ORAL | 90 days supply | Qty: 90 | Fill #1 | Status: AC

## 2021-01-30 NOTE — Telephone Encounter (Signed)
AEX 08/13/2020.

## 2021-01-31 ENCOUNTER — Other Ambulatory Visit (HOSPITAL_COMMUNITY): Payer: Self-pay

## 2021-01-31 MED ORDER — FLUCONAZOLE 150 MG PO TABS
ORAL_TABLET | ORAL | 2 refills | Status: DC
  Filled 2021-01-31: qty 6, 84d supply, fill #0
  Filled 2021-05-05: qty 6, 84d supply, fill #1
  Filled 2021-08-06: qty 6, 84d supply, fill #2

## 2021-02-03 ENCOUNTER — Other Ambulatory Visit (HOSPITAL_COMMUNITY): Payer: Self-pay

## 2021-03-26 ENCOUNTER — Other Ambulatory Visit (HOSPITAL_COMMUNITY): Payer: Self-pay

## 2021-03-26 DIAGNOSIS — F411 Generalized anxiety disorder: Secondary | ICD-10-CM | POA: Diagnosis not present

## 2021-03-26 DIAGNOSIS — R635 Abnormal weight gain: Secondary | ICD-10-CM | POA: Diagnosis not present

## 2021-03-26 DIAGNOSIS — Z23 Encounter for immunization: Secondary | ICD-10-CM | POA: Diagnosis not present

## 2021-03-26 DIAGNOSIS — Z0000 Encounter for general adult medical examination without abnormal findings: Secondary | ICD-10-CM | POA: Diagnosis not present

## 2021-03-26 DIAGNOSIS — R030 Elevated blood-pressure reading, without diagnosis of hypertension: Secondary | ICD-10-CM | POA: Diagnosis not present

## 2021-03-26 DIAGNOSIS — E782 Mixed hyperlipidemia: Secondary | ICD-10-CM | POA: Diagnosis not present

## 2021-03-26 MED ORDER — FENOFIBRATE 160 MG PO TABS
ORAL_TABLET | ORAL | 4 refills | Status: DC
  Filled 2021-03-26 – 2021-10-10 (×3): qty 90, 90d supply, fill #0
  Filled 2022-02-02: qty 90, 90d supply, fill #1

## 2021-03-28 ENCOUNTER — Other Ambulatory Visit (HOSPITAL_COMMUNITY): Payer: Self-pay

## 2021-03-28 MED ORDER — LEVOTHYROXINE SODIUM 25 MCG PO TABS
25.0000 ug | ORAL_TABLET | Freq: Every morning | ORAL | 1 refills | Status: DC
  Filled 2021-03-28: qty 90, 90d supply, fill #0
  Filled 2021-06-25: qty 90, 90d supply, fill #1

## 2021-03-29 ENCOUNTER — Other Ambulatory Visit (HOSPITAL_COMMUNITY): Payer: Self-pay

## 2021-03-31 ENCOUNTER — Other Ambulatory Visit (HOSPITAL_COMMUNITY): Payer: Self-pay

## 2021-04-17 ENCOUNTER — Telehealth: Payer: 59 | Admitting: Physician Assistant

## 2021-04-17 DIAGNOSIS — Z20822 Contact with and (suspected) exposure to covid-19: Secondary | ICD-10-CM

## 2021-04-17 MED ORDER — BENZONATATE 100 MG PO CAPS: 100.0000 mg | ORAL_CAPSULE | Freq: Three times a day (TID) | ORAL | 0 refills | Status: DC | PRN

## 2021-04-17 MED ORDER — ALBUTEROL SULFATE HFA 108 (90 BASE) MCG/ACT IN AERS
2.0000 | INHALATION_SPRAY | Freq: Four times a day (QID) | RESPIRATORY_TRACT | 0 refills | Status: DC | PRN
Start: 2021-04-17 — End: 2021-12-30

## 2021-04-17 NOTE — Progress Notes (Signed)
I have spent 5 minutes in review of e-visit questionnaire, review and updating patient chart, medical decision making and response to patient.   Gershom Brobeck Cody Malasia Torain, PA-C    

## 2021-04-17 NOTE — Progress Notes (Signed)
E-Visit for Corona Virus Screening  Your current symptoms could be consistent with the coronavirus.  Many health care providers can now test patients at their office but not all are.  [place] has multiple testing sites. For information on our COVID testing locations and hours go to [email or web address]  We are enrolling you in our MyChart Home Monitoring for COVID19 . Daily you will receive a questionnaire within the MyChart website. Our COVID 19 response team will be monitoring your responses daily.  Testing Information: The COVID-19 Community Testing sites are testing BY APPOINTMENT ONLY.  You can schedule online at [email or web address]  If you do not have access to a smart phone or computer you may call [phone] for an appointment.   Additional testing sites in the Community:  For [facility] Testing sites in [place]  https://www.[facility]vs.com/minuteclinic/covid-19-testing  For Pop-up testing sites in [place]  https://covid19.ncdhhs.gov/about-covid-19/testing/find-my-testing-place/pop-testing-sites  For [facility] https://[place].gov[place]/human-services/health-department/coronavirus-covid-19-info/covid-19-testing  For [place] testing in [place] and [place] https://www.[place].gov/[place]/human-services/health-department/coronavirus-covid-19-info/covid-19-testing  For [facility] testing in [place]   https://[facility].care/covidtesting  For  more information about community testing call [phone]   Please quarantine yourself while awaiting your test results. Please stay home for a minimum of 10 days from the first day of illness with improving symptoms and you have had 24 hours of no fever (without the use of Tylenol (Acetaminophen) Motrin (Ibuprofen) or any fever reducing medication).  Also - Do not get tested prior to returning to work because once you have had a positive test the test can stay positive for  more than a month in some cases.   You should wear a mask or cloth face covering over your nose and mouth if you must be around other people or animals, including pets (even at home). Try to stay at least 6 feet away from other people. This will protect the people around you.  Please continue good preventive care measures, including:  frequent hand-washing, avoid touching your face, cover coughs/sneezes, stay out of crowds and keep a 6 foot distance from others.  COVID-19 is a respiratory illness with symptoms that are similar to the flu. Symptoms are typically mild to moderate, but there have been cases of severe illness and death due to the virus.   The following symptoms may appear 2-14 days after exposure: Fever Cough Shortness of breath or difficulty breathing Chills Repeated shaking with chills Muscle pain Headache Sore throat New loss of taste or smell Fatigue Congestion or runny nose Nausea or vomiting Diarrhea  Go to the nearest hospital ED for assessment if fever/cough/breathlessness are severe or illness seems like a threat to life.  It is vitally important that if you feel that you have an infection such as this virus or any other virus that you stay home and away from places where you may spread it to others.  You should avoid contact with people age 65 and older.   You can use medication such as prescription cough medication called Tessalon Perles 100 mg. You may take 1-2 capsules every 8 hours as needed for cough and  prescription inhaler called Albuterol MDI 90 mcg /actuation 2 puffs every 4 hours as needed for shortness of breath, wheezing, cough  You may also take acetaminophen (Tylenol) as needed for fever.  Reduce your risk of any infection by using the same precautions used for avoiding the common cold or flu:  Wash your hands often with soap and warm water for at least 20 seconds.  If soap and   water are not readily available, use an alcohol-based hand sanitizer with at  least 60% alcohol.  If coughing or sneezing, cover your mouth and nose by coughing or sneezing into the elbow areas of your shirt or coat, into a tissue or into your sleeve (not your hands). Avoid shaking hands with others and consider head nods or verbal greetings only. Avoid touching your eyes, nose, or mouth with unwashed hands.  Avoid close contact with people who are sick. Avoid places or events with large numbers of people in one location, like concerts or sporting events. Carefully consider travel plans you have or are making. If you are planning any travel outside or inside the [place], visit the CDC's Travelers' Health webpage for the latest health notices. If you have some symptoms but not all symptoms, continue to monitor at home and seek medical attention if your symptoms worsen. If you are having a medical emergency, call 911.  HOME CARE Only take medications as instructed by your medical team. Drink plenty of fluids and get plenty of rest. A steam or ultrasonic humidifier can help if you have congestion.   GET HELP RIGHT AWAY IF YOU HAVE EMERGENCY WARNING SIGNS** FOR COVID-19. If you or someone is showing any of these signs seek emergency medical care immediately. Call 911 or proceed to your closest emergency facility if: You develop worsening high fever. Trouble breathing Bluish lips or face Persistent pain or pressure in the chest New confusion Inability to wake or stay awake You cough up blood. Your symptoms become more severe  **This list is not all possible symptoms. Contact your medical provider for any symptoms that are sever or concerning to you.  MAKE SURE YOU  Understand these instructions. Will watch your condition. Will get help right away if you are not doing well or get worse.  Your e-visit answers were reviewed by a board certified advanced clinical practitioner to complete your personal care plan.  Depending on the condition, your plan could have included  both over the counter or prescription medications.  If there is a problem please reply once you have received a response from your provider.  Your safety is important to us.  If you have drug allergies check your prescription carefully.    You can use MyChart to ask questions about today's visit, request a non-urgent call back, or ask for a work or school excuse for 24 hours related to this e-Visit. If it has been greater than 24 hours you will need to follow up with your provider, or enter a new e-Visit to address those concerns. You will get an e-mail in the next two days asking about your experience.  I hope that your e-visit has been valuable and will speed your recovery. Thank you for using e-visits.   

## 2021-05-05 ENCOUNTER — Other Ambulatory Visit (HOSPITAL_COMMUNITY): Payer: Self-pay

## 2021-05-05 MED FILL — Estradiol Tab 1 MG: ORAL | 90 days supply | Qty: 90 | Fill #2 | Status: AC

## 2021-05-06 ENCOUNTER — Other Ambulatory Visit (HOSPITAL_COMMUNITY): Payer: Self-pay

## 2021-05-06 MED ORDER — ALPRAZOLAM 0.5 MG PO TABS
ORAL_TABLET | ORAL | 0 refills | Status: DC
  Filled 2021-05-06: qty 60, 30d supply, fill #0

## 2021-05-14 ENCOUNTER — Other Ambulatory Visit (HOSPITAL_COMMUNITY): Payer: Self-pay

## 2021-05-20 ENCOUNTER — Other Ambulatory Visit (HOSPITAL_COMMUNITY): Payer: Self-pay

## 2021-05-23 DIAGNOSIS — E039 Hypothyroidism, unspecified: Secondary | ICD-10-CM | POA: Diagnosis not present

## 2021-06-26 ENCOUNTER — Other Ambulatory Visit (HOSPITAL_COMMUNITY): Payer: Self-pay

## 2021-06-26 DIAGNOSIS — Z23 Encounter for immunization: Secondary | ICD-10-CM | POA: Diagnosis not present

## 2021-07-07 ENCOUNTER — Other Ambulatory Visit (HOSPITAL_COMMUNITY): Payer: Self-pay

## 2021-07-08 ENCOUNTER — Other Ambulatory Visit (HOSPITAL_COMMUNITY): Payer: Self-pay

## 2021-07-08 MED ORDER — ALPRAZOLAM 0.5 MG PO TABS
ORAL_TABLET | ORAL | 0 refills | Status: DC
  Filled 2021-07-08: qty 60, 30d supply, fill #0

## 2021-07-19 ENCOUNTER — Other Ambulatory Visit (HOSPITAL_COMMUNITY): Payer: Self-pay

## 2021-08-06 ENCOUNTER — Other Ambulatory Visit (HOSPITAL_COMMUNITY): Payer: Self-pay

## 2021-08-06 MED FILL — Estradiol Tab 1 MG: ORAL | 90 days supply | Qty: 90 | Fill #3 | Status: AC

## 2021-08-20 ENCOUNTER — Ambulatory Visit: Payer: 59 | Admitting: Obstetrics & Gynecology

## 2021-09-04 ENCOUNTER — Other Ambulatory Visit (HOSPITAL_COMMUNITY): Payer: Self-pay

## 2021-09-05 ENCOUNTER — Other Ambulatory Visit (HOSPITAL_COMMUNITY): Payer: Self-pay

## 2021-09-05 MED ORDER — ALPRAZOLAM 0.5 MG PO TABS
ORAL_TABLET | ORAL | 0 refills | Status: DC
  Filled 2021-09-05: qty 60, 30d supply, fill #0

## 2021-09-18 ENCOUNTER — Other Ambulatory Visit (HOSPITAL_COMMUNITY): Payer: Self-pay

## 2021-09-18 MED ORDER — LEVOTHYROXINE SODIUM 25 MCG PO TABS
ORAL_TABLET | ORAL | 2 refills | Status: DC
  Filled 2021-09-18: qty 90, 90d supply, fill #0
  Filled 2021-12-17: qty 90, 90d supply, fill #1
  Filled 2022-03-17: qty 90, 90d supply, fill #2

## 2021-09-19 ENCOUNTER — Other Ambulatory Visit (HOSPITAL_COMMUNITY): Payer: Self-pay

## 2021-10-07 ENCOUNTER — Other Ambulatory Visit (HOSPITAL_COMMUNITY): Payer: Self-pay

## 2021-10-10 ENCOUNTER — Other Ambulatory Visit (HOSPITAL_COMMUNITY): Payer: Self-pay

## 2021-10-23 ENCOUNTER — Ambulatory Visit: Payer: 59 | Admitting: Obstetrics & Gynecology

## 2021-11-05 ENCOUNTER — Other Ambulatory Visit (HOSPITAL_COMMUNITY): Payer: Self-pay

## 2021-11-05 ENCOUNTER — Other Ambulatory Visit: Payer: Self-pay | Admitting: Obstetrics & Gynecology

## 2021-11-05 MED ORDER — ALPRAZOLAM 0.5 MG PO TABS
ORAL_TABLET | ORAL | 0 refills | Status: DC
  Filled 2021-11-05: qty 60, 30d supply, fill #0

## 2021-11-05 NOTE — Telephone Encounter (Signed)
Last AEX 08/13/20--scheduled for 12/30/21. Per appt notes, pt r/s x2 ? ?Last mammo 01/21/21. ?

## 2021-11-07 ENCOUNTER — Other Ambulatory Visit (HOSPITAL_COMMUNITY): Payer: Self-pay

## 2021-11-07 NOTE — Telephone Encounter (Addendum)
Dr.Lavoie replied "Please inquire about the reason to reschedule x 2.  If no reason, will prescribe at the exam, especially that she is now >1 yr x last exam. "   I called patient and patient said she had to reschedule annual exam x2 due to work staffing (she works with [employer]) she is scheduled on 12/30/21.  Patient did apologize for rescheduling. She report the diflucan for recurrent yeast.

## 2021-11-07 NOTE — Addendum Note (Signed)
Addended by: Thamas Jaegers on: 11/07/2021 02:48 PM   Modules accepted: Orders

## 2021-11-08 ENCOUNTER — Other Ambulatory Visit: Payer: Self-pay | Admitting: Obstetrics & Gynecology

## 2021-11-08 ENCOUNTER — Other Ambulatory Visit (HOSPITAL_COMMUNITY): Payer: Self-pay

## 2021-11-10 ENCOUNTER — Other Ambulatory Visit (HOSPITAL_COMMUNITY): Payer: Self-pay

## 2021-11-10 NOTE — Telephone Encounter (Signed)
AEX scheduled 12/30/21. Last AEX 08/13/20.  Neg Mammo 01/21/21.

## 2021-11-12 ENCOUNTER — Other Ambulatory Visit (HOSPITAL_COMMUNITY): Payer: Self-pay

## 2021-11-12 MED ORDER — FLUCONAZOLE 150 MG PO TABS
ORAL_TABLET | ORAL | 3 refills | Status: AC
  Filled 2021-11-12: qty 6, 84d supply, fill #0
  Filled 2022-02-26: qty 6, 84d supply, fill #1
  Filled 2022-05-25: qty 6, 84d supply, fill #2
  Filled 2022-08-24: qty 6, 84d supply, fill #3

## 2021-11-12 MED ORDER — ESTRADIOL 1 MG PO TABS
ORAL_TABLET | Freq: Every day | ORAL | 0 refills | Status: DC
  Filled 2021-11-12: qty 90, 90d supply, fill #0

## 2021-11-12 NOTE — Telephone Encounter (Signed)
Patient called stating she has been without her hormones now for 15 days and is asking if you Dr. Marguerita Merles can refill asap.

## 2021-11-20 ENCOUNTER — Telehealth: Payer: 59 | Admitting: Physician Assistant

## 2021-11-20 DIAGNOSIS — B9689 Other specified bacterial agents as the cause of diseases classified elsewhere: Secondary | ICD-10-CM | POA: Diagnosis not present

## 2021-11-20 DIAGNOSIS — J019 Acute sinusitis, unspecified: Secondary | ICD-10-CM

## 2021-11-20 MED ORDER — BENZONATATE 100 MG PO CAPS: 100.0000 mg | ORAL_CAPSULE | Freq: Three times a day (TID) | ORAL | 0 refills | Status: DC | PRN

## 2021-11-20 MED ORDER — AMOXICILLIN-POT CLAVULANATE 875-125 MG PO TABS: 1.0000 | ORAL_TABLET | Freq: Two times a day (BID) | ORAL | 0 refills | Status: DC

## 2021-11-20 MED ORDER — ONDANSETRON 4 MG PO TBDP: 4.0000 mg | ORAL_TABLET | Freq: Three times a day (TID) | ORAL | 0 refills | Status: DC | PRN

## 2021-11-20 NOTE — Progress Notes (Signed)
E-Visit for Sinus Problems  We are sorry that you are not feeling well.  Here is how we plan to help!  Based on what you have shared with me it looks like you have sinusitis.  Sinusitis is inflammation and infection in the sinus cavities of the head.  Based on your presentation I believe you most likely have Acute Bacterial Sinusitis.  This is an infection caused by bacteria and is treated with antibiotics. I have prescribed Augmentin '875mg'$ /'125mg'$  one tablet twice daily with food, for 7 days. You may use an oral decongestant such as Mucinex D or if you have glaucoma or high blood pressure use plain Mucinex. Saline nasal spray help and can safely be used as often as needed for congestion.  I have also sent in Zofran for nausea and to prevent vomiting which is likely due to drainage and amount of coughing, and a prescription cough medication called Tessalon. If you develop worsening sinus pain, fever or notice severe headache and vision changes, or if symptoms are not better after completion of antibiotic, please schedule an appointment with a health care provider.    Sinus infections are not as easily transmitted as other respiratory infection, however we still recommend that you avoid close contact with loved ones, especially the very young and elderly.  Remember to wash your hands thoroughly throughout the day as this is the number one way to prevent the spread of infection!  Home Care: Only take medications as instructed by your medical team. Complete the entire course of an antibiotic. Do not take these medications with alcohol. A steam or ultrasonic humidifier can help congestion.  You can place a towel over your head and breathe in the steam from hot water coming from a faucet. Avoid close contacts especially the very young and the elderly. Cover your mouth when you cough or sneeze. Always remember to wash your hands.  Get Help Right Away If: You develop worsening fever or sinus pain. You  develop a severe head ache or visual changes. Your symptoms persist after you have completed your treatment plan.  Make sure you Understand these instructions. Will watch your condition. Will get help right away if you are not doing well or get worse.  Thank you for choosing an e-visit.  Your e-visit answers were reviewed by a board certified advanced clinical practitioner to complete your personal care plan. Depending upon the condition, your plan could have included both over the counter or prescription medications.  Please review your pharmacy choice. Make sure the pharmacy is open so you can pick up prescription now. If there is a problem, you may contact your provider through [employer] and have the prescription routed to another pharmacy.  Your safety is important to [place]. If you have drug allergies check your prescription carefully.   For the next 24 hours you can use MyChart to ask questions about today's visit, request a non-urgent call back, or ask for a work or school excuse. You will get an email in the next two days asking about your experience. I hope that your e-visit has been valuable and will speed your recovery.

## 2021-11-20 NOTE — Progress Notes (Signed)
I have spent 5 minutes in review of e-visit questionnaire, review and updating patient chart, medical decision making and response to patient.   Nicole Oliver Nicole Falicity Sheets, PA-C    

## 2021-12-18 ENCOUNTER — Other Ambulatory Visit (HOSPITAL_COMMUNITY): Payer: Self-pay

## 2021-12-30 ENCOUNTER — Other Ambulatory Visit (HOSPITAL_COMMUNITY)
Admission: RE | Admit: 2021-12-30 | Discharge: 2021-12-30 | Disposition: A | Discharge status: Home / Self Care | Payer: 59 | Source: Ambulatory Visit | Attending: Obstetrics & Gynecology | Admitting: Obstetrics & Gynecology

## 2021-12-30 ENCOUNTER — Other Ambulatory Visit (HOSPITAL_COMMUNITY): Payer: Self-pay

## 2021-12-30 ENCOUNTER — Encounter: Payer: Self-pay | Admitting: Obstetrics & Gynecology

## 2021-12-30 ENCOUNTER — Ambulatory Visit (INDEPENDENT_AMBULATORY_CARE_PROVIDER_SITE_OTHER): Payer: 59 | Admitting: Obstetrics & Gynecology

## 2021-12-30 VITALS — BP 122/82 | HR 74 | Ht 63.25 in | Wt 185.0 lb

## 2021-12-30 DIAGNOSIS — Z9079 Acquired absence of other genital organ(s): Secondary | ICD-10-CM

## 2021-12-30 DIAGNOSIS — Z79890 Hormone replacement therapy (postmenopausal): Secondary | ICD-10-CM

## 2021-12-30 DIAGNOSIS — Z90710 Acquired absence of both cervix and uterus: Secondary | ICD-10-CM

## 2021-12-30 DIAGNOSIS — Z1272 Encounter for screening for malignant neoplasm of vagina: Secondary | ICD-10-CM | POA: Diagnosis not present

## 2021-12-30 DIAGNOSIS — Z90722 Acquired absence of ovaries, bilateral: Secondary | ICD-10-CM

## 2021-12-30 DIAGNOSIS — Z01419 Encounter for gynecological examination (general) (routine) without abnormal findings: Secondary | ICD-10-CM

## 2021-12-30 MED ORDER — ESTRADIOL 1 MG PO TABS
1.0000 mg | ORAL_TABLET | Freq: Every day | ORAL | 4 refills | Status: DC
  Filled 2021-12-30 – 2022-02-26 (×2): qty 90, 90d supply, fill #0
  Filled 2022-05-25: qty 90, 90d supply, fill #1
  Filled 2022-08-24: qty 90, 90d supply, fill #2
  Filled 2022-11-09: qty 90, 90d supply, fill #3

## 2021-12-30 NOTE — Progress Notes (Signed)
[place] 12-14-1965 [id number]   History:    56 y.o. G0   RP:  Established patient presenting for annual gyn exam    HPI:  Status post TAH BSO 2005 for leiomyoma and endometriosis.  Postmenopausal on HRT with Estradiol 1 mg PO daily.  Pap smear 05/2017 was negative.  Pap reflex today. Urine normal. BMs normal. Colono 2022.  Breasts normal.  Mammo 01/2021 Neg.  BMI increased at 32.51.  Exercising.  Health Labs with Fam MD.  Recent Dx of Hypothyroidism, started on Levothyroxine.  Past medical history,surgical history, family history and social history were all reviewed and documented in the EPIC chart.  Gynecologic History Patient's last menstrual period was 06/23/2003.  Obstetric History OB History  Gravida Para Term Preterm AB Living  0            SAB IAB Ectopic Multiple Live Births                ROS: A ROS was performed and pertinent positives and negatives are included in the history. GENERAL: No fevers or chills. HEENT: No change in vision, no earache, sore throat or sinus congestion. NECK: No pain or stiffness. CARDIOVASCULAR: No chest pain or pressure. No palpitations. PULMONARY: No shortness of breath, cough or wheeze. GASTROINTESTINAL: No abdominal pain, nausea, vomiting or diarrhea, melena or bright red blood per rectum. GENITOURINARY: No urinary frequency, urgency, hesitancy or dysuria. MUSCULOSKELETAL: No joint or muscle pain, no back pain, no recent trauma. DERMATOLOGIC: No rash, no itching, no lesions. ENDOCRINE: No polyuria, polydipsia, no heat or cold intolerance. No recent change in weight. HEMATOLOGICAL: No anemia or easy bruising or bleeding. NEUROLOGIC: No headache, seizures, numbness, tingling or weakness. PSYCHIATRIC: No depression, no loss of interest in normal activity or change in sleep pattern.     Exam:   BP 122/82   Pulse 74   Ht 5' 3.25" (1.607 m)   Wt 185 lb (83.9 kg)   LMP 06/23/2003   SpO2 98%   BMI 32.51 kg/m   Body mass index is 32.51  kg/m.  General appearance : Well developed well nourished female. No acute distress HEENT: Eyes: no retinal hemorrhage or exudates,  Neck supple, trachea midline, no carotid bruits, no thyroidmegaly Lungs: Clear to auscultation, no rhonchi or wheezes, or rib retractions  Heart: Regular rate and rhythm, no murmurs or gallops Breast:Examined in sitting and supine position were symmetrical in appearance, no palpable masses or tenderness,  no skin retraction, no nipple inversion, no nipple discharge, no skin discoloration, no axillary or supraclavicular lymphadenopathy Abdomen: no palpable masses or tenderness, no rebound or guarding Extremities: no edema or skin discoloration or tenderness  Pelvic: Vulva: Normal             Vagina: No gross lesions or discharge.  Pap reflex done.  Cervix/Uterus absent  Adnexa  Without masses or tenderness  Anus: Normal   Assessment/Plan:  56 y.o. female for annual exam   1. Encounter for Papanicolaou smear of vagina as part of routine gynecological examination Status post TAH BSO 2005 for leiomyoma and endometriosis.  Postmenopausal on HRT with Estradiol 1 mg PO daily.  Pap smear 05/2017 was negative.  Pap reflex today. Urine normal. BMs normal. Colono 2022.  Breasts normal.  Mammo 01/2021 Neg.  BMI increased at 32.51.  Exercising.  Health Labs with Fam MD.  Recent Dx of Hypothyroidism, started on Levothyroxine. - Cytology - PAP( [place])  2. S/P TAH-BSO  3. Postmenopausal hormone  replacement therapy Status post TAH BSO 2005 for leiomyoma and endometriosis.  Postmenopausal on HRT with Estradiol 1 mg PO daily.  No CI to continue on Estradiol same dosage.  Prescription sent to pharmacy.  Other orders - estradiol (ESTRACE) 1 MG tablet; TAKE 1 TABLET BY MOUTH DAILY.   Princess Bruins MD, 3:08 PM 12/30/2021

## 2022-01-01 LAB — CYTOLOGY - PAP: Diagnosis: NEGATIVE

## 2022-01-16 ENCOUNTER — Other Ambulatory Visit: Payer: Self-pay | Admitting: Obstetrics & Gynecology

## 2022-01-16 DIAGNOSIS — Z1231 Encounter for screening mammogram for malignant neoplasm of breast: Secondary | ICD-10-CM

## 2022-01-27 ENCOUNTER — Ambulatory Visit
Admission: RE | Admit: 2022-01-27 | Discharge: 2022-01-27 | Disposition: A | Discharge status: Home / Self Care | Payer: 59 | Source: Ambulatory Visit | Attending: Obstetrics & Gynecology | Admitting: Obstetrics & Gynecology

## 2022-01-27 DIAGNOSIS — Z1231 Encounter for screening mammogram for malignant neoplasm of breast: Secondary | ICD-10-CM

## 2022-02-02 ENCOUNTER — Other Ambulatory Visit (HOSPITAL_COMMUNITY): Payer: Self-pay

## 2022-02-03 ENCOUNTER — Other Ambulatory Visit (HOSPITAL_COMMUNITY): Payer: Self-pay

## 2022-02-03 MED ORDER — ALPRAZOLAM 0.5 MG PO TABS
ORAL_TABLET | ORAL | 0 refills | Status: DC
  Filled 2022-02-03: qty 60, 30d supply, fill #0

## 2022-02-18 DIAGNOSIS — R2232 Localized swelling, mass and lump, left upper limb: Secondary | ICD-10-CM | POA: Diagnosis not present

## 2022-02-18 DIAGNOSIS — M79642 Pain in left hand: Secondary | ICD-10-CM | POA: Diagnosis not present

## 2022-02-19 ENCOUNTER — Other Ambulatory Visit: Payer: Self-pay | Admitting: Physician Assistant

## 2022-02-19 DIAGNOSIS — R2232 Localized swelling, mass and lump, left upper limb: Secondary | ICD-10-CM

## 2022-02-20 ENCOUNTER — Ambulatory Visit
Admission: RE | Admit: 2022-02-20 | Discharge: 2022-02-20 | Disposition: A | Discharge status: Home / Self Care | Payer: 59 | Source: Ambulatory Visit | Attending: Physician Assistant | Admitting: Physician Assistant

## 2022-02-20 ENCOUNTER — Other Ambulatory Visit: Payer: 59

## 2022-02-20 DIAGNOSIS — R2232 Localized swelling, mass and lump, left upper limb: Secondary | ICD-10-CM | POA: Diagnosis not present

## 2022-02-25 DIAGNOSIS — M79642 Pain in left hand: Secondary | ICD-10-CM | POA: Diagnosis not present

## 2022-02-25 DIAGNOSIS — R2232 Localized swelling, mass and lump, left upper limb: Secondary | ICD-10-CM | POA: Diagnosis not present

## 2022-02-26 ENCOUNTER — Other Ambulatory Visit: Payer: Self-pay | Admitting: Orthopedic Surgery

## 2022-02-26 ENCOUNTER — Other Ambulatory Visit (HOSPITAL_COMMUNITY): Payer: Self-pay

## 2022-03-04 ENCOUNTER — Encounter (HOSPITAL_BASED_OUTPATIENT_CLINIC_OR_DEPARTMENT_OTHER): Payer: Self-pay | Admitting: Orthopedic Surgery

## 2022-03-04 ENCOUNTER — Other Ambulatory Visit: Payer: Self-pay

## 2022-03-10 NOTE — Progress Notes (Signed)

## 2022-03-12 ENCOUNTER — Ambulatory Visit (HOSPITAL_BASED_OUTPATIENT_CLINIC_OR_DEPARTMENT_OTHER): Payer: 59 | Admitting: Anesthesiology

## 2022-03-12 ENCOUNTER — Other Ambulatory Visit: Payer: Self-pay

## 2022-03-12 ENCOUNTER — Ambulatory Visit (HOSPITAL_BASED_OUTPATIENT_CLINIC_OR_DEPARTMENT_OTHER)
Admission: RE | Admit: 2022-03-12 | Discharge: 2022-03-12 | Disposition: A | Discharge status: Home / Self Care | Payer: 59 | Attending: Orthopedic Surgery | Admitting: Orthopedic Surgery

## 2022-03-12 ENCOUNTER — Encounter (HOSPITAL_BASED_OUTPATIENT_CLINIC_OR_DEPARTMENT_OTHER)
Admission: RE | Disposition: A | Discharge status: Home / Self Care | Payer: Self-pay | Source: Home / Self Care | Attending: Orthopedic Surgery

## 2022-03-12 ENCOUNTER — Other Ambulatory Visit (HOSPITAL_COMMUNITY): Payer: Self-pay

## 2022-03-12 ENCOUNTER — Encounter (HOSPITAL_BASED_OUTPATIENT_CLINIC_OR_DEPARTMENT_OTHER): Payer: Self-pay | Admitting: Orthopedic Surgery

## 2022-03-12 DIAGNOSIS — Z87891 Personal history of nicotine dependence: Secondary | ICD-10-CM | POA: Diagnosis not present

## 2022-03-12 DIAGNOSIS — R2232 Localized swelling, mass and lump, left upper limb: Secondary | ICD-10-CM | POA: Diagnosis not present

## 2022-03-12 DIAGNOSIS — I82602 Acute embolism and thrombosis of unspecified veins of left upper extremity: Secondary | ICD-10-CM | POA: Diagnosis not present

## 2022-03-12 DIAGNOSIS — Z01818 Encounter for other preprocedural examination: Secondary | ICD-10-CM

## 2022-03-12 HISTORY — PX: EXCISION METACARPAL MASS: SHX6372

## 2022-03-12 SURGERY — EXCISION METACARPAL MASS
Anesthesia: Regional | Site: Hand | Laterality: Left

## 2022-03-12 MED ORDER — MIDAZOLAM HCL 5 MG/5ML IJ SOLN
INTRAMUSCULAR | Status: DC | PRN
  Administered 2022-03-12: 2 mg via INTRAVENOUS

## 2022-03-12 MED ORDER — OXYCODONE HCL 5 MG/5ML PO SOLN: 5.0000 mg | Freq: Once | ORAL | Status: DC | PRN

## 2022-03-12 MED ORDER — LIDOCAINE 2% (20 MG/ML) 5 ML SYRINGE
INTRAMUSCULAR | Status: AC
  Filled 2022-03-12: qty 5

## 2022-03-12 MED ORDER — BUPIVACAINE HCL (PF) 0.25 % IJ SOLN
INTRAMUSCULAR | Status: DC | PRN
  Administered 2022-03-12: 7 mL

## 2022-03-12 MED ORDER — HYDROCODONE-ACETAMINOPHEN 5-325 MG PO TABS
1.0000 | ORAL_TABLET | Freq: Four times a day (QID) | ORAL | 0 refills | Status: DC | PRN
  Filled 2022-03-12: qty 10, 2d supply, fill #0

## 2022-03-12 MED ORDER — ACETAMINOPHEN 500 MG PO TABS
1000.0000 mg | ORAL_TABLET | Freq: Once | ORAL | Status: AC
  Administered 2022-03-12: 1000 mg via ORAL

## 2022-03-12 MED ORDER — OXYCODONE HCL 5 MG PO TABS: 5.0000 mg | ORAL_TABLET | Freq: Once | ORAL | Status: DC | PRN

## 2022-03-12 MED ORDER — ONDANSETRON HCL 4 MG/2ML IJ SOLN
INTRAMUSCULAR | Status: DC | PRN
  Administered 2022-03-12: 4 mg via INTRAVENOUS

## 2022-03-12 MED ORDER — LACTATED RINGERS IV SOLN: INTRAVENOUS | Status: DC

## 2022-03-12 MED ORDER — DEXAMETHASONE SODIUM PHOSPHATE 10 MG/ML IJ SOLN
INTRAMUSCULAR | Status: DC | PRN
  Administered 2022-03-12: 5 mg via INTRAVENOUS

## 2022-03-12 MED ORDER — CEFAZOLIN SODIUM-DEXTROSE 2-4 GM/100ML-% IV SOLN
2.0000 g | INTRAVENOUS | Status: AC
  Administered 2022-03-12: 2 g via INTRAVENOUS

## 2022-03-12 MED ORDER — FENTANYL CITRATE (PF) 100 MCG/2ML IJ SOLN
INTRAMUSCULAR | Status: DC | PRN
  Administered 2022-03-12 (×2): 50 ug via INTRAVENOUS

## 2022-03-12 MED ORDER — AMISULPRIDE (ANTIEMETIC) 5 MG/2ML IV SOLN: 10.0000 mg | Freq: Once | INTRAVENOUS | Status: DC | PRN

## 2022-03-12 MED ORDER — LIDOCAINE HCL (CARDIAC) PF 100 MG/5ML IV SOSY
PREFILLED_SYRINGE | INTRAVENOUS | Status: DC | PRN
  Administered 2022-03-12: 60 mg via INTRAVENOUS

## 2022-03-12 MED ORDER — KETOROLAC TROMETHAMINE 30 MG/ML IJ SOLN: 30.0000 mg | Freq: Once | INTRAMUSCULAR | Status: DC | PRN

## 2022-03-12 MED ORDER — MIDAZOLAM HCL 2 MG/2ML IJ SOLN
INTRAMUSCULAR | Status: AC
  Filled 2022-03-12: qty 2

## 2022-03-12 MED ORDER — CEFAZOLIN SODIUM-DEXTROSE 2-4 GM/100ML-% IV SOLN
INTRAVENOUS | Status: AC
  Filled 2022-03-12: qty 100

## 2022-03-12 MED ORDER — ACETAMINOPHEN 500 MG PO TABS
ORAL_TABLET | ORAL | Status: AC
  Filled 2022-03-12: qty 2

## 2022-03-12 MED ORDER — PROMETHAZINE HCL 25 MG/ML IJ SOLN: 6.2500 mg | INTRAMUSCULAR | Status: DC | PRN

## 2022-03-12 MED ORDER — ONDANSETRON HCL 4 MG/2ML IJ SOLN
INTRAMUSCULAR | Status: AC
  Filled 2022-03-12: qty 2

## 2022-03-12 MED ORDER — FENTANYL CITRATE (PF) 100 MCG/2ML IJ SOLN
INTRAMUSCULAR | Status: AC
  Filled 2022-03-12: qty 2

## 2022-03-12 MED ORDER — PROPOFOL 10 MG/ML IV BOLUS
INTRAVENOUS | Status: DC | PRN
  Administered 2022-03-12: 200 mg via INTRAVENOUS

## 2022-03-12 MED ORDER — FENTANYL CITRATE (PF) 100 MCG/2ML IJ SOLN: 25.0000 ug | INTRAMUSCULAR | Status: DC | PRN

## 2022-03-12 MED ORDER — DEXAMETHASONE SODIUM PHOSPHATE 10 MG/ML IJ SOLN
INTRAMUSCULAR | Status: AC
  Filled 2022-03-12: qty 1

## 2022-03-12 SURGICAL SUPPLY — 55 items
APL PRP STRL LF DISP 70% ISPRP (MISCELLANEOUS) ×1
APL SKNCLS STERI-STRIP NONHPOA (GAUZE/BANDAGES/DRESSINGS)
BANDAGE GAUZE 1X75IN STRL (MISCELLANEOUS) IMPLANT
BENZOIN TINCTURE PRP APPL 2/3 (GAUZE/BANDAGES/DRESSINGS) IMPLANT
BLADE MINI RND TIP GREEN BEAV (BLADE) IMPLANT
BLADE SURG 15 STRL LF DISP TIS (BLADE) ×2 IMPLANT
BLADE SURG 15 STRL SS (BLADE) ×2
BNDG CMPR 5X2 CHSV 1 LYR STRL (GAUZE/BANDAGES/DRESSINGS)
BNDG CMPR 75X11 PLY HI ABS (MISCELLANEOUS)
BNDG CMPR 75X21 PLY HI ABS (MISCELLANEOUS)
BNDG CMPR 9X4 STRL LF SNTH (GAUZE/BANDAGES/DRESSINGS)
BNDG COHESIVE 1X5 TAN STRL LF (GAUZE/BANDAGES/DRESSINGS) IMPLANT
BNDG COHESIVE 2X5 TAN ST LF (GAUZE/BANDAGES/DRESSINGS) IMPLANT
BNDG ELASTIC 2X5.8 VLCR STR LF (GAUZE/BANDAGES/DRESSINGS) IMPLANT
BNDG ELASTIC 3X5.8 VLCR STR LF (GAUZE/BANDAGES/DRESSINGS) IMPLANT
BNDG ESMARK 4X9 LF (GAUZE/BANDAGES/DRESSINGS) IMPLANT
BNDG GAUZE 1X75IN STRL (MISCELLANEOUS)
BNDG GAUZE DERMACEA FLUFF 4 (GAUZE/BANDAGES/DRESSINGS) IMPLANT
BNDG GZE DERMACEA 4 6PLY (GAUZE/BANDAGES/DRESSINGS)
BNDG PLASTER X FAST 3X3 WHT LF (CAST SUPPLIES) IMPLANT
BNDG PLSTR 9X3 FST ST WHT (CAST SUPPLIES)
CHLORAPREP W/TINT 26 (MISCELLANEOUS) ×1 IMPLANT
CORD BIPOLAR FORCEPS 12FT (ELECTRODE) ×1 IMPLANT
COVER BACK TABLE 60X90IN (DRAPES) ×1 IMPLANT
COVER MAYO STAND STRL (DRAPES) ×1 IMPLANT
CUFF TOURN SGL QUICK 18X4 (TOURNIQUET CUFF) ×1 IMPLANT
DRAPE EXTREMITY T 121X128X90 (DISPOSABLE) ×1 IMPLANT
DRAPE SURG 17X23 STRL (DRAPES) ×1 IMPLANT
GAUZE SPONGE 4X4 12PLY STRL (GAUZE/BANDAGES/DRESSINGS) ×1 IMPLANT
GAUZE STRETCH 2X75IN STRL (MISCELLANEOUS) IMPLANT
GAUZE XEROFORM 1X8 LF (GAUZE/BANDAGES/DRESSINGS) ×1 IMPLANT
GLOVE BIO SURGEON STRL SZ7.5 (GLOVE) ×1 IMPLANT
GLOVE BIOGEL PI IND STRL 8 (GLOVE) ×1 IMPLANT
GLOVE BIOGEL PI IND STRL 8.5 (GLOVE) IMPLANT
GLOVE SURG ORTHO 8.0 STRL STRW (GLOVE) IMPLANT
GOWN STRL REUS W/ TWL LRG LVL3 (GOWN DISPOSABLE) ×1 IMPLANT
GOWN STRL REUS W/TWL LRG LVL3 (GOWN DISPOSABLE) ×1
GOWN STRL REUS W/TWL XL LVL3 (GOWN DISPOSABLE) ×1 IMPLANT
NDL HYPO 25X1 1.5 SAFETY (NEEDLE) ×1 IMPLANT
NEEDLE HYPO 25X1 1.5 SAFETY (NEEDLE) ×1 IMPLANT
NS IRRIG 1000ML POUR BTL (IV SOLUTION) ×1 IMPLANT
PACK BASIN DAY SURGERY FS (CUSTOM PROCEDURE TRAY) ×1 IMPLANT
PAD CAST 3X4 CTTN HI CHSV (CAST SUPPLIES) IMPLANT
PAD CAST 4YDX4 CTTN HI CHSV (CAST SUPPLIES) IMPLANT
PADDING CAST ABS COTTON 4X4 ST (CAST SUPPLIES) ×1 IMPLANT
PADDING CAST COTTON 3X4 STRL (CAST SUPPLIES)
PADDING CAST COTTON 4X4 STRL (CAST SUPPLIES)
STOCKINETTE 4X48 STRL (DRAPES) ×1 IMPLANT
STRIP CLOSURE SKIN 1/2X4 (GAUZE/BANDAGES/DRESSINGS) IMPLANT
SUT ETHILON 3 0 PS 1 (SUTURE) IMPLANT
SUT ETHILON 4 0 PS 2 18 (SUTURE) ×1 IMPLANT
SYR BULB EAR ULCER 3OZ GRN STR (SYRINGE) ×1 IMPLANT
SYR CONTROL 10ML LL (SYRINGE) ×1 IMPLANT
TOWEL GREEN STERILE FF (TOWEL DISPOSABLE) ×2 IMPLANT
UNDERPAD 30X36 HEAVY ABSORB (UNDERPADS AND DIAPERS) ×1 IMPLANT

## 2022-03-12 NOTE — Discharge Instructions (Addendum)
[facility] Instructions Hand Surgery  Wound Care: Keep your hand elevated above the level of your heart.  Do not allow it to dangle by your side.  Keep the dressing dry and do not remove it unless your doctor advises you to do so.  He will usually change it at the time of your post-op visit.  Moving your fingers is advised to stimulate circulation but will depend on the site of your surgery.  If you have a splint applied, your doctor will advise you regarding movement.  Activity: Do not drive or operate machinery today.  Rest today and then you may return to your normal activity and work as indicated by your physician.  Diet:  Drink liquids today or eat a light diet.  You may resume a regular diet tomorrow.    General expectations: Pain for two to three days. Fingers may become slightly swollen.  Call your doctor if any of the following occur: Severe pain not relieved by pain medication. Elevated temperature. Dressing soaked with blood. Inability to move fingers. White or bluish color to fingers.   No tylenol until 4:30pm  Post Anesthesia Home Care Instructions  Activity: Get plenty of rest for the remainder of the day. A responsible individual must stay with you for 24 hours following the procedure.  For the next 24 hours, DO NOT: -Drive a car -Paediatric nurse -Drink alcoholic beverages -Take any medication unless instructed by your physician -Make any legal decisions or sign important papers.  Meals: Start with liquid foods such as gelatin or soup. Progress to regular foods as tolerated. Avoid greasy, spicy, heavy foods. If nausea and/or vomiting occur, drink only clear liquids until the nausea and/or vomiting subsides. Call your physician if vomiting continues.  Special Instructions/Symptoms: Your throat may feel dry or sore from the anesthesia or the breathing tube placed in your throat during surgery. If this causes discomfort, gargle with warm salt water. The  discomfort should disappear within 24 hours.  If you had a scopolamine patch placed behind your ear for the management of post- operative nausea and/or vomiting:  1. The medication in the patch is effective for 72 hours, after which it should be removed.  Wrap patch in a tissue and discard in the trash. Wash hands thoroughly with soap and water. 2. You may remove the patch earlier than 72 hours if you experience unpleasant side effects which may include dry mouth, dizziness or visual disturbances. 3. Avoid touching the patch. Wash your hands with soap and water after contact with the patch.

## 2022-03-12 NOTE — H&P (Signed)
Nicole Oliver is an 56 y.o. female.   Chief Complaint: finger mass HPI: 56 yo female with mass left ring finger.  It is bothersome to her.  She wishes to have it removed.  Allergies:  Allergies  Allergen Reactions   Sulfa Antibiotics Nausea Only   Prozac [Fluoxetine]     Pt stated, "Made me get headaches"    Past Medical History:  Diagnosis Date   ACL tear 12/21/2014   left   Anxiety    Dental crown present    Hyperlipidemia     Past Surgical History:  Procedure Laterality Date   ABDOMINAL HYSTERECTOMY     TAH BSO   KNEE ARTHROSCOPY WITH ANTERIOR CRUCIATE LIGAMENT (ACL) REPAIR Left 01/10/2015   Procedure: KNEE ARTHROSCOPY WITH ANTERIOR CRUCIATE LIGAMENT (ACL) RECONSTRUCTION WITH BONE-TENDON-BONE (PATELLAR TENDON) ALLOGRAFT.    ARTHROSCOPIC ACL RECONSTRUCTION WITH BONE-TENDON-BONE (PATELLAR TENDON) ALLOGRAFT, '5\' 5"'$ , 170#.;  Surgeon: Garald Balding, MD;  Location: [facility];  Service: Orthopedics;  Laterality: Left;  ARTHROSCOPIC ACL RECONSTRUCTION WITH BONE-TENDON-BONE (   NASAL RECONSTRUCTION WITH SEPTAL REPAIR     STRABISMUS SURGERY     TOTAL ABDOMINAL HYSTERECTOMY W/ BILATERAL SALPINGOOPHORECTOMY  06/25/2003    Family History: Family History  Problem Relation Age of Onset   Breast cancer Mother 75   Ovarian cancer Mother    Cancer Mother        Pancreatic   Breast cancer Other 26   Hypertension Father    Hyperlipidemia Father    Breast cancer Sister 69   Breast cancer Maternal Aunt 71   Hypertension Maternal Grandmother     Social History:   reports that she quit smoking about 19 years ago. Her smoking use included cigarettes. She has never used smokeless tobacco. She reports that she does not drink alcohol and does not use drugs.  Medications: Medications Prior to Admission  Medication Sig Dispense Refill   ALPRAZolam (XANAX) 0.5 MG tablet Take 1/2 to 1  tablet by mouth twice a day as needed for anxiety 60 tablet 0   cholecalciferol (VITAMIN  D) 1000 UNITS tablet Take 4,000 Units by mouth daily.     estradiol (ESTRACE) 1 MG tablet Take 1 tablet (1 mg total) by mouth daily. 90 tablet 4   fenofibrate 160 MG tablet Take 1 tablet by mouth once a day for cholesterol 90 tablet 4   fluconazole (DIFLUCAN) 150 MG tablet TAKE 1 TABLET BY MOUTH EVERY 2 WEEKS. 6 tablet 3   ibuprofen (ADVIL,MOTRIN) 200 MG tablet Take 3 tablets (600 mg total) by mouth every 8 (eight) hours as needed for mild pain or moderate pain. 30 tablet 0   levothyroxine (SYNTHROID) 25 MCG tablet Take 1 tablet (25 mcg total) by mouth every morning on an empty stomach 90 tablet 2   triamcinolone ointment (KENALOG) 0.5 % Apply 1 application topically 2 (two) times daily. 30 g 0    No results found for this or any previous visit (from the past 48 hour(s)).  No results found.    Blood pressure (!) 140/73, pulse 81, temperature 97.6 F (36.4 C), temperature source Oral, resp. rate 18, height '5\' 5"'$  (1.651 m), weight 84.1 kg, last menstrual period 06/23/2003, SpO2 99 %.  General appearance: alert, cooperative, and appears stated age Head: Normocephalic, without obvious abnormality, atraumatic Neck: supple, symmetrical, trachea midline Extremities: Intact sensation and capillary refill all digits.  +epl/fpl/io.  No wounds.  Pulses: 2+ and symmetric Skin: Skin color, texture, turgor normal. No  rashes or lesions Neurologic: Grossly normal Incision/Wound: none  Assessment/Plan Left ring finger mass.  Non operative and operative treatment options have been discussed with the patient and patient wishes to proceed with operative treatment. Risks, benefits, and alternatives of surgery have been discussed and the patient agrees with the plan of care.   Leanora Cover 03/12/2022, 12:48 PM

## 2022-03-12 NOTE — Op Note (Addendum)
NAME: Nicole Oliver MEDICAL RECORD NO: [id number] DATE OF BIRTH: [birth year] FACILITY: [facility] LOCATION: [facility] PHYSICIAN: Tennis Must, MD   OPERATIVE REPORT   DATE OF PROCEDURE: 03/12/22    PREOPERATIVE DIAGNOSIS: Left ring finger mass   POSTOPERATIVE DIAGNOSIS: Left ring finger mass   PROCEDURE: Excision mass left ring finger subcutaneous, 6 mm   SURGEON:  Leanora Cover, M.D.   ASSISTANT: Daryll Brod, MD   ANESTHESIA:  General   INTRAVENOUS FLUIDS:  Per anesthesia flow sheet.   ESTIMATED BLOOD LOSS:  Minimal.   COMPLICATIONS:  None.   SPECIMENS: Left ring finger mass to pathology   TOURNIQUET TIME:    Total Tourniquet Time Documented: Forearm (Left) - 8 minutes Total: Forearm (Left) - 8 minutes    DISPOSITION:  Stable to PACU.   INDICATIONS: 56 year old female who has noted a mass in her left ring finger.  It is bothersome to her.  She wishes to have it removed.  Risks, benefits and alternatives of surgery were discussed including the risks of blood loss, infection, damage to nerves, vessels, tendons, ligaments, bone for surgery, need for additional surgery, complications with wound healing, continued pain, stiffness, , recurrence.  She voiced understanding of these risks and elected to proceed.  OPERATIVE COURSE:  After being identified preoperatively by myself,  the patient and I agreed on the procedure and site of the procedure.  The surgical site was marked.  Surgical consent had been signed. Preoperative IV antibiotic prophylaxis was given. She was transferred to the operating room and placed on the operating table in supine position with the Left upper extremity on an arm board.  General anesthesia was induced by the anesthesiologist.  Left upper extremity was prepped and draped in normal sterile orthopedic fashion.  A surgical pause was performed between the surgeons, anesthesia, and operating room staff and all were in agreement as to the  patient, procedure, and site of procedure.  Tourniquet at the proximal aspect of the forearm was inflated to 250 mmHg after exsanguination of the arm with an Esmarch bandage.  Incision was made over the mass in the proximal phalanx of the ring finger.  This was carried in subcutaneous tissues by spreading technique.  Mass was easily identified.  Was purplish in coloration.  Bipolar electrocautery is used to obtain hemostasis.  Mass was carefully freed from surrounding tissues and removed.  It measured 6 mm.  It was sent to pathology for examination.  The wound was copiously irrigated with sterile saline.  No remaining mass was noted.  The wound was closed with 4-0 nylon in a horizontal mattress fashion.  It was dressed with sterile Xeroform 4 x 4 and wrapped with a Coban dressing lightly.  Digital block was performed with quarter percent plain Marcaine to aid in postoperative analgesia.  The tourniquet was deflated at 8 minutes.  Fingertips were pink with brisk capillary refill after deflation of tourniquet.  The operative  drapes were broken down.  The patient was awoken from anesthesia safely.  She was transferred back to the stretcher and taken to PACU in stable condition.  I will see her back in the office in 1 week for postoperative followup.  I will give her a prescription for Norco 5/325 1-2 tabs PO q6 hours prn pain, dispense #10.   Leanora Cover, MD Electronically signed, 03/12/22

## 2022-03-12 NOTE — Transfer of Care (Signed)
Immediate Anesthesia Transfer of Care Note  Patient: Nicole Oliver  Procedure(s) Performed: LEFT RING FINGER EXCISION MASS (Left: Hand)  Patient Location: PACU  Anesthesia Type:General  Level of Consciousness: sedated  Airway & Oxygen Therapy: Patient Spontanous Breathing and Patient connected to face mask oxygen  Post-op Assessment: Report given to RN and Post -op Vital signs reviewed and stable  Post vital signs: Reviewed and stable  Last Vitals:  Vitals Value Taken Time  BP    Temp    Pulse 71 03/12/22 1332  Resp 15 03/12/22 1332  SpO2 99 % 03/12/22 1332  Vitals shown include unvalidated device data.  Last Pain:  Vitals:   03/12/22 1030  TempSrc: Oral  PainSc: 0-No pain      Patients Stated Pain Goal: 3 (44/03/47 4259)  Complications: No notable events documented.

## 2022-03-12 NOTE — Op Note (Signed)
I assisted Surgeon(s) and Role:    * Leanora Cover, MD - Primary    Daryll Brod, MD - Assisting on the Procedure(s): LEFT [place] on 03/12/2022.  I provided assistance on this case as follows: set up approach, retraction, identification of the mass and excision, closure and application of the dressing.  Electronically signed by: Daryll Brod, MD Date: 03/12/2022 Time: 1:26 PM

## 2022-03-12 NOTE — Anesthesia Preprocedure Evaluation (Addendum)
Anesthesia Evaluation  Patient identified by MRN, date of birth, ID band Patient awake    Reviewed: Allergy & Precautions, NPO status , Patient's Chart, lab work & pertinent test results  Airway Mallampati: II  TM Distance: >3 FB Neck ROM: Full    Dental no notable dental hx.    Pulmonary former smoker,    Pulmonary exam normal        Cardiovascular negative cardio ROS Normal cardiovascular exam     Neuro/Psych Anxiety negative neurological ROS     GI/Hepatic negative GI ROS, Neg liver ROS,   Endo/Other  Hypothyroidism   Renal/GU negative Renal ROS     Musculoskeletal negative musculoskeletal ROS (+)   Abdominal   Peds  Hematology negative hematology ROS (+)   Anesthesia Other Findings LEFT RING FINGER MASS  Reproductive/Obstetrics                            Anesthesia Physical Anesthesia Plan  ASA: 2  Anesthesia Plan: Bier Block and Bier Block-LIDOCAINE ONLY   Post-op Pain Management:    Induction: Intravenous  PONV Risk Score and Plan: 2 and Ondansetron, Dexamethasone, Propofol infusion, Midazolam and Treatment may vary due to age or medical condition  Airway Management Planned: Simple Face Mask  Additional Equipment:   Intra-op Plan:   Post-operative Plan:   Informed Consent: I have reviewed the patients History and Physical, chart, labs and discussed the procedure including the risks, benefits and alternatives for the proposed anesthesia with the patient or authorized representative who has indicated his/her understanding and acceptance.     Dental advisory given  Plan Discussed with: CRNA  Anesthesia Plan Comments:         Anesthesia Quick Evaluation

## 2022-03-13 ENCOUNTER — Encounter (HOSPITAL_BASED_OUTPATIENT_CLINIC_OR_DEPARTMENT_OTHER): Payer: Self-pay | Admitting: Orthopedic Surgery

## 2022-03-13 LAB — SURGICAL PATHOLOGY

## 2022-03-13 NOTE — Anesthesia Postprocedure Evaluation (Signed)
Anesthesia Post Note  Patient: Nicole Oliver  Procedure(s) Performed: LEFT RING FINGER EXCISION MASS (Left: Hand)     Patient location during evaluation: PACU Anesthesia Type: General Level of consciousness: awake Pain management: pain level controlled Vital Signs Assessment: post-procedure vital signs reviewed and stable Respiratory status: spontaneous breathing, nonlabored ventilation, respiratory function stable and patient connected to nasal cannula oxygen Cardiovascular status: blood pressure returned to baseline and stable Postop Assessment: no apparent nausea or vomiting Anesthetic complications: no   No notable events documented.  Last Vitals:  Vitals:   03/12/22 1345 03/12/22 1401  BP: 114/73 115/68  Pulse: 67 65  Resp: 13 16  Temp:  (!) 36.4 C  SpO2: 98% 98%    Last Pain:  Vitals:   03/12/22 1401  TempSrc:   PainSc: 0-No pain                 Landrum Carbonell P Shavawn Stobaugh

## 2022-03-17 ENCOUNTER — Other Ambulatory Visit (HOSPITAL_COMMUNITY): Payer: Self-pay

## 2022-04-06 DIAGNOSIS — R030 Elevated blood-pressure reading, without diagnosis of hypertension: Secondary | ICD-10-CM | POA: Diagnosis not present

## 2022-04-06 DIAGNOSIS — Z23 Encounter for immunization: Secondary | ICD-10-CM | POA: Diagnosis not present

## 2022-04-06 DIAGNOSIS — Z0000 Encounter for general adult medical examination without abnormal findings: Secondary | ICD-10-CM | POA: Diagnosis not present

## 2022-04-06 DIAGNOSIS — F411 Generalized anxiety disorder: Secondary | ICD-10-CM | POA: Diagnosis not present

## 2022-04-06 DIAGNOSIS — E782 Mixed hyperlipidemia: Secondary | ICD-10-CM | POA: Diagnosis not present

## 2022-04-06 DIAGNOSIS — E039 Hypothyroidism, unspecified: Secondary | ICD-10-CM | POA: Diagnosis not present

## 2022-04-07 ENCOUNTER — Other Ambulatory Visit (HOSPITAL_COMMUNITY): Payer: Self-pay

## 2022-04-07 MED ORDER — LEVOTHYROXINE SODIUM 25 MCG PO TABS
25.0000 ug | ORAL_TABLET | Freq: Every morning | ORAL | 4 refills | Status: DC
  Filled 2022-04-07 – 2022-06-12 (×4): qty 90, 90d supply, fill #0
  Filled 2022-09-07: qty 90, 90d supply, fill #1
  Filled 2022-12-02: qty 90, 90d supply, fill #2
  Filled 2023-03-03: qty 90, 90d supply, fill #3

## 2022-04-07 MED ORDER — FENOFIBRATE 160 MG PO TABS
160.0000 mg | ORAL_TABLET | Freq: Every day | ORAL | 4 refills | Status: DC
  Filled 2022-04-07 – 2022-05-04 (×2): qty 90, 90d supply, fill #0
  Filled 2022-07-30: qty 90, 90d supply, fill #1
  Filled 2022-10-27: qty 90, 90d supply, fill #2
  Filled 2023-01-25: qty 90, 90d supply, fill #3

## 2022-05-04 ENCOUNTER — Other Ambulatory Visit (HOSPITAL_COMMUNITY): Payer: Self-pay

## 2022-05-05 ENCOUNTER — Other Ambulatory Visit (HOSPITAL_COMMUNITY): Payer: Self-pay

## 2022-05-05 MED ORDER — ALPRAZOLAM 0.5 MG PO TABS
0.2500 mg | ORAL_TABLET | Freq: Two times a day (BID) | ORAL | 0 refills | Status: DC | PRN
  Filled 2022-05-05: qty 60, 30d supply, fill #0

## 2022-05-06 ENCOUNTER — Other Ambulatory Visit (HOSPITAL_COMMUNITY): Payer: Self-pay

## 2022-05-12 ENCOUNTER — Other Ambulatory Visit: Payer: Self-pay

## 2022-05-25 ENCOUNTER — Other Ambulatory Visit (HOSPITAL_COMMUNITY): Payer: Self-pay

## 2022-05-25 MED ORDER — TRIAMCINOLONE ACETONIDE 0.1 % EX CREA
TOPICAL_CREAM | CUTANEOUS | 1 refills | Status: DC
  Filled 2022-05-25: qty 30, 15d supply, fill #0
  Filled 2022-06-12: qty 30, 15d supply, fill #1

## 2022-06-12 ENCOUNTER — Other Ambulatory Visit: Payer: Self-pay

## 2022-07-07 ENCOUNTER — Other Ambulatory Visit (HOSPITAL_COMMUNITY): Payer: Self-pay

## 2022-07-07 ENCOUNTER — Other Ambulatory Visit: Payer: Self-pay

## 2022-07-07 MED ORDER — TRIAMCINOLONE ACETONIDE 0.1 % EX CREA
TOPICAL_CREAM | CUTANEOUS | 3 refills | Status: DC
  Filled 2022-07-07: qty 30, 30d supply, fill #0
  Filled 2022-10-27: qty 30, 30d supply, fill #1
  Filled 2022-11-30: qty 30, 30d supply, fill #2
  Filled 2023-01-25: qty 30, 30d supply, fill #3

## 2022-07-08 ENCOUNTER — Other Ambulatory Visit (HOSPITAL_COMMUNITY): Payer: Self-pay

## 2022-07-08 ENCOUNTER — Other Ambulatory Visit: Payer: Self-pay

## 2022-07-08 MED ORDER — ALPRAZOLAM 0.5 MG PO TABS
0.2500 mg | ORAL_TABLET | Freq: Two times a day (BID) | ORAL | 0 refills | Status: DC | PRN
  Filled 2022-07-08: qty 60, 30d supply, fill #0

## 2022-07-30 ENCOUNTER — Other Ambulatory Visit (HOSPITAL_COMMUNITY): Payer: Self-pay

## 2022-08-21 ENCOUNTER — Telehealth: Payer: 59 | Admitting: Nurse Practitioner

## 2022-08-21 DIAGNOSIS — J069 Acute upper respiratory infection, unspecified: Secondary | ICD-10-CM

## 2022-08-21 MED ORDER — BENZONATATE 100 MG PO CAPS: 100.0000 mg | ORAL_CAPSULE | Freq: Three times a day (TID) | ORAL | 0 refills | Status: DC | PRN

## 2022-08-21 MED ORDER — IPRATROPIUM BROMIDE 0.03 % NA SOLN: 2.0000 | Freq: Two times a day (BID) | NASAL | 12 refills | Status: DC

## 2022-08-21 NOTE — Progress Notes (Signed)
E-Visit for Upper Respiratory Infection   We are sorry you are not feeling well.  Here is how we plan to help!  Based on what you have shared with me, it looks like you may have a viral upper respiratory infection.  Upper respiratory infections are caused by a large number of viruses; however, rhinovirus is the most common cause. Your symptoms may have been from the flu, we recommend Tamiflu prescription for flu only in the first 48 hours of symptoms, we do not typically start antibiotic before 7-10 days of symptoms.   We recommend you continue to manage your symptoms with the recommendations below, and schedule a follow up if symptoms persist for longer than one week to be re evaluated for the need for antibiotics at that time.  Providers prescribe antibiotics to treat infections caused by bacteria. Antibiotics are very powerful in treating bacterial infections when they are used properly. To maintain their effectiveness, they should be used only when necessary. Overuse of antibiotics has resulted in the development of superbugs that are resistant to treatment!    After careful review of your answers, I would not recommend an antibiotic for your condition.  Antibiotics are not effective against viruses and therefore should not be used to treat them. Common examples of infections caused by viruses include colds and flu   Symptoms vary from person to person, with common symptoms including sore throat, cough, fatigue or lack of energy and feeling of general discomfort.  A low-grade fever of up to 100.4 may present, but is often uncommon.  Symptoms vary however, and are closely related to a person's age or underlying illnesses.  The most common symptoms associated with an upper respiratory infection are nasal discharge or congestion, cough, sneezing, headache and pressure in the ears and face.  These symptoms usually persist for about 3 to 10 days, but can last up to 2 weeks.  It is important to know that  upper respiratory infections do not cause serious illness or complications in most cases.    Upper respiratory infections can be transmitted from person to person, with the most common method of transmission being a person's hands.  The virus is able to live on the skin and can infect other persons for up to 2 hours after direct contact.  Also, these can be transmitted when someone coughs or sneezes; thus, it is important to cover the mouth to reduce this risk.  To keep the spread of the illness at [place], good hand hygiene is very important.  This is an infection that is most likely caused by a virus. There are no specific treatments other than to help you with the symptoms until the infection runs its course.  We are sorry you are not feeling well.  Here is how we plan to help!   For nasal congestion, you may use an oral decongestants such as Mucinex D or if you have glaucoma or high blood pressure use plain Mucinex.  Saline nasal spray or nasal drops can help and can safely be used as often as needed for congestion.  For your congestion, I have prescribed Ipratropium Bromide nasal spray 0.03% two sprays in each nostril 2-3 times a day  If you do not have a history of heart disease, hypertension, diabetes or thyroid disease, prostate/bladder issues or glaucoma, you may also use Sudafed to treat nasal congestion.  It is highly recommended that you consult with a pharmacist or your primary care physician to ensure this medication is  safe for you to take.     If you have a cough, you may use cough suppressants such as Delsym and Robitussin.  If you have glaucoma or high blood pressure, you can also use Coricidin HBP.   For cough I have prescribed for you A prescription cough medication called Tessalon Perles 100 mg. You may take 1-2 capsules every 8 hours as needed for cough  If you have a sore or scratchy throat, use a saltwater gargle-  to  teaspoon of salt dissolved in a 4-ounce to 8-ounce glass of  warm water.  Gargle the solution for approximately 15-30 seconds and then spit.  It is important not to swallow the solution.  You can also use throat lozenges/cough drops and Chloraseptic spray to help with throat pain or discomfort.  Warm or cold liquids can also be helpful in relieving throat pain.  For headache, pain or general discomfort, you can use Ibuprofen or Tylenol as directed.   Some authorities believe that zinc sprays or the use of Echinacea may shorten the course of your symptoms.   HOME CARE Only take medications as instructed by your medical team. Be sure to drink plenty of fluids. Water is fine as well as fruit juices, sodas and electrolyte beverages. You may want to stay away from caffeine or alcohol. If you are nauseated, try taking small sips of liquids. How do you know if you are getting enough fluid? Your urine should be a pale yellow or almost colorless. Get rest. Taking a steamy shower or using a humidifier may help nasal congestion and ease sore throat pain. You can place a towel over your head and breathe in the steam from hot water coming from a faucet. Using a saline nasal spray works much the same way. Cough drops, hard candies and sore throat lozenges may ease your cough. Avoid close contacts especially the very young and the elderly Cover your mouth if you cough or sneeze Always remember to wash your hands.   GET HELP RIGHT AWAY IF: You develop worsening fever. If your symptoms do not improve within 10 days You develop yellow or green discharge from your nose over 3 days. You have coughing fits You develop a severe head ache or visual changes. You develop shortness of breath, difficulty breathing or start having chest pain Your symptoms persist after you have completed your treatment plan  MAKE SURE YOU  Understand these instructions. Will watch your condition. Will get help right away if you are not doing well or get worse.  Thank you for choosing an  e-visit.  Your e-visit answers were reviewed by a board certified advanced clinical practitioner to complete your personal care plan. Depending upon the condition, your plan could have included both over the counter or prescription medications.  Please review your pharmacy choice. Make sure the pharmacy is open so you can pick up prescription now. If there is a problem, you may contact your provider through [employer] and have the prescription routed to another pharmacy.  Your safety is important to [place]. If you have drug allergies check your prescription carefully.   For the next 24 hours you can use MyChart to ask questions about today's visit, request a non-urgent call back, or ask for a work or school excuse. You will get an email in the next two days asking about your experience. I hope that your e-visit has been valuable and will speed your recovery.  Meds ordered this encounter  Medications  ipratropium (ATROVENT) 0.03 % nasal spray    Sig: Place 2 sprays into both nostrils every 12 (twelve) hours.    Dispense:  30 mL    Refill:  12   benzonatate (TESSALON) 100 MG capsule    Sig: Take 1 capsule (100 mg total) by mouth 3 (three) times daily as needed.    Dispense:  30 capsule    Refill:  0    I spent approximately 5 minutes reviewing the patient's history, current symptoms and coordinating their care today.

## 2022-08-24 ENCOUNTER — Other Ambulatory Visit: Payer: Self-pay

## 2022-09-07 ENCOUNTER — Other Ambulatory Visit (HOSPITAL_COMMUNITY): Payer: Self-pay

## 2022-09-07 ENCOUNTER — Other Ambulatory Visit: Payer: Self-pay

## 2022-09-08 ENCOUNTER — Other Ambulatory Visit: Payer: Self-pay

## 2022-09-08 ENCOUNTER — Other Ambulatory Visit (HOSPITAL_COMMUNITY): Payer: Self-pay

## 2022-09-08 MED ORDER — ALPRAZOLAM 0.5 MG PO TABS
0.2500 mg | ORAL_TABLET | Freq: Two times a day (BID) | ORAL | 0 refills | Status: DC | PRN
  Filled 2022-09-08: qty 60, 30d supply, fill #0

## 2022-09-15 ENCOUNTER — Ambulatory Visit: Payer: 59 | Admitting: Sports Medicine

## 2022-09-15 ENCOUNTER — Other Ambulatory Visit (HOSPITAL_COMMUNITY): Payer: Self-pay

## 2022-09-15 ENCOUNTER — Ambulatory Visit (INDEPENDENT_AMBULATORY_CARE_PROVIDER_SITE_OTHER): Payer: 59 | Admitting: Sports Medicine

## 2022-09-15 VITALS — BP 148/88 | Ht 65.0 in | Wt 165.0 lb

## 2022-09-15 DIAGNOSIS — M25562 Pain in left knee: Secondary | ICD-10-CM | POA: Diagnosis not present

## 2022-09-15 MED ORDER — MELOXICAM 15 MG PO TABS
15.0000 mg | ORAL_TABLET | Freq: Every day | ORAL | 0 refills | Status: DC
  Filled 2022-09-15: qty 40, 40d supply, fill #0

## 2022-09-15 NOTE — Progress Notes (Signed)
   Subjective:    Patient ID: Nicole Oliver, female    DOB: 10-30-1965, 57 y.o.   MRN: [id number]  HPI chief complaint: Left knee pain  Patient is a very pleasant 57 year old female that comes in today complaining of 10 days of left knee pain that began acutely.  She was moving some heavy boxes around with her leg at the time.  Although she does not recall any specific injury while doing this, she noticed significant pain and swelling immediately after getting up from lunch that same day.  This was followed by some bruising later on.  Since then, she has been unable to bear full weight.  She describes instability in the knee.  No catching or popping.  She has been using an Ace wrap and taking over-the-counter Motrin.  She has a surgical history significant for prior ACL reconstruction done in the same knee in 2016.  This was done by Dr. Durward Fortes and she did well postoperatively.  Preoperative MRI at that time showed a paucity of degenerative changes.  Past medical history reviewed Medications reviewed Allergies reviewed  Review of Systems As above    Objective:   Physical Exam  Well-developed, well-nourished.  No acute distress  Left knee: There is approximately a 5 degree extension lag.  Flexion is to 120 degrees.  Trace to 1+ effusion.  She is tender to palpation along medial and lateral joint lines.  Positive lateral McMurray's and positive lateral Thessaly's.  There is some laxity with anterior drawer testing but there appears to be a good solid endpoint with Lachman testing.  PCL is intact.  Neurovascularly intact distally.  Ambulating with a noticeable limp.      Assessment & Plan:   Left knee pain worrisome for lateral meniscal tear-rule out ACL graft tear  MRI of the left knee with subsequent MyChart follow-up to discuss those results and delineate a more definitive treatment plan.  In the meantime, we will try meloxicam 15 mg daily for 5 days then as needed.  She will start  quad sets and isometric straight leg raises to help combat weakness which is already developing in this quad.  We will also try a body helix compression sleeve to help provide additional stability.  She has crutches at home from her previous ACL surgery and I recommended that she use them to help assist with her walking until she is more comfortable and confident in her left knee.  This note was dictated using Dragon naturally speaking software and may contain errors in syntax, spelling, or content which have not been identified prior to signing this note.

## 2022-09-20 ENCOUNTER — Ambulatory Visit
Admission: RE | Admit: 2022-09-20 | Discharge: 2022-09-20 | Disposition: A | Discharge status: Home / Self Care | Payer: 59 | Source: Ambulatory Visit | Attending: Sports Medicine | Admitting: Sports Medicine

## 2022-09-20 DIAGNOSIS — M25562 Pain in left knee: Secondary | ICD-10-CM

## 2022-09-20 DIAGNOSIS — S83242A Other tear of medial meniscus, current injury, left knee, initial encounter: Secondary | ICD-10-CM | POA: Diagnosis not present

## 2022-09-21 ENCOUNTER — Encounter: Payer: Self-pay | Admitting: Pharmacist

## 2022-09-21 ENCOUNTER — Telehealth: Payer: 59 | Admitting: Physician Assistant

## 2022-09-21 ENCOUNTER — Other Ambulatory Visit (HOSPITAL_COMMUNITY): Payer: Self-pay

## 2022-09-21 ENCOUNTER — Other Ambulatory Visit: Payer: Self-pay

## 2022-09-21 DIAGNOSIS — J208 Acute bronchitis due to other specified organisms: Secondary | ICD-10-CM | POA: Diagnosis not present

## 2022-09-21 DIAGNOSIS — B9689 Other specified bacterial agents as the cause of diseases classified elsewhere: Secondary | ICD-10-CM | POA: Diagnosis not present

## 2022-09-21 MED ORDER — PROMETHAZINE-DM 6.25-15 MG/5ML PO SYRP
5.0000 mL | ORAL_SOLUTION | Freq: Four times a day (QID) | ORAL | 0 refills | Status: DC | PRN
  Filled 2022-09-21: qty 118, 6d supply, fill #0

## 2022-09-21 MED ORDER — AZITHROMYCIN 250 MG PO TABS
ORAL_TABLET | ORAL | 0 refills | Status: AC
  Filled 2022-09-21: qty 6, 5d supply, fill #0

## 2022-09-21 NOTE — Progress Notes (Signed)
E-Visit for Cough  We are sorry that you are not feeling well.  Here is how we plan to help!  Based on your presentation I believe you most likely have A cough due to bacteria.  When patients have a fever and a productive cough with a change in color or increased sputum production, we are concerned about bacterial bronchitis.  If left untreated it can progress to pneumonia.  If your symptoms do not improve with your treatment plan it is important that you contact your provider.   I have prescribed Azithromyin 250 mg: two tablets now and then one tablet daily for 4 additonal days    In addition you may use Promethazine DM cough syrup Take 5mL every 6 hours as needed for cough  From your responses in the eVisit questionnaire you describe inflammation in the upper respiratory tract which is causing a significant cough.  This is commonly called Bronchitis and has four common causes:   Allergies Viral Infections Acid Reflux Bacterial Infection Allergies, viruses and acid reflux are treated by controlling symptoms or eliminating the cause. An example might be a cough caused by taking certain blood pressure medications. You stop the cough by changing the medication. Another example might be a cough caused by acid reflux. Controlling the reflux helps control the cough.  USE OF BRONCHODILATOR ("RESCUE") INHALERS: There is a risk from using your bronchodilator too frequently.  The risk is that over-reliance on a medication which only relaxes the muscles surrounding the breathing tubes can reduce the effectiveness of medications prescribed to reduce swelling and congestion of the tubes themselves.  Although you feel brief relief from the bronchodilator inhaler, your asthma may actually be worsening with the tubes becoming more swollen and filled with mucus.  This can delay other crucial treatments, such as oral steroid medications. If you need to use a bronchodilator inhaler daily, several times per day, you  should discuss this with your provider.  There are probably better treatments that could be used to keep your asthma under control.     HOME CARE Only take medications as instructed by your medical team. Complete the entire course of an antibiotic. Drink plenty of fluids and get plenty of rest. Avoid close contacts especially the very young and the elderly Cover your mouth if you cough or cough into your sleeve. Always remember to wash your hands A steam or ultrasonic humidifier can help congestion.   GET HELP RIGHT AWAY IF: You develop worsening fever. You become short of breath You cough up blood. Your symptoms persist after you have completed your treatment plan MAKE SURE YOU  Understand these instructions. Will watch your condition. Will get help right away if you are not doing well or get worse.    Thank you for choosing an e-visit.  Your e-visit answers were reviewed by a board certified advanced clinical practitioner to complete your personal care plan. Depending upon the condition, your plan could have included both over the counter or prescription medications.  Please review your pharmacy choice. Make sure the pharmacy is open so you can pick up prescription now. If there is a problem, you may contact your provider through MyChart messaging and have the prescription routed to another pharmacy.  Your safety is important to us. If you have drug allergies check your prescription carefully.   For the next 24 hours you can use MyChart to ask questions about today's visit, request a non-urgent call back, or ask for a work or school   excuse. You will get an email in the next two days asking about your experience. I hope that your e-visit has been valuable and will speed your recovery.  I have spent 5 minutes in review of e-visit questionnaire, review and updating patient chart, medical decision making and response to patient.   Vina Byrd M Amadou Katzenstein, PA-C  

## 2022-09-28 ENCOUNTER — Encounter: Payer: Self-pay | Admitting: Sports Medicine

## 2022-10-21 ENCOUNTER — Ambulatory Visit
Admission: RE | Admit: 2022-10-21 | Discharge: 2022-10-21 | Disposition: A | Discharge status: Home / Self Care | Payer: 59 | Source: Ambulatory Visit | Attending: Family Medicine | Admitting: Family Medicine

## 2022-10-21 ENCOUNTER — Other Ambulatory Visit: Payer: Self-pay | Admitting: Family Medicine

## 2022-10-21 DIAGNOSIS — R030 Elevated blood-pressure reading, without diagnosis of hypertension: Secondary | ICD-10-CM | POA: Diagnosis not present

## 2022-10-21 DIAGNOSIS — R059 Cough, unspecified: Secondary | ICD-10-CM | POA: Diagnosis not present

## 2022-10-21 DIAGNOSIS — R040 Epistaxis: Secondary | ICD-10-CM | POA: Diagnosis not present

## 2022-10-27 ENCOUNTER — Other Ambulatory Visit (HOSPITAL_COMMUNITY): Payer: Self-pay

## 2022-11-09 ENCOUNTER — Other Ambulatory Visit (HOSPITAL_COMMUNITY): Payer: Self-pay

## 2022-11-09 ENCOUNTER — Other Ambulatory Visit: Payer: Self-pay | Admitting: Obstetrics & Gynecology

## 2022-11-09 MED ORDER — ALPRAZOLAM 0.5 MG PO TABS
0.2500 mg | ORAL_TABLET | Freq: Two times a day (BID) | ORAL | 0 refills | Status: AC | PRN
  Filled 2022-11-09: qty 60, 30d supply, fill #0

## 2022-11-10 ENCOUNTER — Other Ambulatory Visit: Payer: Self-pay

## 2022-11-20 ENCOUNTER — Other Ambulatory Visit (HOSPITAL_COMMUNITY): Payer: Self-pay

## 2022-11-30 ENCOUNTER — Other Ambulatory Visit: Payer: Self-pay

## 2022-12-02 ENCOUNTER — Other Ambulatory Visit (HOSPITAL_COMMUNITY): Payer: Self-pay

## 2023-01-04 ENCOUNTER — Ambulatory Visit: Payer: 59 | Admitting: Obstetrics & Gynecology

## 2023-01-04 ENCOUNTER — Encounter: Payer: Self-pay | Admitting: Obstetrics & Gynecology

## 2023-01-04 ENCOUNTER — Other Ambulatory Visit (HOSPITAL_COMMUNITY): Payer: Self-pay

## 2023-01-04 VITALS — BP 124/84 | HR 98 | Resp 16 | Ht 64.25 in | Wt 189.0 lb

## 2023-01-04 DIAGNOSIS — Z90710 Acquired absence of both cervix and uterus: Secondary | ICD-10-CM

## 2023-01-04 DIAGNOSIS — Z79890 Hormone replacement therapy (postmenopausal): Secondary | ICD-10-CM | POA: Diagnosis not present

## 2023-01-04 DIAGNOSIS — Z01419 Encounter for gynecological examination (general) (routine) without abnormal findings: Secondary | ICD-10-CM

## 2023-01-04 DIAGNOSIS — Z90722 Acquired absence of ovaries, bilateral: Secondary | ICD-10-CM | POA: Diagnosis not present

## 2023-01-04 MED ORDER — ESTRADIOL 1 MG PO TABS
1.0000 mg | ORAL_TABLET | Freq: Every day | ORAL | 4 refills | Status: DC
  Filled 2023-01-04 – 2023-01-29 (×4): qty 90, 90d supply, fill #0
  Filled 2023-05-11: qty 90, 90d supply, fill #1
  Filled 2023-08-05: qty 90, 90d supply, fill #2
  Filled 2023-11-01: qty 90, 90d supply, fill #3

## 2023-01-04 NOTE — Progress Notes (Signed)
Nicole Oliver 1966-04-27 [id number]   History:    57 y.o. G0   RP:  Established patient presenting for annual gyn exam    HPI:  Status post TAH BSO 2005 for leiomyoma and endometriosis.  Postmenopausal on HRT with Estradiol 1 mg PO daily.  Pap smear 12/2021 was negative.  Pap reflex today. Urine normal. BMs normal. Colono 2022.  Breasts normal.  Mammo 01/2022 Neg.  BMI 32.19.  Exercising.  Health Labs with Fam MD.  Dx of Hypothyroidism on Levothyroxine.   Past medical history,surgical history, family history and social history were all reviewed and documented in the EPIC chart.  Gynecologic History Patient's last menstrual period was 06/23/2003.  Obstetric History OB History  Gravida Para Term Preterm AB Living  0            SAB IAB Ectopic Multiple Live Births                ROS: A ROS was performed and pertinent positives and negatives are included in the history. GENERAL: No fevers or chills. HEENT: No change in vision, no earache, sore throat or sinus congestion. NECK: No pain or stiffness. CARDIOVASCULAR: No chest pain or pressure. No palpitations. PULMONARY: No shortness of breath, cough or wheeze. GASTROINTESTINAL: No abdominal pain, nausea, vomiting or diarrhea, melena or bright red blood per rectum. GENITOURINARY: No urinary frequency, urgency, hesitancy or dysuria. MUSCULOSKELETAL: No joint or muscle pain, no back pain, no recent trauma. DERMATOLOGIC: No rash, no itching, no lesions. ENDOCRINE: No polyuria, polydipsia, no heat or cold intolerance. No recent change in weight. HEMATOLOGICAL: No anemia or easy bruising or bleeding. NEUROLOGIC: No headache, seizures, numbness, tingling or weakness. PSYCHIATRIC: No depression, no loss of interest in normal activity or change in sleep pattern.     Exam:   BP 124/84   Pulse 98   Resp 16   Ht 5' 4.25" (1.632 m)   Wt 189 lb (85.7 kg)   LMP 06/23/2003 Comment: sexually active  SpO2 96%   BMI 32.19 kg/m   Body mass index is  32.19 kg/m.  General appearance : Well developed well nourished female. No acute distress HEENT: Eyes: no retinal hemorrhage or exudates,  Neck supple, trachea midline, no carotid bruits, no thyroidmegaly Lungs: Clear to auscultation, no rhonchi or wheezes, or rib retractions  Heart: Regular rate and rhythm, no murmurs or gallops Breast:Examined in sitting and supine position were symmetrical in appearance, no palpable masses or tenderness,  no skin retraction, no nipple inversion, no nipple discharge, no skin discoloration, no axillary or supraclavicular lymphadenopathy Abdomen: no palpable masses or tenderness, no rebound or guarding Extremities: no edema or skin discoloration or tenderness  Pelvic: Vulva: Normal             Vagina: No gross lesions or discharge  Cervix/Uterus absent  Adnexa  Without masses or tenderness  Anus: Normal   Assessment/Plan:  57 y.o. female for annual exam   1. Well female exam with routine gynecological exam Status post TAH BSO 2005 for leiomyoma and endometriosis.  Postmenopausal on HRT with Estradiol 1 mg PO daily.  Pap smear 12/2021 was negative.  Pap reflex today. Urine normal. BMs normal. Colono 2022.  Breasts normal.  Mammo 01/2022 Neg.  BMI 32.19.  Exercising.  Health Labs with Fam MD.  Dx of Hypothyroidism on Levothyroxine.  2. S/P TAH-BSO  3. Postmenopausal hormone replacement therapy Status post TAH BSO 2005 for leiomyoma and endometriosis.  Postmenopausal on HRT with Estradiol  1 mg PO daily.  No CI to continue.  Prescription sent to pharmacy.  Will attempt to progressively wean to 0.5 mg PO daily in the course of the year.  Other orders - fluconazole (DIFLUCAN) 150 MG tablet; Take 150 mg by mouth. Takes 1 po q 14 days for maintenance - VITAMIN D PO; Take 5,000 Int'l Units by mouth. - estradiol (ESTRACE) 1 MG tablet; Take 1 tablet (1 mg total) by mouth daily.   Genia Del MD, 1:46 PM

## 2023-01-05 ENCOUNTER — Ambulatory Visit: Payer: 59 | Admitting: Obstetrics & Gynecology

## 2023-01-25 ENCOUNTER — Other Ambulatory Visit (HOSPITAL_COMMUNITY): Payer: Self-pay

## 2023-01-25 ENCOUNTER — Other Ambulatory Visit: Payer: Self-pay

## 2023-01-25 MED ORDER — ALPRAZOLAM 0.5 MG PO TABS
0.2500 mg | ORAL_TABLET | Freq: Two times a day (BID) | ORAL | 0 refills | Status: DC | PRN
  Filled 2023-01-25: qty 60, 30d supply, fill #0

## 2023-01-26 ENCOUNTER — Other Ambulatory Visit: Payer: Self-pay

## 2023-01-27 ENCOUNTER — Other Ambulatory Visit (HOSPITAL_COMMUNITY): Payer: Self-pay

## 2023-01-27 ENCOUNTER — Other Ambulatory Visit: Payer: Self-pay | Admitting: Nurse Practitioner

## 2023-01-27 ENCOUNTER — Telehealth: Payer: Self-pay

## 2023-01-27 DIAGNOSIS — N760 Acute vaginitis: Secondary | ICD-10-CM

## 2023-01-27 MED ORDER — FLUCONAZOLE 150 MG PO TABS
150.0000 mg | ORAL_TABLET | ORAL | 1 refills | Status: DC
  Filled 2023-01-27: qty 6, 18d supply, fill #0
  Filled 2023-02-16: qty 6, 18d supply, fill #1

## 2023-01-27 NOTE — Telephone Encounter (Addendum)
Patient aware she has rx for estradiol for 80yr sent on 7/24. Per [facility] pharmacy, she has to wait until August 9th to pick it up. Rx was transferred to [facility] outpatient pharmacy per patient request.  Patient is also requesting a rx for diflucan that Dr Seymour Bars was writing for her as a maintenance med. Please approve if appropriate Diflucan 150mg  AEX: 01-04-23 NEXT AEX: 01-06-24

## 2023-01-27 NOTE — Telephone Encounter (Signed)
Refills provided on Diflucan. Sent to [facility] outpatient pharmacy as well.

## 2023-01-27 NOTE — Telephone Encounter (Signed)
Former ML pt LVM in triage line stating that she needed refills on her estradiol and fluconazole (maintenance per pt) sent in to pharmacy. Stated at the time of AEX, refills were not needed since had some on hand so ML advised her to call office when needed.  Rx sent for estradiol on 01/04/2023 for 1 year script. However, fluconazole was not.

## 2023-01-28 ENCOUNTER — Other Ambulatory Visit: Payer: Self-pay

## 2023-01-28 ENCOUNTER — Other Ambulatory Visit (HOSPITAL_COMMUNITY): Payer: Self-pay

## 2023-01-28 NOTE — Telephone Encounter (Signed)
Patient notified

## 2023-01-29 ENCOUNTER — Other Ambulatory Visit (HOSPITAL_COMMUNITY): Payer: Self-pay

## 2023-02-08 ENCOUNTER — Other Ambulatory Visit: Payer: Self-pay | Admitting: Nurse Practitioner

## 2023-02-08 DIAGNOSIS — Z0000 Encounter for general adult medical examination without abnormal findings: Secondary | ICD-10-CM

## 2023-02-16 ENCOUNTER — Other Ambulatory Visit (HOSPITAL_COMMUNITY): Payer: Self-pay

## 2023-02-25 ENCOUNTER — Ambulatory Visit: Payer: 59

## 2023-03-03 ENCOUNTER — Other Ambulatory Visit (HOSPITAL_COMMUNITY): Payer: Self-pay

## 2023-03-09 ENCOUNTER — Ambulatory Visit
Admission: RE | Admit: 2023-03-09 | Discharge: 2023-03-09 | Disposition: A | Discharge status: Home / Self Care | Payer: 59 | Source: Ambulatory Visit | Attending: Nurse Practitioner

## 2023-03-09 DIAGNOSIS — Z0000 Encounter for general adult medical examination without abnormal findings: Secondary | ICD-10-CM

## 2023-03-09 DIAGNOSIS — Z1231 Encounter for screening mammogram for malignant neoplasm of breast: Secondary | ICD-10-CM | POA: Diagnosis not present

## 2023-03-24 DIAGNOSIS — H52203 Unspecified astigmatism, bilateral: Secondary | ICD-10-CM | POA: Diagnosis not present

## 2023-04-03 ENCOUNTER — Other Ambulatory Visit (HOSPITAL_COMMUNITY): Payer: Self-pay

## 2023-04-05 ENCOUNTER — Other Ambulatory Visit (HOSPITAL_COMMUNITY): Payer: Self-pay

## 2023-04-05 MED ORDER — ALPRAZOLAM 0.5 MG PO TABS
0.2500 mg | ORAL_TABLET | Freq: Two times a day (BID) | ORAL | 0 refills | Status: DC | PRN
  Filled 2023-04-05: qty 60, 30d supply, fill #0

## 2023-04-06 ENCOUNTER — Other Ambulatory Visit (HOSPITAL_COMMUNITY): Payer: Self-pay

## 2023-04-08 DIAGNOSIS — E559 Vitamin D deficiency, unspecified: Secondary | ICD-10-CM | POA: Diagnosis not present

## 2023-04-08 DIAGNOSIS — F411 Generalized anxiety disorder: Secondary | ICD-10-CM | POA: Diagnosis not present

## 2023-04-08 DIAGNOSIS — R635 Abnormal weight gain: Secondary | ICD-10-CM | POA: Diagnosis not present

## 2023-04-08 DIAGNOSIS — Z1159 Encounter for screening for other viral diseases: Secondary | ICD-10-CM | POA: Diagnosis not present

## 2023-04-08 DIAGNOSIS — Z0189 Encounter for other specified special examinations: Secondary | ICD-10-CM | POA: Diagnosis not present

## 2023-04-08 DIAGNOSIS — M2550 Pain in unspecified joint: Secondary | ICD-10-CM | POA: Diagnosis not present

## 2023-04-08 DIAGNOSIS — Z23 Encounter for immunization: Secondary | ICD-10-CM | POA: Diagnosis not present

## 2023-04-08 DIAGNOSIS — R609 Edema, unspecified: Secondary | ICD-10-CM | POA: Diagnosis not present

## 2023-04-08 DIAGNOSIS — E039 Hypothyroidism, unspecified: Secondary | ICD-10-CM | POA: Diagnosis not present

## 2023-04-08 DIAGNOSIS — Z130 Encounter for screening for diseases of the blood and blood-forming organs and certain disorders involving the immune mechanism: Secondary | ICD-10-CM | POA: Diagnosis not present

## 2023-04-08 DIAGNOSIS — E782 Mixed hyperlipidemia: Secondary | ICD-10-CM | POA: Diagnosis not present

## 2023-04-21 ENCOUNTER — Other Ambulatory Visit (HOSPITAL_COMMUNITY): Payer: Self-pay

## 2023-04-22 ENCOUNTER — Other Ambulatory Visit (HOSPITAL_COMMUNITY): Payer: Self-pay

## 2023-04-22 ENCOUNTER — Other Ambulatory Visit: Payer: Self-pay

## 2023-04-22 MED ORDER — FENOFIBRATE 160 MG PO TABS
160.0000 mg | ORAL_TABLET | Freq: Every day | ORAL | 3 refills | Status: DC
  Filled 2023-04-22: qty 90, 90d supply, fill #0
  Filled 2023-07-20: qty 90, 90d supply, fill #1
  Filled 2023-10-15: qty 90, 90d supply, fill #2
  Filled 2024-01-17: qty 90, 90d supply, fill #3

## 2023-05-06 ENCOUNTER — Other Ambulatory Visit (HOSPITAL_COMMUNITY): Payer: Self-pay

## 2023-05-06 ENCOUNTER — Other Ambulatory Visit: Payer: Self-pay

## 2023-05-06 MED ORDER — OLMESARTAN MEDOXOMIL 20 MG PO TABS
20.0000 mg | ORAL_TABLET | Freq: Every day | ORAL | 1 refills | Status: DC
  Filled 2023-05-06: qty 30, 30d supply, fill #0
  Filled 2023-06-02: qty 30, 30d supply, fill #1

## 2023-05-08 ENCOUNTER — Other Ambulatory Visit (HOSPITAL_COMMUNITY): Payer: Self-pay

## 2023-05-11 ENCOUNTER — Other Ambulatory Visit (HOSPITAL_COMMUNITY): Payer: Self-pay

## 2023-05-11 ENCOUNTER — Other Ambulatory Visit: Payer: Self-pay

## 2023-05-18 ENCOUNTER — Other Ambulatory Visit: Payer: Self-pay | Admitting: Nurse Practitioner

## 2023-05-18 ENCOUNTER — Other Ambulatory Visit: Payer: Self-pay

## 2023-05-18 DIAGNOSIS — N760 Acute vaginitis: Secondary | ICD-10-CM

## 2023-05-18 MED ORDER — FLUCONAZOLE 150 MG PO TABS
ORAL_TABLET | ORAL | 1 refills | Status: DC
  Filled 2023-05-18: qty 6, 84d supply, fill #0
  Filled 2023-08-05: qty 6, 84d supply, fill #1

## 2023-05-18 NOTE — Telephone Encounter (Signed)
Med refill request: fluconazole (diflucan) 150 mg Last AEX: 12/30/21 Next AEX: 01/06/24 Last MMG (if hormonal med) Refill authorized: Last Rx sent #6 with 1 refill on 01/27/23. Please approve or deny as appropriate.

## 2023-05-25 ENCOUNTER — Other Ambulatory Visit (HOSPITAL_COMMUNITY): Payer: Self-pay

## 2023-05-26 ENCOUNTER — Other Ambulatory Visit: Payer: Self-pay

## 2023-05-26 ENCOUNTER — Other Ambulatory Visit (HOSPITAL_COMMUNITY): Payer: Self-pay

## 2023-05-26 MED ORDER — LEVOTHYROXINE SODIUM 25 MCG PO TABS
25.0000 ug | ORAL_TABLET | Freq: Every morning | ORAL | 3 refills | Status: DC
  Filled 2023-05-26: qty 90, 90d supply, fill #0
  Filled 2023-08-23: qty 90, 90d supply, fill #1
  Filled 2023-11-18: qty 90, 90d supply, fill #2
  Filled 2024-02-15: qty 90, 90d supply, fill #3

## 2023-05-26 MED ORDER — TRIAMCINOLONE ACETONIDE 0.1 % EX CREA
TOPICAL_CREAM | CUTANEOUS | 3 refills | Status: AC
  Filled 2023-05-26: qty 30, 30d supply, fill #0
  Filled 2023-07-20: qty 30, 30d supply, fill #1
  Filled 2024-01-17: qty 30, 30d supply, fill #2

## 2023-05-26 MED ORDER — ALPRAZOLAM 0.5 MG PO TABS
0.2500 mg | ORAL_TABLET | Freq: Two times a day (BID) | ORAL | 0 refills | Status: DC | PRN
  Filled 2023-05-26: qty 60, 30d supply, fill #0

## 2023-06-02 ENCOUNTER — Other Ambulatory Visit (HOSPITAL_COMMUNITY): Payer: Self-pay

## 2023-06-02 ENCOUNTER — Other Ambulatory Visit: Payer: Self-pay

## 2023-06-10 ENCOUNTER — Other Ambulatory Visit (INDEPENDENT_AMBULATORY_CARE_PROVIDER_SITE_OTHER): Payer: 59

## 2023-06-10 ENCOUNTER — Other Ambulatory Visit (HOSPITAL_COMMUNITY): Payer: Self-pay

## 2023-06-10 ENCOUNTER — Other Ambulatory Visit: Payer: Self-pay

## 2023-06-10 ENCOUNTER — Encounter: Payer: Self-pay | Admitting: Physician Assistant

## 2023-06-10 ENCOUNTER — Ambulatory Visit (INDEPENDENT_AMBULATORY_CARE_PROVIDER_SITE_OTHER): Payer: 59 | Admitting: Physician Assistant

## 2023-06-10 DIAGNOSIS — M25552 Pain in left hip: Secondary | ICD-10-CM | POA: Insufficient documentation

## 2023-06-10 DIAGNOSIS — M545 Low back pain, unspecified: Secondary | ICD-10-CM | POA: Insufficient documentation

## 2023-06-10 DIAGNOSIS — M5442 Lumbago with sciatica, left side: Secondary | ICD-10-CM

## 2023-06-10 MED ORDER — METHYLPREDNISOLONE 4 MG PO TBPK
ORAL_TABLET | ORAL | 0 refills | Status: DC
  Filled 2023-06-10: qty 21, 6d supply, fill #0

## 2023-06-10 NOTE — Progress Notes (Signed)
Office Visit Note   Patient: Nicole Oliver           Date of Birth: July 06, 1965           MRN: [id number] Visit Date: 06/10/2023              Requested by: No referring provider defined for this encounter. PCP: Blair Heys, MD (Inactive)   Assessment & Plan: Visit Diagnoses:  1. Acute low back pain with left-sided sciatica, unspecified back pain laterality   2. Pain of left hip   3. Pain in left hip     Plan: Pleasant 57 year old woman with a 43-month history of low back pain radiating into her left hip.  Denies any particular injury.  She has been treating it with some Motrin with limited success.  She denies any fever or chills.  She denies any hematuria.  She denies any loss of bowel or bladder control she does have some radicular findings that go down into her foot.  But her strength is completely intact.  We discussed trying a steroid taper pack she will discontinue the Motrin for now.  Will also place an order for physical therapy.  Will follow-up in 6 weeks  Follow-Up Instructions: No follow-ups on file.   Orders:  Orders Placed This Encounter  Procedures   XR HIP UNILAT W OR W/O PELVIS 2-3 VIEWS LEFT   XR Lumbar Spine 2-3 Views   No orders of the defined types were placed in this encounter.     Procedures: No procedures performed   Clinical Data: No additional findings.   Subjective: Chief Complaint  Patient presents with   Left Hip - Pain    HPI patient is a 57 year old woman with a chief complaint of left hip pain.  She said couple months she was having low back pain had to use a walker pain in the low back radiates into the left hip she describes it as achy.  Does have some numbness in her toes.  Hurts to walk sit and stand cannot walk and sit too long she has been taking Motrin  Review of Systems  All other systems reviewed and are negative.    Objective: Vital Signs:   Physical Exam Constitutional:      Appearance: Normal appearance.   Pulmonary:     Effort: Pulmonary effort is normal.  Skin:    General: Skin is warm and dry.  Neurological:     General: No focal deficit present.     Mental Status: She is alert.  Psychiatric:        Mood and Affect: Mood normal.        Behavior: Behavior normal.     Ortho Exam Examination note no tenderness to palpation throughout her spine she does have a little muscle tenderness in the paravertebral muscles especially on the left-hand side she is neurovascularly intact compartments are soft and nontender she has good strength with resisted dorsiflexion plantarflexion extension and flexion of her knees.  No tenderness with manipulation of her hip Specialty Comments:  No specialty comments available.  Imaging: No results found.   PMFS History: Patient Active Problem List   Diagnosis Date Noted   Low back pain 06/10/2023   Pain in left hip 06/10/2023   ACL (anterior cruciate ligament) tear 01/10/2015   Finger pain 04/13/2012   Past Medical History:  Diagnosis Date   ACL tear 12/21/2014   left   Anxiety    Dental crown present  Hyperlipidemia     Family History  Problem Relation Age of Onset   Breast cancer Mother 15   Ovarian cancer Mother    Cancer Mother        Pancreatic   Breast cancer Other 26   Hypertension Father    Hyperlipidemia Father    Breast cancer Sister 65   Breast cancer Maternal Aunt 67   Hypertension Maternal Grandmother     Past Surgical History:  Procedure Laterality Date   ABDOMINAL HYSTERECTOMY     TAH BSO   EXCISION METACARPAL MASS Left 03/12/2022   Procedure: LEFT RING FINGER EXCISION MASS;  Surgeon: Betha Loa, MD;  Location: [facility];  Service: Orthopedics;  Laterality: Left;  45 MIN   KNEE ARTHROSCOPY WITH ANTERIOR CRUCIATE LIGAMENT (ACL) REPAIR Left 01/10/2015   Procedure: KNEE ARTHROSCOPY WITH ANTERIOR CRUCIATE LIGAMENT (ACL) RECONSTRUCTION WITH BONE-TENDON-BONE (PATELLAR TENDON) ALLOGRAFT.    ARTHROSCOPIC ACL  RECONSTRUCTION WITH BONE-TENDON-BONE (PATELLAR TENDON) ALLOGRAFT, 5\' 5" , 170#.;  Surgeon: Valeria Batman, MD;  Location: [facility];  Service: Orthopedics;  Laterality: Left;  ARTHROSCOPIC ACL RECONSTRUCTION WITH BONE-TENDON-BONE (   NASAL RECONSTRUCTION WITH SEPTAL REPAIR     STRABISMUS SURGERY     TOTAL ABDOMINAL HYSTERECTOMY W/ BILATERAL SALPINGOOPHORECTOMY  06/25/2003   Social History   Occupational History   Not on file  Tobacco Use   Smoking status: Former    Current packs/day: 0.00    Types: Cigarettes    Quit date: 06/21/2002    Years since quitting: 20.9   Smokeless tobacco: Never  Vaping Use   Vaping status: Never Used  Substance and Sexual Activity   Alcohol use: No    Alcohol/week: 0.0 standard drinks of alcohol   Drug use: No   Sexual activity: Yes    Birth control/protection: Surgical    Comment: HYST-1st intercourse 57 yo-Fewer than 5 partners

## 2023-06-21 DIAGNOSIS — I10 Essential (primary) hypertension: Secondary | ICD-10-CM | POA: Diagnosis not present

## 2023-07-02 ENCOUNTER — Other Ambulatory Visit (HOSPITAL_COMMUNITY): Payer: Self-pay

## 2023-07-02 MED ORDER — OLMESARTAN MEDOXOMIL 20 MG PO TABS
20.0000 mg | ORAL_TABLET | Freq: Every day | ORAL | 3 refills | Status: DC
  Filled 2023-07-02: qty 90, 90d supply, fill #0
  Filled 2023-09-30: qty 90, 90d supply, fill #1
  Filled 2023-12-27: qty 90, 90d supply, fill #2
  Filled 2024-03-20: qty 90, 90d supply, fill #3

## 2023-07-16 ENCOUNTER — Other Ambulatory Visit (HOSPITAL_COMMUNITY): Payer: Self-pay

## 2023-07-20 ENCOUNTER — Other Ambulatory Visit: Payer: Self-pay

## 2023-07-20 ENCOUNTER — Other Ambulatory Visit (HOSPITAL_COMMUNITY): Payer: Self-pay

## 2023-07-21 ENCOUNTER — Other Ambulatory Visit (HOSPITAL_COMMUNITY): Payer: Self-pay

## 2023-07-21 MED ORDER — ALPRAZOLAM 0.5 MG PO TABS
0.2500 mg | ORAL_TABLET | Freq: Two times a day (BID) | ORAL | 0 refills | Status: DC | PRN
  Filled 2023-07-21: qty 60, 30d supply, fill #0

## 2023-07-22 ENCOUNTER — Ambulatory Visit: Payer: 59 | Admitting: Physician Assistant

## 2023-07-30 ENCOUNTER — Telehealth: Payer: 59 | Admitting: Emergency Medicine

## 2023-07-30 DIAGNOSIS — J329 Chronic sinusitis, unspecified: Secondary | ICD-10-CM

## 2023-07-30 DIAGNOSIS — J029 Acute pharyngitis, unspecified: Secondary | ICD-10-CM | POA: Diagnosis not present

## 2023-07-30 DIAGNOSIS — R051 Acute cough: Secondary | ICD-10-CM | POA: Diagnosis not present

## 2023-07-30 MED ORDER — BENZONATATE 100 MG PO CAPS: 100.0000 mg | ORAL_CAPSULE | Freq: Two times a day (BID) | ORAL | 0 refills | Status: DC | PRN

## 2023-07-30 MED ORDER — AMOXICILLIN-POT CLAVULANATE 875-125 MG PO TABS: 1.0000 | ORAL_TABLET | Freq: Two times a day (BID) | ORAL | 0 refills | Status: DC

## 2023-07-30 NOTE — Progress Notes (Signed)
E-Visit for Sinus Problems  We are sorry that you are not feeling well.  Here is how we plan to help!  Based on what you have shared with me it looks like you have sinusitis.  Sinusitis is inflammation and infection in the sinus cavities of the head.  Based on your presentation I believe you most likely have Acute Bacterial Sinusitis.  This is an infection caused by bacteria and is treated with antibiotics. I have prescribed Augmentin 875mg/125mg one tablet twice daily with food, for 7 days. You may use an oral decongestant such as Mucinex D or if you have glaucoma or high blood pressure use plain Mucinex. Saline nasal spray help and can safely be used as often as needed for congestion.  If you develop worsening sinus pain, fever or notice severe headache and vision changes, or if symptoms are not better after completion of antibiotic, please schedule an appointment with a health care provider.    Sinus infections are not as easily transmitted as other respiratory infection, however we still recommend that you avoid close contact with loved ones, especially the very young and elderly.  Remember to wash your hands thoroughly throughout the day as this is the number one way to prevent the spread of infection!  Home Care: Only take medications as instructed by your medical team. Complete the entire course of an antibiotic. Do not take these medications with alcohol. A steam or ultrasonic humidifier can help congestion.  You can place a towel over your head and breathe in the steam from hot water coming from a faucet. Avoid close contacts especially the very young and the elderly. Cover your mouth when you cough or sneeze. Always remember to wash your hands.  Get Help Right Away If: You develop worsening fever or sinus pain. You develop a severe head ache or visual changes. Your symptoms persist after you have completed your treatment plan.  Make sure you Understand these instructions. Will watch  your condition. Will get help right away if you are not doing well or get worse.  Thank you for choosing an e-visit.  Your e-visit answers were reviewed by a board certified advanced clinical practitioner to complete your personal care plan. Depending upon the condition, your plan could have included both over the counter or prescription medications.  Please review your pharmacy choice. Make sure the pharmacy is open so you can pick up prescription now. If there is a problem, you may contact your provider through MyChart messaging and have the prescription routed to another pharmacy.  Your safety is important to us. If you have drug allergies check your prescription carefully.   For the next 24 hours you can use MyChart to ask questions about today's visit, request a non-urgent call back, or ask for a work or school excuse. You will get an email in the next two days asking about your experience. I hope that your e-visit has been valuable and will speed your recovery.  Approximately 5 minutes was used in reviewing the patient's chart, questionnaire, prescribing medications, and documentation.  

## 2023-08-05 ENCOUNTER — Other Ambulatory Visit (HOSPITAL_COMMUNITY): Payer: Self-pay

## 2023-08-14 ENCOUNTER — Other Ambulatory Visit (HOSPITAL_COMMUNITY): Payer: Self-pay

## 2023-08-23 ENCOUNTER — Other Ambulatory Visit (HOSPITAL_COMMUNITY): Payer: Self-pay

## 2023-09-17 ENCOUNTER — Other Ambulatory Visit (HOSPITAL_COMMUNITY): Payer: Self-pay

## 2023-09-17 MED ORDER — ALPRAZOLAM 0.5 MG PO TABS
0.2500 mg | ORAL_TABLET | Freq: Two times a day (BID) | ORAL | 0 refills | Status: DC | PRN
  Filled 2023-09-17: qty 60, 30d supply, fill #0

## 2023-09-20 ENCOUNTER — Other Ambulatory Visit: Payer: Self-pay

## 2023-09-30 ENCOUNTER — Other Ambulatory Visit: Payer: Self-pay

## 2023-10-15 ENCOUNTER — Other Ambulatory Visit (HOSPITAL_COMMUNITY): Payer: Self-pay

## 2023-11-01 ENCOUNTER — Other Ambulatory Visit: Payer: Self-pay

## 2023-11-01 ENCOUNTER — Other Ambulatory Visit (HOSPITAL_COMMUNITY): Payer: Self-pay

## 2023-11-01 ENCOUNTER — Other Ambulatory Visit: Payer: Self-pay | Admitting: Nurse Practitioner

## 2023-11-01 DIAGNOSIS — N760 Acute vaginitis: Secondary | ICD-10-CM

## 2023-11-02 ENCOUNTER — Other Ambulatory Visit (HOSPITAL_COMMUNITY): Payer: Self-pay

## 2023-11-02 ENCOUNTER — Other Ambulatory Visit: Payer: Self-pay

## 2023-11-02 MED ORDER — FLUCONAZOLE 150 MG PO TABS
150.0000 mg | ORAL_TABLET | ORAL | 1 refills | Status: DC
  Filled 2023-11-02: qty 6, 84d supply, fill #0

## 2023-11-02 NOTE — Telephone Encounter (Signed)
 Medication refill request: diflucan   Last AEX:  01/04/23 Next AEX: 01/06/24 Last MMG (if hormonal medication request): n/a Refill authorized: please advise

## 2023-11-18 ENCOUNTER — Other Ambulatory Visit: Payer: Self-pay

## 2023-11-18 ENCOUNTER — Other Ambulatory Visit (HOSPITAL_COMMUNITY): Payer: Self-pay

## 2023-11-18 MED ORDER — ALPRAZOLAM 0.5 MG PO TABS
0.2500 mg | ORAL_TABLET | Freq: Two times a day (BID) | ORAL | 0 refills | Status: DC | PRN
  Filled 2023-11-18: qty 60, 30d supply, fill #0

## 2023-12-06 DIAGNOSIS — Z1283 Encounter for screening for malignant neoplasm of skin: Secondary | ICD-10-CM | POA: Diagnosis not present

## 2023-12-06 DIAGNOSIS — D225 Melanocytic nevi of trunk: Secondary | ICD-10-CM | POA: Diagnosis not present

## 2023-12-06 DIAGNOSIS — I821 Thrombophlebitis migrans: Secondary | ICD-10-CM | POA: Diagnosis not present

## 2023-12-27 ENCOUNTER — Other Ambulatory Visit (HOSPITAL_COMMUNITY): Payer: Self-pay

## 2024-01-06 ENCOUNTER — Encounter: Payer: Self-pay | Admitting: Nurse Practitioner

## 2024-01-06 ENCOUNTER — Ambulatory Visit (INDEPENDENT_AMBULATORY_CARE_PROVIDER_SITE_OTHER): Payer: 59 | Admitting: Nurse Practitioner

## 2024-01-06 ENCOUNTER — Other Ambulatory Visit: Payer: Self-pay

## 2024-01-06 ENCOUNTER — Other Ambulatory Visit (HOSPITAL_COMMUNITY): Payer: Self-pay

## 2024-01-06 VITALS — BP 118/78 | HR 81 | Ht 63.5 in | Wt 188.0 lb

## 2024-01-06 DIAGNOSIS — Z79890 Hormone replacement therapy (postmenopausal): Secondary | ICD-10-CM | POA: Diagnosis not present

## 2024-01-06 DIAGNOSIS — Z1331 Encounter for screening for depression: Secondary | ICD-10-CM | POA: Diagnosis not present

## 2024-01-06 DIAGNOSIS — N760 Acute vaginitis: Secondary | ICD-10-CM | POA: Diagnosis not present

## 2024-01-06 DIAGNOSIS — Z01419 Encounter for gynecological examination (general) (routine) without abnormal findings: Secondary | ICD-10-CM | POA: Diagnosis not present

## 2024-01-06 DIAGNOSIS — R300 Dysuria: Secondary | ICD-10-CM

## 2024-01-06 DIAGNOSIS — Z9189 Other specified personal risk factors, not elsewhere classified: Secondary | ICD-10-CM | POA: Diagnosis not present

## 2024-01-06 MED ORDER — ESTRADIOL 1 MG PO TABS
1.0000 mg | ORAL_TABLET | Freq: Every day | ORAL | 3 refills | Status: AC
  Filled 2024-01-06 – 2024-01-31 (×2): qty 90, 90d supply, fill #0
  Filled 2024-04-24: qty 90, 90d supply, fill #1
  Filled 2024-07-19: qty 90, 90d supply, fill #2

## 2024-01-06 MED ORDER — FLUCONAZOLE 150 MG PO TABS
150.0000 mg | ORAL_TABLET | ORAL | 1 refills | Status: DC
  Filled 2024-01-06 – 2024-01-31 (×2): qty 6, 84d supply, fill #0
  Filled 2024-04-24: qty 6, 84d supply, fill #1

## 2024-01-06 NOTE — Progress Notes (Signed)
 Nicole Oliver [birth year] [id number]   History:  58 y.o. G0 presents for annual exam. Complains of urgency and slight burning with urination x 3 days. No vaginal symptoms. Postmenopausal - on ERT. S/P 2005 TAH BSO for endometriosis and fibroids. Significant family history of breast cancer - Mother, sister, maternal aunt, maternal cousin and one niece all diagnosed in their 62s. Cousin and niece BRCA +. Patient not interested in genetic testing. Did MRIs years back but was told she did not need them anymore due to age. Normal pap history. H/O hypothyroidism. Takes Diflucan  every 14 days for management of recurrent yeast infections.   Gynecologic History Patient's last menstrual period was 06/23/2003.   Contraception/Family planning: status post hysterectomy Sexually active: Yes  Health Maintenance Last Pap: 12/30/2021. Results were: Normal Last mammogram: 03/09/2023. Results were: Normal Last colonoscopy: 2022, 3-year recall Last Dexa: Never     01/06/2024    3:06 PM  Depression screen PHQ 2/9  Decreased Interest 0  Down, Depressed, Hopeless 0  PHQ - 2 Score 0     Past medical history, past surgical history, family history and social history were all reviewed and documented in the EPIC chart. Married.  ROS:  A ROS was performed and pertinent positives and negatives are included.  Exam:  Vitals:   01/06/24 1457  BP: 118/78  Pulse: 81  SpO2: 98%  Weight: 188 lb (85.3 kg)  Height: 5' 3.5 (1.613 m)   Body mass index is 32.78 kg/m.  General appearance:  Normal Thyroid :  Symmetrical, normal in size, without palpable masses or nodularity. Respiratory  Auscultation:  Clear without wheezing or rhonchi Cardiovascular  Auscultation:  Regular rate, without rubs, murmurs or gallops  Edema/varicosities:  Not grossly evident Abdominal  Soft,nontender, without masses, guarding or rebound.  Liver/spleen:  No organomegaly noted  Hernia:  None appreciated  Skin  Inspection:   Grossly normal Breasts: Examined lying and sitting.   Right: Without masses, retractions, nipple discharge or axillary adenopathy.   Left: Without masses, retractions, nipple discharge or axillary adenopathy. Pelvic: External genitalia:  no lesions              Urethra:  normal appearing urethra with no masses, tenderness or lesions              Bartholins and Skenes: normal                 Vagina: normal appearing vagina with normal color and discharge, no lesions              Cervix: absent Bimanual Exam:  Uterus:  absent              Adnexa: no mass, fullness, tenderness              Rectovaginal: Deferred              Anus:  normal, no lesions  Dereck Keas, CMA present as chaperone.   UA negative  Assessment/Plan:  58 y.o. G0 for annual exam.   Well female exam with routine gynecological exam - Education provided on SBEs, importance of preventative screenings, current guidelines, high calcium diet, regular exercise, and multivitamin daily. Labs with PCP.   Postmenopausal hormone therapy - Plan: estradiol  (ESTRACE ) 1 MG tablet daily. Discussed increased risk of breast cancer with family history. Wants to continue.   Burning with urination - Plan: Urinalysis,Complete w/RFL Culture. Neg UA.   Recurrent vaginitis - Plan: fluconazole  (DIFLUCAN ) 150 MG tablet every 14 days.  At high risk for breast cancer - 23.7% lifetime risk for breast cancer. Significant family history, 2 family members with + BRCA mutation. Recommendations for annual breast MRI in conjunction with mammograms. UTD on mammogram.   Screening for cervical cancer - Normal Pap history.  Discussed current guidelines and option to stop screenings. Wants to continue. Will repeat at 3-year interval.   Screening for colon cancer - 2022 colonoscopy. Will repeat at 3-year interval per GI's recommendation.   Return in about 1 year (around 01/05/2025) for Annual.     Annabella Anatasia Shutter DNP, 3:35 PM 01/06/2024

## 2024-01-08 LAB — URINALYSIS, COMPLETE W/RFL CULTURE
Casts: NONE SEEN /LPF
Crystals: NONE SEEN /HPF
Glucose, UA: NEGATIVE
Hgb urine dipstick: NEGATIVE
Ketones, ur: NEGATIVE
Leukocyte Esterase: NEGATIVE
Nitrites, Initial: NEGATIVE
RBC / HPF: NONE SEEN /HPF (ref 0–2)
Specific Gravity, Urine: 1.025 (ref 1.001–1.035)
Yeast: NONE SEEN /HPF
pH: 5.5 (ref 5.0–8.0)

## 2024-01-08 LAB — URINE CULTURE
MICRO NUMBER:: 16712011
Result:: NO GROWTH
SPECIMEN QUALITY:: ADEQUATE

## 2024-01-08 LAB — CULTURE INDICATED

## 2024-01-10 ENCOUNTER — Ambulatory Visit: Payer: Self-pay | Admitting: Nurse Practitioner

## 2024-01-10 ENCOUNTER — Telehealth: Payer: Self-pay | Admitting: *Deleted

## 2024-01-10 DIAGNOSIS — Z9189 Other specified personal risk factors, not elsewhere classified: Secondary | ICD-10-CM

## 2024-01-10 NOTE — Telephone Encounter (Signed)
-----   Message from Annabella Adelina Shutter sent at 01/06/2024  3:34 PM EDT ----- Regarding: Breast MRI Please send order for breast MRI due to increased risk of 23.7%.

## 2024-01-10 NOTE — Telephone Encounter (Signed)
 Spoke with patient, advised per Tiffany. Order for breast MRI placed to [facility], patient to call to schedule. Reviewed PA process and benefits. Questions answered. Patient to call if any additional assistance needed.

## 2024-01-13 NOTE — Telephone Encounter (Signed)
 Per review of EPIC, MRI scheduled for 03/07/24.   Encounter closed.

## 2024-01-17 ENCOUNTER — Other Ambulatory Visit: Payer: Self-pay

## 2024-01-31 ENCOUNTER — Other Ambulatory Visit: Payer: Self-pay

## 2024-01-31 ENCOUNTER — Other Ambulatory Visit (HOSPITAL_COMMUNITY): Payer: Self-pay

## 2024-02-01 ENCOUNTER — Other Ambulatory Visit: Payer: Self-pay

## 2024-02-02 ENCOUNTER — Other Ambulatory Visit: Payer: Self-pay

## 2024-02-02 ENCOUNTER — Other Ambulatory Visit (HOSPITAL_COMMUNITY): Payer: Self-pay

## 2024-02-02 MED ORDER — ALPRAZOLAM 0.5 MG PO TABS
0.2500 mg | ORAL_TABLET | Freq: Two times a day (BID) | ORAL | 0 refills | Status: DC | PRN
  Filled 2024-02-02: qty 60, 30d supply, fill #0

## 2024-02-15 ENCOUNTER — Other Ambulatory Visit (HOSPITAL_COMMUNITY): Payer: Self-pay

## 2024-02-17 DIAGNOSIS — Z860101 Personal history of adenomatous and serrated colon polyps: Secondary | ICD-10-CM | POA: Diagnosis not present

## 2024-02-17 DIAGNOSIS — K640 First degree hemorrhoids: Secondary | ICD-10-CM | POA: Diagnosis not present

## 2024-02-17 DIAGNOSIS — D123 Benign neoplasm of transverse colon: Secondary | ICD-10-CM | POA: Diagnosis not present

## 2024-02-17 DIAGNOSIS — Z09 Encounter for follow-up examination after completed treatment for conditions other than malignant neoplasm: Secondary | ICD-10-CM | POA: Diagnosis not present

## 2024-03-07 ENCOUNTER — Other Ambulatory Visit

## 2024-03-12 ENCOUNTER — Ambulatory Visit
Admission: RE | Admit: 2024-03-12 | Discharge: 2024-03-12 | Disposition: A | Discharge status: Home / Self Care | Source: Ambulatory Visit | Attending: Nurse Practitioner | Admitting: Nurse Practitioner

## 2024-03-12 DIAGNOSIS — Z803 Family history of malignant neoplasm of breast: Secondary | ICD-10-CM | POA: Diagnosis not present

## 2024-03-12 DIAGNOSIS — Z9189 Other specified personal risk factors, not elsewhere classified: Secondary | ICD-10-CM

## 2024-03-12 MED ORDER — GADOPICLENOL 0.5 MMOL/ML IV SOLN
9.0000 mL | Freq: Once | INTRAVENOUS | Status: AC | PRN
  Administered 2024-03-12: 9 mL via INTRAVENOUS

## 2024-03-13 ENCOUNTER — Ambulatory Visit: Payer: Self-pay | Admitting: Nurse Practitioner

## 2024-03-20 ENCOUNTER — Other Ambulatory Visit: Payer: Self-pay

## 2024-04-10 DIAGNOSIS — I10 Essential (primary) hypertension: Secondary | ICD-10-CM | POA: Diagnosis not present

## 2024-04-10 DIAGNOSIS — Z0000 Encounter for general adult medical examination without abnormal findings: Secondary | ICD-10-CM | POA: Diagnosis not present

## 2024-04-10 DIAGNOSIS — F411 Generalized anxiety disorder: Secondary | ICD-10-CM | POA: Diagnosis not present

## 2024-04-10 DIAGNOSIS — R040 Epistaxis: Secondary | ICD-10-CM | POA: Diagnosis not present

## 2024-04-10 DIAGNOSIS — Z23 Encounter for immunization: Secondary | ICD-10-CM | POA: Diagnosis not present

## 2024-04-10 DIAGNOSIS — E782 Mixed hyperlipidemia: Secondary | ICD-10-CM | POA: Diagnosis not present

## 2024-04-10 DIAGNOSIS — E039 Hypothyroidism, unspecified: Secondary | ICD-10-CM | POA: Diagnosis not present

## 2024-04-10 DIAGNOSIS — R635 Abnormal weight gain: Secondary | ICD-10-CM | POA: Diagnosis not present

## 2024-04-10 DIAGNOSIS — E559 Vitamin D deficiency, unspecified: Secondary | ICD-10-CM | POA: Diagnosis not present

## 2024-04-10 DIAGNOSIS — R232 Flushing: Secondary | ICD-10-CM | POA: Diagnosis not present

## 2024-04-11 ENCOUNTER — Other Ambulatory Visit (HOSPITAL_COMMUNITY): Payer: Self-pay

## 2024-04-11 ENCOUNTER — Other Ambulatory Visit: Payer: Self-pay

## 2024-04-11 MED ORDER — FENOFIBRATE 160 MG PO TABS
160.0000 mg | ORAL_TABLET | Freq: Every day | ORAL | 1 refills | Status: AC
  Filled 2024-04-11: qty 90, 90d supply, fill #0
  Filled 2024-07-06: qty 90, 90d supply, fill #1

## 2024-04-11 MED ORDER — ALPRAZOLAM 0.5 MG PO TABS
0.5000 mg | ORAL_TABLET | Freq: Two times a day (BID) | ORAL | 0 refills | Status: DC | PRN
  Filled 2024-04-11: qty 60, 15d supply, fill #0

## 2024-04-24 ENCOUNTER — Encounter: Payer: Self-pay | Admitting: Radiology

## 2024-04-24 ENCOUNTER — Other Ambulatory Visit: Payer: Self-pay

## 2024-05-12 ENCOUNTER — Other Ambulatory Visit: Payer: Self-pay

## 2024-05-12 ENCOUNTER — Other Ambulatory Visit (HOSPITAL_COMMUNITY): Payer: Self-pay

## 2024-05-12 MED ORDER — LEVOTHYROXINE SODIUM 25 MCG PO TABS
25.0000 ug | ORAL_TABLET | Freq: Every morning | ORAL | 1 refills | Status: AC
  Filled 2024-05-12: qty 90, 90d supply, fill #0

## 2024-05-30 DIAGNOSIS — L570 Actinic keratosis: Secondary | ICD-10-CM | POA: Diagnosis not present

## 2024-05-30 DIAGNOSIS — M71371 Other bursal cyst, right ankle and foot: Secondary | ICD-10-CM | POA: Diagnosis not present

## 2024-05-30 DIAGNOSIS — L218 Other seborrheic dermatitis: Secondary | ICD-10-CM | POA: Diagnosis not present

## 2024-05-30 DIAGNOSIS — L603 Nail dystrophy: Secondary | ICD-10-CM | POA: Diagnosis not present

## 2024-05-30 DIAGNOSIS — L738 Other specified follicular disorders: Secondary | ICD-10-CM | POA: Diagnosis not present

## 2024-05-30 DIAGNOSIS — L821 Other seborrheic keratosis: Secondary | ICD-10-CM | POA: Diagnosis not present

## 2024-06-13 ENCOUNTER — Other Ambulatory Visit (HOSPITAL_COMMUNITY): Payer: Self-pay

## 2024-06-13 MED ORDER — OLMESARTAN MEDOXOMIL 20 MG PO TABS
20.0000 mg | ORAL_TABLET | Freq: Every day | ORAL | 3 refills | Status: AC
  Filled 2024-06-13: qty 90, 90d supply, fill #0

## 2024-06-14 ENCOUNTER — Other Ambulatory Visit (HOSPITAL_COMMUNITY): Payer: Self-pay

## 2024-06-14 ENCOUNTER — Other Ambulatory Visit (HOSPITAL_BASED_OUTPATIENT_CLINIC_OR_DEPARTMENT_OTHER): Payer: Self-pay

## 2024-06-14 MED ORDER — ALPRAZOLAM 0.5 MG PO TABS
0.2500 mg | ORAL_TABLET | Freq: Two times a day (BID) | ORAL | 0 refills | Status: AC | PRN
  Filled 2024-06-14: qty 60, 30d supply, fill #0

## 2024-06-26 ENCOUNTER — Telehealth: Admitting: Nurse Practitioner

## 2024-06-26 DIAGNOSIS — M545 Low back pain, unspecified: Secondary | ICD-10-CM | POA: Diagnosis not present

## 2024-06-26 MED ORDER — PREDNISONE 10 MG (21) PO TBPK: ORAL_TABLET | ORAL | 0 refills | Status: AC

## 2024-06-26 NOTE — Progress Notes (Signed)
 We are sorry that you are not feeling well.  Here is how we plan to help!  Based on what you have shared with me it, appears you may be experiencing acute back pain.   Acute back pain is defined as musculoskeletal pain that can resolve in 1-3 weeks with conservative treatment.  I have prescribed a prednisone  taper, please take each dose with food and avoid other anti-inflammatory medications (advil /ibuprofen ) while taking the prednisone . You can use tylenol  as needed for pain  Meds ordered this encounter  Medications   predniSONE  (STERAPRED UNI-PAK 21 TAB) 10 MG (21) TBPK tablet    Sig: Take 6 tablets on day one, 5 on day two, 4 on day three, 3 on day four, 2 on day five, and 1 on day six. Take with food.    Dispense:  21 tablet    Refill:  0       Back pain is very common.  The pain often gets better over time.  The cause of back pain is usually not dangerous.  Most people can learn to manage their back pain on their own.  Home Care Stay active.  Start with short walks on flat ground if you can.  Try to walk farther each day. Do not sit, drive or stand in one place for more than 30 minutes.  Do not stay in bed. Do not fully avoid exercise or work.  Activity can help your back heal faster. Be careful when you bend or lift an object.  Bend at your knees, keep the object close to you, and do not twist. Sleep on a firm mattress.  Lie on your side, and bend your knees.  If you lie on your back, put a pillow under your knees. Only take medicines as told by your doctor. Put ice on the injured area. Put ice in a plastic bag Place a towel between your skin and the bag Leave the ice on for 15-20 minutes, 3-4 times a day for the first 2-3 days.  After that, you can switch between ice and heat packs. Ask your doctor about back exercises or massage.   Get Help Right Way If: Your pain does not go away with rest and treatments given today. Your pain does not go away within 1 week. You have new  problems. You do not feel well. The pain spreads into your legs. You cannot control when you poop (bowel movement) or pee (urinate). You feel sick to your stomach (nauseous) or throw up (vomit). You have belly (abdominal) pain. You feel like you may pass out (faint). If you develop a fever.  Make Sure you: Understand these instructions. Continue to monitor your condition for any changes. Will get help right away if you are not doing well or get worse.  Your e-visit answers were reviewed by a board certified advanced clinical practitioner to complete your personal care plan.  Depending on the condition, your plan could have included both over the counter or prescription medications.  If there is a problem, please reply once you have received a response from your provider.  Your safety is important to us .  If you have drug allergies, check your prescription carefully.    You can use MyChart to ask questions about todays visit, request a non-urgent call back, or ask for a work or school excuse for 24 hours related to this e-Visit. If it has been greater than 24 hours you will need to follow up with your provider or  enter a new e-Visit to address those concerns.  You will get an e-mail in the next two days asking about your experience.  I hope that your e-visit has been valuable and will speed your recovery. Thank you for using e-visits.   I have spent 5 minutes in review of e-visit questionnaire, review and updating patient chart, medical decision making and response to patient.   Lauraine Kitty, FNP

## 2024-07-06 ENCOUNTER — Other Ambulatory Visit: Payer: Self-pay

## 2024-07-19 ENCOUNTER — Other Ambulatory Visit: Payer: Self-pay | Admitting: Nurse Practitioner

## 2024-07-19 ENCOUNTER — Other Ambulatory Visit: Payer: Self-pay

## 2024-07-19 ENCOUNTER — Other Ambulatory Visit (HOSPITAL_COMMUNITY): Payer: Self-pay

## 2024-07-19 DIAGNOSIS — N760 Acute vaginitis: Secondary | ICD-10-CM

## 2024-07-19 NOTE — Telephone Encounter (Signed)
 Med refill request:    fluconazole  (DIFLUCAN ) 150 MG tablet  Start:  01/06/24 Last dispensed:  04/24/24 Disp:  6 tablets Refills:  0 of 1 remaining  Last AEX:  01/06/24 Next AEX:  Not yet scheduled Last MMG (if hormonal med):  N/A Refill authorized? Please Advise. /25

## 2024-07-20 ENCOUNTER — Other Ambulatory Visit: Payer: Self-pay

## 2024-07-20 MED ORDER — FLUCONAZOLE 150 MG PO TABS
150.0000 mg | ORAL_TABLET | ORAL | 1 refills | Status: AC
  Filled 2024-07-20: qty 6, 84d supply, fill #0

## 2025-01-08 ENCOUNTER — Ambulatory Visit: Admitting: Nurse Practitioner
